# Patient Record
Sex: Male | Born: 1960 | Race: White | Hispanic: No | Smoking: Current some day smoker
Health system: Southern US, Community
[De-identification: ages and names within clinical notes are randomized; demographics above are authoritative.]

## PROBLEM LIST (undated history)

## (undated) DIAGNOSIS — Z87442 Personal history of urinary calculi: Secondary | ICD-10-CM

## (undated) DIAGNOSIS — E119 Type 2 diabetes mellitus without complications: Secondary | ICD-10-CM

## (undated) DIAGNOSIS — I6529 Occlusion and stenosis of unspecified carotid artery: Secondary | ICD-10-CM

## (undated) DIAGNOSIS — I2510 Atherosclerotic heart disease of native coronary artery without angina pectoris: Secondary | ICD-10-CM

## (undated) DIAGNOSIS — N400 Enlarged prostate without lower urinary tract symptoms: Secondary | ICD-10-CM

## (undated) DIAGNOSIS — I714 Abdominal aortic aneurysm, without rupture, unspecified: Secondary | ICD-10-CM

## (undated) DIAGNOSIS — I10 Essential (primary) hypertension: Secondary | ICD-10-CM

## (undated) DIAGNOSIS — I700 Atherosclerosis of aorta: Secondary | ICD-10-CM

## (undated) DIAGNOSIS — K760 Fatty (change of) liver, not elsewhere classified: Secondary | ICD-10-CM

## (undated) DIAGNOSIS — I771 Stricture of artery: Secondary | ICD-10-CM

## (undated) DIAGNOSIS — J449 Chronic obstructive pulmonary disease, unspecified: Secondary | ICD-10-CM

## (undated) DIAGNOSIS — N200 Calculus of kidney: Secondary | ICD-10-CM

## (undated) DIAGNOSIS — K5790 Diverticulosis of intestine, part unspecified, without perforation or abscess without bleeding: Secondary | ICD-10-CM

## (undated) DIAGNOSIS — K219 Gastro-esophageal reflux disease without esophagitis: Secondary | ICD-10-CM

## (undated) DIAGNOSIS — N529 Male erectile dysfunction, unspecified: Secondary | ICD-10-CM

## (undated) DIAGNOSIS — M1990 Unspecified osteoarthritis, unspecified site: Secondary | ICD-10-CM

## (undated) DIAGNOSIS — E785 Hyperlipidemia, unspecified: Secondary | ICD-10-CM

## (undated) DIAGNOSIS — I7143 Infrarenal abdominal aortic aneurysm, without rupture: Secondary | ICD-10-CM

## (undated) HISTORY — DX: Chronic obstructive pulmonary disease, unspecified: J44.9

## (undated) HISTORY — PX: VASECTOMY: SHX75

## (undated) HISTORY — PX: FACIAL COSMETIC SURGERY: SHX629

## (undated) HISTORY — DX: Occlusion and stenosis of unspecified carotid artery: I65.29

## (undated) HISTORY — DX: Hyperlipidemia, unspecified: E78.5

## (undated) HISTORY — PX: APPENDECTOMY: SHX54

## (undated) HISTORY — PX: CARDIAC CATHETERIZATION: SHX172

## (undated) HISTORY — DX: Essential (primary) hypertension: I10

## (undated) HISTORY — DX: Calculus of kidney: N20.0

## (undated) HISTORY — DX: Type 2 diabetes mellitus without complications: E11.9

## (undated) HISTORY — DX: Gastro-esophageal reflux disease without esophagitis: K21.9

## (undated) HISTORY — DX: Benign prostatic hyperplasia without lower urinary tract symptoms: N40.0

## (undated) HISTORY — PX: LITHOTRIPSY: SUR834

## (undated) HISTORY — PX: CORONARY ANGIOPLASTY: SHX604

---

## 2012-09-25 ENCOUNTER — Emergency Department: Payer: Self-pay | Admitting: Emergency Medicine

## 2012-09-25 LAB — CBC WITH DIFFERENTIAL/PLATELET
Basophil #: 0 10*3/uL (ref 0.0–0.1)
Basophil %: 0.2 %
Eosinophil %: 1.8 %
HCT: 55.7 % — ABNORMAL HIGH (ref 40.0–52.0)
Lymphocyte #: 1.3 10*3/uL (ref 1.0–3.6)
Lymphocyte %: 9.6 %
MCH: 31.5 pg (ref 26.0–34.0)
MCHC: 33.9 g/dL (ref 32.0–36.0)
MCV: 93 fL (ref 80–100)
Monocyte #: 0.5 x10 3/mm (ref 0.2–1.0)
Monocyte %: 3.5 %
Neutrophil #: 11.3 10*3/uL — ABNORMAL HIGH (ref 1.4–6.5)
Neutrophil %: 84.9 %
Platelet: 256 10*3/uL (ref 150–440)
RBC: 5.99 10*6/uL — ABNORMAL HIGH (ref 4.40–5.90)
RDW: 13.8 % (ref 11.5–14.5)

## 2012-09-25 LAB — COMPREHENSIVE METABOLIC PANEL
Albumin: 3.9 g/dL (ref 3.4–5.0)
Bilirubin,Total: 0.6 mg/dL (ref 0.2–1.0)
Co2: 26 mmol/L (ref 21–32)
EGFR (African American): >60
EGFR (Non-African Amer.): >60
Osmolality: 271 (ref 275–301)
Potassium: 3.7 mmol/L (ref 3.5–5.1)
SGOT(AST): 24 U/L (ref 15–37)
Total Protein: 8.2 g/dL (ref 6.4–8.2)

## 2012-09-25 LAB — URINALYSIS, COMPLETE
Bacteria: NONE SEEN
Glucose,UR: NEGATIVE mg/dL (ref 0–75)
Leukocyte Esterase: NEGATIVE
Nitrite: NEGATIVE
Ph: 5 (ref 4.5–8.0)
Protein: NEGATIVE
Specific Gravity: 1.02 (ref 1.003–1.030)
Squamous Epithelial: NONE SEEN

## 2013-12-01 DIAGNOSIS — E785 Hyperlipidemia, unspecified: Secondary | ICD-10-CM

## 2013-12-01 DIAGNOSIS — K219 Gastro-esophageal reflux disease without esophagitis: Secondary | ICD-10-CM

## 2013-12-01 DIAGNOSIS — N400 Enlarged prostate without lower urinary tract symptoms: Secondary | ICD-10-CM | POA: Insufficient documentation

## 2013-12-01 HISTORY — DX: Gastro-esophageal reflux disease without esophagitis: K21.9

## 2013-12-01 HISTORY — DX: Benign prostatic hyperplasia without lower urinary tract symptoms: N40.0

## 2013-12-01 HISTORY — DX: Hyperlipidemia, unspecified: E78.5

## 2014-03-25 DIAGNOSIS — J449 Chronic obstructive pulmonary disease, unspecified: Secondary | ICD-10-CM

## 2014-03-25 HISTORY — DX: Chronic obstructive pulmonary disease, unspecified: J44.9

## 2015-01-28 ENCOUNTER — Emergency Department
Admission: EM | Admit: 2015-01-28 | Discharge: 2015-01-28 | Disposition: A | Discharge status: Home / Self Care | Payer: BLUE CROSS/BLUE SHIELD | Attending: Emergency Medicine | Admitting: Emergency Medicine

## 2015-01-28 ENCOUNTER — Emergency Department: Payer: BLUE CROSS/BLUE SHIELD | Admitting: Anesthesiology

## 2015-01-28 ENCOUNTER — Encounter: Payer: Self-pay | Admitting: Emergency Medicine

## 2015-01-28 ENCOUNTER — Encounter
Admission: EM | Disposition: A | Discharge status: Home / Self Care | Payer: Self-pay | Source: Home / Self Care | Attending: Emergency Medicine

## 2015-01-28 ENCOUNTER — Emergency Department: Payer: BLUE CROSS/BLUE SHIELD

## 2015-01-28 DIAGNOSIS — R112 Nausea with vomiting, unspecified: Secondary | ICD-10-CM | POA: Insufficient documentation

## 2015-01-28 DIAGNOSIS — I714 Abdominal aortic aneurysm, without rupture: Secondary | ICD-10-CM | POA: Diagnosis not present

## 2015-01-28 DIAGNOSIS — F17210 Nicotine dependence, cigarettes, uncomplicated: Secondary | ICD-10-CM | POA: Insufficient documentation

## 2015-01-28 DIAGNOSIS — I739 Peripheral vascular disease, unspecified: Secondary | ICD-10-CM | POA: Insufficient documentation

## 2015-01-28 DIAGNOSIS — M25552 Pain in left hip: Secondary | ICD-10-CM

## 2015-01-28 DIAGNOSIS — K760 Fatty (change of) liver, not elsewhere classified: Secondary | ICD-10-CM | POA: Diagnosis not present

## 2015-01-28 DIAGNOSIS — F17200 Nicotine dependence, unspecified, uncomplicated: Secondary | ICD-10-CM | POA: Insufficient documentation

## 2015-01-28 DIAGNOSIS — I10 Essential (primary) hypertension: Secondary | ICD-10-CM | POA: Diagnosis not present

## 2015-01-28 DIAGNOSIS — N179 Acute kidney failure, unspecified: Secondary | ICD-10-CM | POA: Diagnosis not present

## 2015-01-28 DIAGNOSIS — N132 Hydronephrosis with renal and ureteral calculous obstruction: Secondary | ICD-10-CM | POA: Insufficient documentation

## 2015-01-28 DIAGNOSIS — Z87442 Personal history of urinary calculi: Secondary | ICD-10-CM | POA: Insufficient documentation

## 2015-01-28 DIAGNOSIS — N200 Calculus of kidney: Secondary | ICD-10-CM | POA: Diagnosis not present

## 2015-01-28 DIAGNOSIS — R109 Unspecified abdominal pain: Secondary | ICD-10-CM | POA: Insufficient documentation

## 2015-01-28 DIAGNOSIS — N201 Calculus of ureter: Secondary | ICD-10-CM

## 2015-01-28 DIAGNOSIS — E785 Hyperlipidemia, unspecified: Secondary | ICD-10-CM | POA: Insufficient documentation

## 2015-01-28 HISTORY — DX: Essential (primary) hypertension: I10

## 2015-01-28 HISTORY — PX: CYSTOSCOPY W/ URETERAL STENT PLACEMENT: SHX1429

## 2015-01-28 HISTORY — DX: Abdominal aortic aneurysm, without rupture, unspecified: I71.40

## 2015-01-28 HISTORY — DX: Abdominal aortic aneurysm, without rupture: I71.4

## 2015-01-28 LAB — COMPREHENSIVE METABOLIC PANEL
ALK PHOS: 32 U/L — AB (ref 38–126)
ALT: 29 U/L (ref 17–63)
ANION GAP: 9 (ref 5–15)
AST: 25 U/L (ref 15–41)
Albumin: 3.9 g/dL (ref 3.5–5.0)
BILIRUBIN TOTAL: 0.5 mg/dL (ref 0.3–1.2)
BUN: 24 mg/dL — AB (ref 6–20)
CHLORIDE: 102 mmol/L (ref 101–111)
CO2: 23 mmol/L (ref 22–32)
Calcium: 8.8 mg/dL — ABNORMAL LOW (ref 8.9–10.3)
Creatinine, Ser: 2.43 mg/dL — ABNORMAL HIGH (ref 0.61–1.24)
GFR, EST AFRICAN AMERICAN: 33 mL/min — AB (ref >60)
GFR, EST NON AFRICAN AMERICAN: 29 mL/min — AB (ref >60)
GLUCOSE: 113 mg/dL — AB (ref 65–99)
POTASSIUM: 3.9 mmol/L (ref 3.5–5.1)
Sodium: 134 mmol/L — ABNORMAL LOW (ref 135–145)
TOTAL PROTEIN: 7.3 g/dL (ref 6.5–8.1)

## 2015-01-28 LAB — CBC WITH DIFFERENTIAL/PLATELET
Basophils Absolute: 0.1 10*3/uL (ref 0–0.1)
Basophils Relative: 1 %
EOS ABS: 0.3 10*3/uL (ref 0–0.7)
Eosinophils Relative: 2 %
HCT: 43.9 % (ref 40.0–52.0)
Hemoglobin: 14.7 g/dL (ref 13.0–18.0)
LYMPHS ABS: 2.5 10*3/uL (ref 1.0–3.6)
Lymphocytes Relative: 17 %
MCH: 31.1 pg (ref 26.0–34.0)
MCHC: 33.5 g/dL (ref 32.0–36.0)
MCV: 92.8 fL (ref 80.0–100.0)
MONO ABS: 1.3 10*3/uL — AB (ref 0.2–1.0)
MONOS PCT: 9 %
NEUTROS ABS: 10.9 10*3/uL — AB (ref 1.4–6.5)
NEUTROS PCT: 71 %
Platelets: 312 10*3/uL (ref 150–440)
RBC: 4.74 MIL/uL (ref 4.40–5.90)
RDW: 13.5 % (ref 11.5–14.5)
WBC: 15.1 10*3/uL — AB (ref 3.8–10.6)

## 2015-01-28 LAB — URINALYSIS COMPLETE WITH MICROSCOPIC (ARMC ONLY)
Bacteria, UA: NONE SEEN
Bilirubin Urine: NEGATIVE
Glucose, UA: NEGATIVE mg/dL
Hgb urine dipstick: NEGATIVE
KETONES UR: NEGATIVE mg/dL
Leukocytes, UA: NEGATIVE
Nitrite: NEGATIVE
PH: 5 (ref 5.0–8.0)
Protein, ur: NEGATIVE mg/dL
SQUAMOUS EPITHELIAL / LPF: NONE SEEN
Specific Gravity, Urine: 1.016 (ref 1.005–1.030)

## 2015-01-28 SURGERY — CYSTOSCOPY, WITH RETROGRADE PYELOGRAM AND URETERAL STENT INSERTION
Anesthesia: General | Site: Ureter | Laterality: Left | Wound class: Dirty or Infected

## 2015-01-28 MED ORDER — MORPHINE SULFATE 4 MG/ML IJ SOLN
4.0000 mg | Freq: Once | INTRAMUSCULAR | Status: AC
  Administered 2015-01-28: 4 mg via INTRAVENOUS

## 2015-01-28 MED ORDER — KETOROLAC TROMETHAMINE 30 MG/ML IJ SOLN
INTRAMUSCULAR | Status: DC | PRN
  Administered 2015-01-28: 30 mg via INTRAVENOUS

## 2015-01-28 MED ORDER — CEFTRIAXONE SODIUM IN DEXTROSE 20 MG/ML IV SOLN: 1.0000 g | Freq: Once | INTRAVENOUS | Status: DC

## 2015-01-28 MED ORDER — SENNOSIDES-DOCUSATE SODIUM 8.6-50 MG PO TABS: 1.0000 | ORAL_TABLET | Freq: Two times a day (BID) | ORAL | Status: DC

## 2015-01-28 MED ORDER — ONDANSETRON HCL 4 MG/2ML IJ SOLN
INTRAMUSCULAR | Status: DC | PRN
  Administered 2015-01-28: 4 mg via INTRAVENOUS

## 2015-01-28 MED ORDER — FENTANYL CITRATE (PF) 100 MCG/2ML IJ SOLN: 25.0000 ug | INTRAMUSCULAR | Status: DC | PRN

## 2015-01-28 MED ORDER — MIDAZOLAM HCL 2 MG/2ML IJ SOLN
INTRAMUSCULAR | Status: DC | PRN
  Administered 2015-01-28: 2 mg via INTRAVENOUS

## 2015-01-28 MED ORDER — ONDANSETRON HCL 4 MG/2ML IJ SOLN
INTRAMUSCULAR | Status: AC
  Administered 2015-01-28: 4 mg via INTRAVENOUS
  Filled 2015-01-28: qty 2

## 2015-01-28 MED ORDER — LIDOCAINE HCL (CARDIAC) 20 MG/ML IV SOLN
INTRAVENOUS | Status: DC | PRN
Start: 2015-01-28 — End: 2015-01-28
  Administered 2015-01-28: 30 mg via INTRAVENOUS

## 2015-01-28 MED ORDER — OXYCODONE-ACETAMINOPHEN 5-325 MG PO TABS: 1.0000 | ORAL_TABLET | Freq: Four times a day (QID) | ORAL | Status: DC | PRN

## 2015-01-28 MED ORDER — LACTATED RINGERS IV SOLN
INTRAVENOUS | Status: DC | PRN
  Administered 2015-01-28: 17:00:00 via INTRAVENOUS

## 2015-01-28 MED ORDER — ONDANSETRON HCL 4 MG/2ML IJ SOLN
4.0000 mg | Freq: Once | INTRAMUSCULAR | Status: AC
  Administered 2015-01-28: 4 mg via INTRAVENOUS

## 2015-01-28 MED ORDER — SODIUM CHLORIDE 0.9 % IV BOLUS (SEPSIS)
1000.0000 mL | Freq: Once | INTRAVENOUS | Status: AC
  Administered 2015-01-28: 1000 mL via INTRAVENOUS

## 2015-01-28 MED ORDER — PROPOFOL 10 MG/ML IV BOLUS
INTRAVENOUS | Status: DC | PRN
  Administered 2015-01-28: 160 mg via INTRAVENOUS

## 2015-01-28 MED ORDER — CEFTRIAXONE SODIUM 1 G IJ SOLR
INTRAMUSCULAR | Status: AC
  Filled 2015-01-28: qty 10

## 2015-01-28 MED ORDER — ONDANSETRON HCL 4 MG PO TABS: 4.0000 mg | ORAL_TABLET | Freq: Three times a day (TID) | ORAL | Status: DC | PRN

## 2015-01-28 MED ORDER — MORPHINE SULFATE 4 MG/ML IJ SOLN
INTRAMUSCULAR | Status: AC
  Administered 2015-01-28: 4 mg via INTRAVENOUS
  Filled 2015-01-28: qty 1

## 2015-01-28 MED ORDER — FENTANYL CITRATE (PF) 100 MCG/2ML IJ SOLN
INTRAMUSCULAR | Status: DC | PRN
  Administered 2015-01-28: 100 ug via INTRAVENOUS

## 2015-01-28 MED ORDER — CEFTRIAXONE SODIUM 1 G IJ SOLR
1.0000 g | INTRAMUSCULAR | Status: DC | PRN
  Administered 2015-01-28: 1 g via INTRAVENOUS

## 2015-01-28 MED ORDER — ONDANSETRON HCL 4 MG/2ML IJ SOLN: 4.0000 mg | Freq: Once | INTRAMUSCULAR | Status: DC | PRN

## 2015-01-28 SURGICAL SUPPLY — 24 items
BAG DRAIN CYSTO-URO LG1000N (MISCELLANEOUS) ×4 IMPLANT
CATH URETL OPEN END 6X70 (CATHETERS) ×4 IMPLANT
CONRAY 43 FOR UROLOGY 50M (MISCELLANEOUS) ×4 IMPLANT
GLOVE BIO SURGEON STRL SZ7 (GLOVE) ×8 IMPLANT
GLOVE BIO SURGEON STRL SZ7.5 (GLOVE) ×4 IMPLANT
GOWN STRL REUS W/ TWL LRG LVL3 (GOWN DISPOSABLE) ×4 IMPLANT
GOWN STRL REUS W/ TWL XL LVL3 (GOWN DISPOSABLE) ×2 IMPLANT
GOWN STRL REUS W/TWL LRG LVL3 (GOWN DISPOSABLE) ×4
GOWN STRL REUS W/TWL XL LVL3 (GOWN DISPOSABLE) ×2
JELLY LUB 2OZ STRL (MISCELLANEOUS) ×2
JELLY LUBE 2OZ STRL (MISCELLANEOUS) ×2 IMPLANT
KIT RM TURNOVER CYSTO AR (KITS) ×4 IMPLANT
PACK CYSTO AR (MISCELLANEOUS) ×4 IMPLANT
PREP PVP WINGED SPONGE (MISCELLANEOUS) ×4 IMPLANT
SENSORWIRE 0.038 NOT ANGLED (WIRE) ×4
SOL .9 NS 3000ML IRR  AL (IV SOLUTION) ×2
SOL .9 NS 3000ML IRR UROMATIC (IV SOLUTION) ×2 IMPLANT
STENT URET 6FRX26 CONTOUR (STENTS) ×4 IMPLANT
SYRINGE 10CC LL (SYRINGE) ×4 IMPLANT
SYRINGE IRR TOOMEY STRL 70CC (SYRINGE) ×4 IMPLANT
TUBE FEED INF 8F (TUBING) IMPLANT
WATER STERILE IRR 1000ML POUR (IV SOLUTION) ×4 IMPLANT
WIRE SENSOR 0.038 NOT ANGLED (WIRE) ×2 IMPLANT
contour stent 6frx26cm ×4 IMPLANT

## 2015-01-28 NOTE — Anesthesia Preprocedure Evaluation (Addendum)
Anesthesia Evaluation  Patient identified by MRN, date of birth, ID band Patient awake    Reviewed: Allergy & Precautions, NPO status , Patient's Chart, lab work & pertinent test results, reviewed documented beta blocker date and time   Airway Mallampati: II  TM Distance: >3 FB     Dental  (+) Chipped, Poor Dentition   Pulmonary Current Smoker,          Cardiovascular hypertension, + Peripheral Vascular Disease     Neuro/Psych    GI/Hepatic   Endo/Other    Renal/GU Renal InsufficiencyRenal disease     Musculoskeletal   Abdominal   Peds  Hematology   Anesthesia Other Findings Pt has been NPO for 9 hrs. Feels hungry at present. Does not feel nauseated. LMA should be safe.  Reproductive/Obstetrics                            Anesthesia Physical Anesthesia Plan  ASA: III  Anesthesia Plan: General   Post-op Pain Management:    Induction: Intravenous  Airway Management Planned: LMA  Additional Equipment:   Intra-op Plan:   Post-operative Plan:   Informed Consent: I have reviewed the patients History and Physical, chart, labs and discussed the procedure including the risks, benefits and alternatives for the proposed anesthesia with the patient or authorized representative who has indicated his/her understanding and acceptance.     Plan Discussed with: CRNA  Anesthesia Plan Comments:        Anesthesia Quick Evaluation

## 2015-01-28 NOTE — OR Nursing (Signed)
Patient in phase two of recovery care

## 2015-01-28 NOTE — ED Notes (Signed)
Pt sleeping soundly - awaiting admit bed

## 2015-01-28 NOTE — Discharge Instructions (Signed)
1 - You may have urinary urgency (bladder spasms) and bloody urine on / off with stent in place. This is normal. ° °2 - Call MD or go to ER for fever >102, severe pain / nausea / vomiting not relieved by medications, or acute change in medical status ° °

## 2015-01-28 NOTE — Brief Op Note (Signed)
01/28/2015  5:02 PM  PATIENT:  [name]  54 y.o. male  PRE-OPERATIVE DIAGNOSIS:  left stone, renal failure  POST-OPERATIVE DIAGNOSIS:  same  PROCEDURE:  Procedure(s): CYSTOSCOPY WITH RETROGRADE PYELOGRAM/URETERAL STENT PLACEMENT (Left)  SURGEON:  Surgeon(s) and Role:    * Alexis Frock, MD - Primary  PHYSICIAN ASSISTANT:   ASSISTANTS: none   ANESTHESIA:   general  EBL:  Total I/O In: 350 [I.V.:350] Out: -   BLOOD ADMINISTERED:none  DRAINS: none   LOCAL MEDICATIONS USED:  NONE  SPECIMEN:  No Specimen  DISPOSITION OF SPECIMEN:  N/A  COUNTS:  YES  TOURNIQUET:  * No tourniquets in log *  DICTATION: .Note written in EPIC  PLAN OF CARE: Discharge to home after PACU  PATIENT DISPOSITION:  PACU - hemodynamically stable.   Delay start of Pharmacological VTE agent (>24hrs) due to surgical blood loss or risk of bleeding: not applicable

## 2015-01-28 NOTE — ED Provider Notes (Signed)
[facility] Emergency Department Provider Note  Time seen: 11:59 AM  I have reviewed the triage vital signs and the nursing notes.   HISTORY  Chief Complaint Flank Pain    HPI [name] is a 54 y.o. male with a past medical history of hypertension, AAA, kidney stone in 1993, presents the emergency department with 3 weeks of left flank pain. According to the patient for the past 3 weeks he has been dealing with a fairly constant left flank pain. Denies dysuria, hematuria or fever. He saw his primary care doctor 2 weeks ago for the same issue, was told it was likely a kidney stone as she had trace blood in his urine. Patient was told to drink plenty of fluids. Patient states he has been drinking plenty of fluids that the pain has not resolved, if anything it has worsened. Patient came to the emergency department for further evaluation. Describes his pain currently is a 7/10, moderate, sharp in the left flank radiating to the left groin. No modifying factors identified. Patient does take considerable nausea this morning but denies vomiting.Of note the patient also has a known abdominal aortic aneurysm which they are currently monitoring, but per the patient it does not require an intervention.     Past Medical History  Diagnosis Date  . Hypertension   . AAA (abdominal aortic aneurysm)     There are no active problems to display for this patient.   Past Surgical History  Procedure Laterality Date  . Lithotripsy    . Facial cosmetic surgery    . Appendectomy      No current outpatient prescriptions on file.  Allergies Review of patient's allergies indicates no known allergies.  No family history on file.  Social History History  Substance Use Topics  . Smoking status: Current Every Day Smoker    Types: Cigarettes  . Smokeless tobacco: Not on file  . Alcohol Use: No    Review of Systems Constitutional: Negative for fever. Cardiovascular:  Negative for chest pain. Respiratory: Negative for shortness of breath. Gastrointestinal: Positive for left flank pain and nausea. Negative for vomiting or diarrhea. Genitourinary: Negative for dysuria. Negative for hematuria. Musculoskeletal: Positive for left back pain. 10-point ROS otherwise negative.  ____________________________________________   PHYSICAL EXAM:  VITAL SIGNS: ED Triage Vitals  Enc Vitals Group     BP 01/28/15 1148 131/90 mmHg     Pulse Rate 01/28/15 1148 100     Resp 01/28/15 1148 22     Temp 01/28/15 1148 98.6 F (37 C)     Temp Source 01/28/15 1148 Oral     SpO2 01/28/15 1148 99 %     Weight 01/28/15 1148 220 lb (99.791 kg)     Height 01/28/15 1148 5\' 8"  (1.727 m)     Head Cir --      Peak Flow --      Pain Score 01/28/15 1150 8     Pain Loc --      Pain Edu? --      Excl. in [place]? --     Constitutional: Alert and oriented. Well appearing and in no distress. Eyes: Normal exam ENT   Mouth/Throat: Mucous membranes are moist. Cardiovascular: Normal rate, regular rhythm. No murmur Respiratory: Normal respiratory effort without tachypnea nor retractions. Breath sounds are clear and equal bilaterally. No wheezes/rales/rhonchi. Gastrointestinal: Soft and nontender. No distention.  There is no CVA tenderness. Musculoskeletal: Nontender with normal range of motion in all extremities.  Neurologic:  Normal speech and language. No gross focal neurologic deficits Skin:  Skin is warm, dry and intact.  Psychiatric: Mood and affect are normal. Speech and behavior are normal.   ____________________________________________    INITIAL IMPRESSION / ASSESSMENT AND PLAN / ED COURSE  Pertinent labs & imaging results that were available during my care of the patient were reviewed by me and considered in my medical decision making (see chart for details).  Patient with 3 weeks of left flank pain, he describes his symptoms as "kidney pain." States this feels like  his prior kidney stone in 1993 which required a procedure to remove the stone. Patient also has a history of a AAA which they're monitoring. We will check labs, and a CT scan to evaluate for ureteral lithiasis. We will treat patient's pain and nausea, and IV hydrate while awaiting results.  Urinalysis appears largely within normal limits. Creatinine has increased some 0.9 to 2.4. I discussed the patient with urology, they'll be coming in to stent the patient. I discussed this with the patient who wishes to have the stent performed today given his acute increase in pain and nausea today.  ____________________________________________   FINAL CLINICAL IMPRESSION(S) / ED DIAGNOSES  Left flank pain Ureterolithiasis Acute renal insufficiency   Harvest Dark, MD 01/28/15 1512

## 2015-01-28 NOTE — ED Notes (Signed)
Pt here with c/o left flank pain that radiates into left side of abdomen, hx of kidney stones in 1993. Pt has been having this pain x3 weeks, talked to PCP on Thursday, given rx for oxycodone 5/325 with no relief. Pt with hx of HTN and small AAA.

## 2015-01-28 NOTE — ED Notes (Signed)
Patient transported to CT 

## 2015-01-28 NOTE — Transfer of Care (Signed)
Immediate Anesthesia Transfer of Care Note  Patient: [name]  Procedure(s) Performed: Procedure(s): CYSTOSCOPY WITH RETROGRADE PYELOGRAM/URETERAL STENT PLACEMENT (Left)  Patient Location: PACU  Anesthesia Type:General  Level of Consciousness: awake, alert  and oriented  Airway & Oxygen Therapy: Patient Spontanous Breathing and Patient connected to face mask oxygen  Post-op Assessment: Report given to RN and Post -op Vital signs reviewed and stable  Post vital signs: Reviewed  Last Vitals:  Filed Vitals:   01/28/15 1430  BP: 103/69  Pulse: 85  Temp:   Resp:     Complications: No apparent anesthesia complications

## 2015-01-28 NOTE — Anesthesia Postprocedure Evaluation (Signed)
  Anesthesia Post-op Note  Patient: [name]  Procedure(s) Performed: Procedure(s): CYSTOSCOPY WITH RETROGRADE PYELOGRAM/URETERAL STENT PLACEMENT (Left)  Anesthesia type:General  Patient location: PACU  Post pain: Pain level controlled. Pt feeling well.  Post assessment: Post-op Vital signs reviewed, Patient's Cardiovascular Status Stable, Respiratory Function Stable, Patent Airway and No signs of Nausea or vomiting  Post vital signs: Reviewed and stable  Last Vitals:  Filed Vitals:   01/28/15 1807  BP:   Pulse: 100  Temp: 37.1 C  Resp:     Level of consciousness: awake, alert  and patient cooperative  Complications: No apparent anesthesia complications

## 2015-01-28 NOTE — H&P (Signed)
HISTORY AND PHYSICAL  3:39 PM   [name] Sep 08, 1960 [id number]  Referring provider: No referring provider defined for this encounter.  Chief Complaint  Patient presents with  . Flank Pain    HPI:   1 - Left Ureteral Stone with Hydronephrosis and Refracotry Pain, Bilateral Renal Stones - left 5x41mm mid ureteral stone with mod hdyro by ER CT 02/20/43 on eval colickly left flank pain that is now severe and with nausea / emesis refractory to several rounds of IV pain and nausea meds. Stone 66mm, SSD 12cm, 675HU. There is somewhat calcified AAA nearby. Also ipsilateral 47mm upper poel x 2 and 30mm lower pole on obatructing and contralateras 61mm lwoer pole.    2 - Acute Renal Failure - Cr 2.45 by ER labs, baseline Cr <1. No hyperkalemia. Admits to nausea with emesis x fe days with poor PO intake and left ureteral atone with unilateral hydro as per above. Approx normal appearing contarlateral kidney.  PMH sig for AAA (<5cm, on surveillance), HLD. Non-diabetic. Prior surgeries inclued ureteroscopy 1993, appy. No strong blood thinners.  Today "[name]" is seen as ER consult / eval for above. Last meal >7hrs ago and acutally threw that up.     PMH: Past Medical History  Diagnosis Date  . Hypertension   . AAA (abdominal aortic aneurysm)     Surgical History: Past Surgical History  Procedure Laterality Date  . Lithotripsy    . Facial cosmetic surgery    . Appendectomy      Home Medications:    Medication List    ASK your doctor about these medications        atorvastatin 10 MG tablet  Commonly known as:  LIPITOR  Take 10 mg by mouth every morning.     COMBIVENT RESPIMAT 20-100 MCG/ACT Aers respimat  Generic drug:  Ipratropium-Albuterol  Inhale 1 puff into the lungs 4 (four) times daily as needed.     fenofibrate 145 MG tablet  Commonly known as:  TRICOR  Take 145 mg by mouth daily.     lisinopril 10 MG tablet  Commonly known as:  PRINIVIL,ZESTRIL  Take 10 mg by  mouth daily.     oxyCODONE-acetaminophen 5-325 MG per tablet  Commonly known as:  PERCOCET/ROXICET  Take 1 tablet by mouth every 6 (six) hours as needed. For pain.     tamsulosin 0.4 MG Caps capsule  Commonly known as:  FLOMAX  Take 0.4 mg by mouth daily.        Allergies: No Known Allergies  Family History: No family history on file.  Social History:  reports that he has been smoking Cigarettes.  He does not have any smokeless tobacco history on file. He reports that he does not drink alcohol. His drug history is not on file.   Review of Systems   Urological Symptom Review  Left flank pain   Review of Systems  Gastrointestinal (upper)  : Nausea Vomiting  Gastrointestinal (lower) : Negative for lower GI symptoms  Constitutional : Negative for symptoms  Skin: Negative for skin symptoms  Eyes: Negative for eye symptoms  Ear/Nose/Throat : Negative for Ear/Nose/Throat symptoms  Hematologic/Lymphatic: Negative for Hematologic/Lymphatic symptoms  Cardiovascular : Negative for cardiovascular symptoms  Respiratory : Negative for respiratory symptoms  Endocrine: Negative for endocrine symptoms  Musculoskeletal: Negative for musculoskeletal symptoms  Neurological: Negative for neurological symptoms  Psychologic: Negative for psychiatric symptoms    Physical Exam: BP 103/69 mmHg  Pulse 85  Temp(Src) 98.6  F (37 C) (Oral)  Resp 22  Ht 5\' 8"  (1.727 m)  Wt 220 lb (99.791 kg)  BMI 33.46 kg/m2  SpO2 90%  Constitutional:  Alert and oriented, No acute distress. HEENT: [place] AT, moist mucus membranes.  Trachea midline, no masses. Cardiovascular: No clubbing, cyanosis, or edema. Respiratory: Normal respiratory effort, no increased work of breathing. GI: Abdomen is soft, nontender, nondistended, no abdominal masses GU: Left CVAT with radiation to groin.  Skin: No rashes, bruises or suspicious lesions. Lymph: No cervical or inguinal  adenopathy. Neurologic: Grossly intact, no focal deficits, moving all 4 extremities. Psychiatric: Normal mood and affect.  Laboratory Data: Lab Results  Component Value Date   WBC 15.1* 01/28/2015   HGB 14.7 01/28/2015   HCT 43.9 01/28/2015   MCV 92.8 01/28/2015   PLT 312 01/28/2015    Lab Results  Component Value Date   CREATININE 2.43* 01/28/2015    No results found for: PSA  No results found for: TESTOSTERONE  No results found for: HGBA1C  Urinalysis    Component Value Date/Time   COLORURINE YELLOW* 01/28/2015 1353   APPEARANCEUR CLEAR* 01/28/2015 1353   LABSPEC 1.016 01/28/2015 1353   PHURINE 5.0 01/28/2015 1353   GLUCOSEU NEGATIVE 01/28/2015 1353   HGBUR NEGATIVE 01/28/2015 1353   BILIRUBINUR NEGATIVE 01/28/2015 1353   KETONESUR NEGATIVE 01/28/2015 1353   PROTEINUR NEGATIVE 01/28/2015 1353   NITRITE NEGATIVE 01/28/2015 1353   LEUKOCYTESUR NEGATIVE 01/28/2015 1353    Pertinent Imaging: CT stone sudty reveiwed indepentantly with findings as per HPI  Assessment & Plan:    1 - Left Ureteral Stone with Hydronephrosis and Refracotry Pain, Bilateral Renal Stones - discussed options of medical therapy with elective URS v. Cysto / stent today with goal of renal decompression and pain / nausea control and he opts for latter. Will plan for ureteroscopy / laser lithotripsy for goal of stone free on left in elective setting.  He understands he will require minimum two procedures.  Risks, benefits, alternatives discussed.   2 - Acute Renal Failure - likely combinatino of pre-renal dehydration with unilateral obstruction. As normal contralateral kidney and no hyperkalemia I do not feel admission / serial labs warranted. He is advised to hold lisinopril for time being.  Will proceed with urgent surgery today ( cysto, left retrograde / left ureteral stent placement) and likely DC home after pending PACU recovery.     [name], [name]  [name] [address] [place],  [address] [phone]

## 2015-01-28 NOTE — Op Note (Signed)
PROCEDURE: Cysto, left retrograde, left ureteral stent placement  SURGEON: Rulon Abide MD  INDICATION: Left ureteral stone with hydronephrosis, refractory colic and emesis and acute renal failure.  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  NARRATIVE:  After induction of LMA anesthesia pt placed in low lithotomy position and prepped with iodine prep to penis, perineum, proximal thighs.  Cystourethrsocopy perforemd with 12F cystoscopte with 30 degress lens. Anterior and posteriro urethra unremarkable. Bladder with clear urien and small wide mouth Rt wall divertiusulum 2cm diameter. Bileteral UO's anatomic. Left UO cannulated with 49F open ended and retrogarde obtained:  Regtrograed demostrated single left ureter / kidney with mod hydro to filling defet in distal 1/3 of ureter c/w stone. Marland Kitchen038 sensor wire advanced to upper pole over which 6x25 contour stent placed with prox coil renal pelvis, distal end urianry bladder. Efflux copious urine through and around distal stent. Bladder emptied per cystoscope and pt taken to PACU in stable condition.

## 2015-01-30 ENCOUNTER — Encounter: Payer: Self-pay | Admitting: Urology

## 2015-01-31 ENCOUNTER — Other Ambulatory Visit: Payer: BLUE CROSS/BLUE SHIELD

## 2015-02-02 ENCOUNTER — Ambulatory Visit (INDEPENDENT_AMBULATORY_CARE_PROVIDER_SITE_OTHER): Payer: BLUE CROSS/BLUE SHIELD | Admitting: Urology

## 2015-02-02 ENCOUNTER — Encounter: Payer: Self-pay | Admitting: Urology

## 2015-02-02 VITALS — BP 105/65 | HR 107 | Ht 69.0 in | Wt 216.6 lb

## 2015-02-02 DIAGNOSIS — N201 Calculus of ureter: Secondary | ICD-10-CM | POA: Diagnosis not present

## 2015-02-02 DIAGNOSIS — N179 Acute kidney failure, unspecified: Secondary | ICD-10-CM

## 2015-02-02 DIAGNOSIS — N200 Calculus of kidney: Secondary | ICD-10-CM

## 2015-02-02 LAB — URINALYSIS, COMPLETE
BILIRUBIN UA: NEGATIVE
Glucose, UA: NEGATIVE
Nitrite, UA: NEGATIVE
PH UA: 5 (ref 5.0–7.5)
Specific Gravity, UA: >1.03 — ABNORMAL HIGH (ref 1.005–1.030)
UUROB: 0.2 mg/dL (ref 0.2–1.0)

## 2015-02-02 LAB — MICROSCOPIC EXAMINATION

## 2015-02-02 LAB — BLADDER SCAN AMB NON-IMAGING: Scan Result: 59

## 2015-02-02 NOTE — Progress Notes (Signed)
02/02/2015 1:51 PM   [name] [birth year] [id number]  Referring provider: Sofie Hartigan, MD [place], [place] [address]  Chief Complaint  Patient presents with  . Pre-op Exam    L ureteral stone, L ureteroscopy w/ Holmium laser stent exchange    HPI: 54 year old male who presents today after undergoing left ureteral stent placement on 01/28/2015 by Dr. Tresa Moore.  He presented earlier that day to the ER with left flank pain, nausea and vomiting at which time he was noted to have a 10 x 5 mm left mid ureteral stone with moderate hydronephrosis, as well as other left-sided nonobstructing stones including a 1 cm stone in the left lower pole. He also has some nonobstructing right-sided stones.  On presentation to the ER, he was also noted to have acute renal failure with a creatinine of 2.45, leukocytosis with a WBC of 15.1, and a negative urinalysis.  He tolerated the stent placement without difficulty and was discharged home later that day.  He returns to the office to discuss definitive management of the stone. Of note, he does have a previous history of this, underwent ureteroscopy approximately 25 years ago.  He's not had treatment or follow-up for his stone since that time.  He has been doing well with the stent has otherwise no complaints today. No fevers or chills, no nausea or vomiting.   PMH: Past Medical History  Diagnosis Date  . Hypertension   . AAA (abdominal aortic aneurysm)   . Renal stones     Surgical History: Past Surgical History  Procedure Laterality Date  . Lithotripsy    . Facial cosmetic surgery    . Appendectomy    . Cystoscopy w/ ureteral stent placement Left 01/28/2015    Procedure: CYSTOSCOPY WITH RETROGRADE PYELOGRAM/URETERAL STENT PLACEMENT;  Surgeon: Alexis Frock, MD;  Location: [facility] ORS;  Service: Urology;  Laterality: Left;    Home Medications:    Medication List    Notice    This visit is during an admission. Changes  to the med list made in this visit will be reflected in the After Visit Summary of the admission.      Allergies: No Known Allergies  Family History: History reviewed. No pertinent family history.  Social History:  reports that he has been smoking Cigarettes.  He has a 300 pack-year smoking history. He does not have any smokeless tobacco history on file. He reports that he does not drink alcohol or use illicit drugs.   Review of Systems UROLOGY Frequent Urination?: Yes Hard to postpone urination?: No Burning/pain with urination?: No Get up at night to urinate?: Yes Leakage of urine?: No Urine stream starts and stops?: Yes Trouble starting stream?: No Do you have to strain to urinate?: No Blood in urine?: No Urinary tract infection?: No Sexually transmitted disease?: No Injury to kidneys or bladder?: No Painful intercourse?: No Weak stream?: Yes Erection problems?: Yes Penile pain?: No Gastrointestinal Nausea?: No Vomiting?: No Indigestion/heartburn?: No Diarrhea?: No Constipation?: No Constitutional Fever: No Night sweats?: No Weight loss?: No Fatigue?: No Skin Skin rash/lesions?: No Itching?: No Eyes Blurred vision?: No Double vision?: No Ears/Nose/Throat Sore throat?: No Sinus problems?: No Hematologic/Lymphatic Swollen glands?: No Easy bruising?: No Cardiovascular Leg swelling?: No Chest pain?: No Respiratory Cough?: No Shortness of breath?: No Endocrine Excessive thirst?: No Musculoskeletal Back pain?: Yes Joint pain?: Yes Neurological Headaches?: No Dizziness?: No Psychologic Depression?: No Anxiety?: No   Physical Exam: BP 105/65 mmHg  Pulse 107  Ht 5\' 9"  (1.753 m)  Wt 216 lb 9.6 oz (98.249 kg)  BMI 31.97 kg/m2  Constitutional:  Alert and oriented, No acute distress. HEENT: [place] AT, moist mucus membranes.  Trachea midline, no masses. Cardiovascular: No clubbing, cyanosis, or edema. RRR. Respiratory: Normal respiratory effort, no  increased work of breathing.  CTAB. GI: Abdomen is soft, nontender, nondistended, no abdominal masses GU: No CVA tenderness.  Skin: No rashes, bruises or suspicious lesions. Lymph: No cervical or inguinal adenopathy. Neurologic: Grossly intact, no focal deficits, moving all 4 extremities. Psychiatric: Normal mood and affect.  Laboratory Data: Lab Results  Component Value Date   WBC 15.1* 01/28/2015   HGB 14.7 01/28/2015   HCT 43.9 01/28/2015   MCV 92.8 01/28/2015   PLT 312 01/28/2015    Lab Results  Component Value Date   CREATININE 2.43* 01/28/2015   Urinalysis    Component Value Date/Time   COLORURINE YELLOW* 01/28/2015 1353   APPEARANCEUR CLEAR* 01/28/2015 1353   LABSPEC 1.016 01/28/2015 1353   PHURINE 5.0 01/28/2015 1353   GLUCOSEU Negative 02/02/2015 1100   HGBUR NEGATIVE 01/28/2015 1353   BILIRUBINUR Negative 02/02/2015 1100   BILIRUBINUR NEGATIVE 01/28/2015 1353   KETONESUR NEGATIVE 01/28/2015 1353   PROTEINUR NEGATIVE 01/28/2015 1353   NITRITE Negative 02/02/2015 1100   NITRITE NEGATIVE 01/28/2015 1353   LEUKOCYTESUR Trace* 02/02/2015 1100   LEUKOCYTESUR NEGATIVE 01/28/2015 1353    Pertinent Imaging: CLINICAL DATA: Left flank and abdominal pain for 3 weeks. History of urinary calculi.  EXAM: CT ABDOMEN AND PELVIS WITHOUT CONTRAST  TECHNIQUE: Multidetector CT imaging of the abdomen and pelvis was performed following the standard protocol without IV contrast.  COMPARISON: None.  FINDINGS: Please note that parenchymal abnormalities may be missed without intravenous contrast.  Lower chest: Mild bibasilar scarring noted.  Hepatobiliary: Diffuse hepatic steatosis noted. No focal hepatic abnormalities are identified. Gallbladder is unremarkable. There is no evidence of biliary dilatation.  Pancreas: Unremarkable  Spleen: Unremarkable  Adrenals/Urinary Tract: A 5 x 10 mm mid-distal left ureteral calculus is noted causing moderate left  hydroureteronephrosis.  There are multiple nonobstructing bilateral renal calculi-that nonobstructing bilateral renal calculi are identified including a a 6 x 10 mm mid right ureteral calculus, 2 separate 3 mm left upper pole renal calculi and a 6 x 10 mm left lower pole renal calculus.  The adrenal glands and bladder are unremarkable.  Stomach/Bowel: Colonic diverticulosis noted without diverticulitis. There is no evidence of bowel obstruction or focal bowel wall thickening.  Vascular/Lymphatic: A 3.8 cm infrarenal suprailiac abdominal aortic aneurysm is identified measuring 5 cm in length. Aortic atherosclerotic calcifications are noted. No enlarged lymph nodes are identified.  Reproductive: Prostate unremarkable.  Other: No free fluid, pneumoperitoneum or abscess.  Musculoskeletal: No acute or suspicious abnormalities.  IMPRESSION: 5 x 10 mm mid -distal left ureteral calculus causing moderate left hydroureteronephrosis.  3.8 cm infrarenal suprailiac abdominal aortic aneurysm. Recommend followup by ultrasound in 2 years. This recommendation follows ACR consensus guidelines: White Paper of the ACR Incidental Findings Committee II on Vascular Findings. J Am Coll Radiol 2013; 10:789-794.  Nonobstructing bilateral renal calculi.  Hepatic steatosis.   Electronically Signed  By: Margarette Canada M.D.  On: 01/28/2015 12:56  Assessment & Plan:  54 year old male with a 10 mm mid to distal left ureteral calculus status post urgent stent placement.  He also has bilateral nonobstructing stones. He initially presented with acute renal failure.  We discussed various treatment options including ESWL vs. ureteroscopy, laser lithotripsy, and stent. We  discussed the risks and benefits of both including bleeding, infection, damage to surrounding structures, efficacy with need for possible further intervention, and need for temporary ureteral stent.  He is most interested in  ureteroscopy at which time the ureteral stone as well as the upper tract stones will be addressed.  1. Left ureteral stone Left ureteroscopy, laser lithotripsy, left ureteral stent exchange scheduled for next week Preop urine culture  2. Renal stones Will follow right sided stones  - Urinalysis, Complete - Bladder Scan (Post Void Residual) in office - CULTURE, URINE COMPREHENSIVE  3. Acute renal failure, unspecified acute renal failure type Plan to recheck BMP following surgery  Return for surgery scheduled.  Hollice Espy, MD  [facility] [address], [place], [place] [address] [phone]

## 2015-02-04 LAB — CULTURE, URINE COMPREHENSIVE

## 2015-02-08 ENCOUNTER — Encounter
Admission: RE | Disposition: A | Discharge status: Home / Self Care | Payer: Self-pay | Source: Ambulatory Visit | Attending: Urology

## 2015-02-08 ENCOUNTER — Ambulatory Visit: Payer: BLUE CROSS/BLUE SHIELD | Admitting: Anesthesiology

## 2015-02-08 ENCOUNTER — Ambulatory Visit
Admission: RE | Admit: 2015-02-08 | Discharge: 2015-02-08 | Disposition: A | Discharge status: Home / Self Care | Payer: BLUE CROSS/BLUE SHIELD | Source: Ambulatory Visit | Attending: Urology | Admitting: Urology

## 2015-02-08 ENCOUNTER — Encounter: Payer: Self-pay | Admitting: Urology

## 2015-02-08 ENCOUNTER — Ambulatory Visit: Admission: RE | Admit: 2015-02-08 | Payer: BLUE CROSS/BLUE SHIELD | Source: Ambulatory Visit | Admitting: Urology

## 2015-02-08 DIAGNOSIS — I739 Peripheral vascular disease, unspecified: Secondary | ICD-10-CM | POA: Diagnosis not present

## 2015-02-08 DIAGNOSIS — I714 Abdominal aortic aneurysm, without rupture: Secondary | ICD-10-CM | POA: Insufficient documentation

## 2015-02-08 DIAGNOSIS — F17210 Nicotine dependence, cigarettes, uncomplicated: Secondary | ICD-10-CM | POA: Diagnosis not present

## 2015-02-08 DIAGNOSIS — N132 Hydronephrosis with renal and ureteral calculous obstruction: Secondary | ICD-10-CM | POA: Diagnosis present

## 2015-02-08 DIAGNOSIS — R112 Nausea with vomiting, unspecified: Secondary | ICD-10-CM | POA: Diagnosis not present

## 2015-02-08 DIAGNOSIS — N200 Calculus of kidney: Secondary | ICD-10-CM | POA: Diagnosis not present

## 2015-02-08 DIAGNOSIS — N179 Acute kidney failure, unspecified: Secondary | ICD-10-CM | POA: Insufficient documentation

## 2015-02-08 DIAGNOSIS — I10 Essential (primary) hypertension: Secondary | ICD-10-CM | POA: Diagnosis not present

## 2015-02-08 DIAGNOSIS — N201 Calculus of ureter: Secondary | ICD-10-CM | POA: Diagnosis not present

## 2015-02-08 DIAGNOSIS — K760 Fatty (change of) liver, not elsewhere classified: Secondary | ICD-10-CM | POA: Diagnosis not present

## 2015-02-08 HISTORY — PX: CYSTOSCOPY/URETEROSCOPY/HOLMIUM LASER/STENT PLACEMENT: SHX6546

## 2015-02-08 SURGERY — CYSTOSCOPY/URETEROSCOPY/HOLMIUM LASER/STENT PLACEMENT
Anesthesia: General | Laterality: Left | Wound class: Clean Contaminated

## 2015-02-08 SURGERY — CYSTOSCOPY/URETEROSCOPY/HOLMIUM LASER/STENT PLACEMENT
Anesthesia: Choice | Laterality: Left

## 2015-02-08 MED ORDER — OXYCODONE-ACETAMINOPHEN 5-325 MG PO TABS
ORAL_TABLET | ORAL | Status: AC
  Administered 2015-02-08: 1 via ORAL
  Filled 2015-02-08: qty 1

## 2015-02-08 MED ORDER — ROCURONIUM BROMIDE 100 MG/10ML IV SOLN
INTRAVENOUS | Status: AC | PRN
  Administered 2015-02-08: 40 mg via INTRAVENOUS
  Administered 2015-02-08: 10 mg via INTRAVENOUS

## 2015-02-08 MED ORDER — OXYCODONE-ACETAMINOPHEN 5-325 MG PO TABS
1.0000 | ORAL_TABLET | Freq: Once | ORAL | Status: AC
  Administered 2015-02-08: 1 via ORAL

## 2015-02-08 MED ORDER — MIDAZOLAM HCL 2 MG/2ML IJ SOLN
INTRAMUSCULAR | Status: AC | PRN
  Administered 2015-02-08 – 2018-01-07 (×2): 2 mg via INTRAVENOUS

## 2015-02-08 MED ORDER — GENTAMICIN IN SALINE 1.6-0.9 MG/ML-% IV SOLN
80.0000 mg | Freq: Once | INTRAVENOUS | Status: AC
  Administered 2015-02-08: 80 mg via INTRAVENOUS
  Filled 2015-02-08: qty 50

## 2015-02-08 MED ORDER — OXYCODONE-ACETAMINOPHEN 5-325 MG PO TABS: 1.0000 | ORAL_TABLET | Freq: Four times a day (QID) | ORAL | Status: DC | PRN

## 2015-02-08 MED ORDER — DEXAMETHASONE SODIUM PHOSPHATE 4 MG/ML IJ SOLN
INTRAMUSCULAR | Status: AC | PRN
  Administered 2015-02-08: 5 mg via INTRAVENOUS

## 2015-02-08 MED ORDER — LACTATED RINGERS IV SOLN
INTRAVENOUS | Status: DC
  Administered 2015-02-08: 13:00:00 via INTRAVENOUS

## 2015-02-08 MED ORDER — PROPOFOL 10 MG/ML IV BOLUS
INTRAVENOUS | Status: AC | PRN
  Administered 2015-02-08: 180 mg via INTRAVENOUS

## 2015-02-08 MED ORDER — NEOSTIGMINE METHYLSULFATE 10 MG/10ML IV SOLN
INTRAVENOUS | Status: AC | PRN
  Administered 2015-02-08: 4 mg via INTRAVENOUS

## 2015-02-08 MED ORDER — SODIUM CHLORIDE 0.9 % IR SOLN
Status: DC | PRN
  Administered 2015-02-08: 1500 mL

## 2015-02-08 MED ORDER — FENTANYL CITRATE (PF) 100 MCG/2ML IJ SOLN
25.0000 ug | INTRAMUSCULAR | Status: DC | PRN
  Administered 2015-02-08 (×4): 25 ug via INTRAVENOUS

## 2015-02-08 MED ORDER — GENTAMICIN SULFATE 40 MG/ML IJ SOLN: 80.0000 mg | Freq: Once | INTRAVENOUS | Status: DC

## 2015-02-08 MED ORDER — IOTHALAMATE MEGLUMINE 43 % IV SOLN
INTRAVENOUS | Status: DC | PRN
  Administered 2015-02-08: 25 mL

## 2015-02-08 MED ORDER — ONDANSETRON HCL 4 MG/2ML IJ SOLN
INTRAMUSCULAR | Status: AC | PRN
  Administered 2015-02-08: 4 mg via INTRAVENOUS

## 2015-02-08 MED ORDER — LIDOCAINE HCL (CARDIAC) 20 MG/ML IV SOLN
INTRAVENOUS | Status: AC | PRN
  Administered 2015-02-08: 100 mg via INTRAVENOUS

## 2015-02-08 MED ORDER — FENTANYL CITRATE (PF) 100 MCG/2ML IJ SOLN
INTRAMUSCULAR | Status: AC
  Administered 2015-02-08: 25 ug via INTRAVENOUS
  Filled 2015-02-08: qty 2

## 2015-02-08 MED ORDER — ONDANSETRON HCL 4 MG/2ML IJ SOLN: 4.0000 mg | Freq: Once | INTRAMUSCULAR | Status: DC | PRN

## 2015-02-08 MED ORDER — FENTANYL CITRATE (PF) 100 MCG/2ML IJ SOLN
INTRAMUSCULAR | Status: AC | PRN
Start: 2015-02-08 — End: ?
  Administered 2015-02-08 (×3): 50 ug via INTRAVENOUS

## 2015-02-08 MED ORDER — SODIUM CHLORIDE 0.9 % IV SOLN
1.0000 g | Freq: Once | INTRAVENOUS | Status: AC
  Administered 2015-02-08: 1 g via INTRAVENOUS
  Filled 2015-02-08: qty 1000

## 2015-02-08 MED ORDER — GLYCOPYRROLATE 0.2 MG/ML IJ SOLN
INTRAMUSCULAR | Status: AC | PRN
  Administered 2015-02-08: .6 mg via INTRAVENOUS

## 2015-02-08 SURGICAL SUPPLY — 35 items
ADAPTER SCOPE UROLOK II (MISCELLANEOUS) ×3 IMPLANT
ADHESIVE MASTISOL STRL (MISCELLANEOUS) ×3 IMPLANT
BAG DRAIN CYSTO-URO LG1000N (MISCELLANEOUS) ×3 IMPLANT
BASKET ZERO TIP 1.9FR (BASKET) ×6 IMPLANT
BULB IRRIG PATHFIND (MISCELLANEOUS) IMPLANT
CATH URETL 5X70 OPEN END (CATHETERS) ×3 IMPLANT
CNTNR SPEC 2.5X3XGRAD LEK (MISCELLANEOUS) ×1
CONRAY 43 FOR UROLOGY 50M (MISCELLANEOUS) ×3 IMPLANT
CONT SPEC 4OZ STER OR WHT (MISCELLANEOUS) ×2
CONTAINER SPEC 2.5X3XGRAD LEK (MISCELLANEOUS) ×1 IMPLANT
DRSG TEGADERM 2-3/8X2-3/4 SM (GAUZE/BANDAGES/DRESSINGS) ×6 IMPLANT
GLOVE BIO SURGEON STRL SZ 6.5 (GLOVE) ×2 IMPLANT
GLOVE BIO SURGEON STRL SZ7 (GLOVE) ×6 IMPLANT
GLOVE BIO SURGEONS STRL SZ 6.5 (GLOVE) ×1
GOWN STRL REUS W/ TWL LRG LVL3 (GOWN DISPOSABLE) ×2 IMPLANT
GOWN STRL REUS W/TWL LRG LVL3 (GOWN DISPOSABLE) ×4
INTRODUCER DILATOR DOUBLE (INTRODUCER) IMPLANT
JELLY LUB 2OZ STRL (MISCELLANEOUS) ×2
JELLY LUBE 2OZ STRL (MISCELLANEOUS) ×1 IMPLANT
KIT RM TURNOVER CYSTO AR (KITS) ×3 IMPLANT
LASER HOLMIUM SU 200UM (MISCELLANEOUS) ×3 IMPLANT
LASER HOLMIUM SU 940UM (MISCELLANEOUS) ×3 IMPLANT
PACK CYSTO AR (MISCELLANEOUS) ×3 IMPLANT
PREP PVP WINGED SPONGE (MISCELLANEOUS) ×3 IMPLANT
PUMP SINGLE ACTION SAP (PUMP) IMPLANT
SENSORWIRE 0.038 NOT ANGLED (WIRE) ×3
SET CYSTO W/LG BORE CLAMP LF (SET/KITS/TRAYS/PACK) ×3 IMPLANT
SHEATH URETERAL 12FRX35CM (MISCELLANEOUS) IMPLANT
SOL .9 NS 3000ML IRR  AL (IV SOLUTION) ×2
SOL .9 NS 3000ML IRR UROMATIC (IV SOLUTION) ×1 IMPLANT
STENT URET 6FRX24 CONTOUR (STENTS) IMPLANT
STENT URET 6FRX26 CONTOUR (STENTS) ×3 IMPLANT
TUBING PRES M/F 48IN 4237111 (TUBING) IMPLANT
WATER STERILE IRR 1000ML POUR (IV SOLUTION) ×3 IMPLANT
WIRE SENSOR 0.038 NOT ANGLED (WIRE) ×1 IMPLANT

## 2015-02-08 NOTE — H&P (View-Only) (Signed)
02/02/2015 1:51 PM   Jodi Mourning 1961-06-12 [id number]  Referring provider: Sofie Hartigan, MD [place], [place] [address]  Chief Complaint  Patient presents with  . Pre-op Exam    L ureteral stone, L ureteroscopy w/ Holmium laser stent exchange    HPI: 54 year old male who presents today after undergoing left ureteral stent placement on 01/28/2015 by Dr. Tresa Moore.  He presented earlier that day to the ER with left flank pain, nausea and vomiting at which time he was noted to have a 10 x 5 mm left mid ureteral stone with moderate hydronephrosis, as well as other left-sided nonobstructing stones including a 1 cm stone in the left lower pole. He also has some nonobstructing right-sided stones.  On presentation to the ER, he was also noted to have acute renal failure with a creatinine of 2.45, leukocytosis with a WBC of 15.1, and a negative urinalysis.  He tolerated the stent placement without difficulty and was discharged home later that day.  He returns to the office to discuss definitive management of the stone. Of note, he does have a previous history of this, underwent ureteroscopy approximately 25 years ago.  He's not had treatment or follow-up for his stone since that time.  He has been doing well with the stent has otherwise no complaints today. No fevers or chills, no nausea or vomiting.   PMH: Past Medical History  Diagnosis Date  . Hypertension   . AAA (abdominal aortic aneurysm)   . Renal stones     Surgical History: Past Surgical History  Procedure Laterality Date  . Lithotripsy    . Facial cosmetic surgery    . Appendectomy    . Cystoscopy w/ ureteral stent placement Left 01/28/2015    Procedure: CYSTOSCOPY WITH RETROGRADE PYELOGRAM/URETERAL STENT PLACEMENT;  Surgeon: Alexis Frock, MD;  Location: [facility] ORS;  Service: Urology;  Laterality: Left;    Home Medications:    Medication List    Notice    This visit is during an admission. Changes  to the med list made in this visit will be reflected in the After Visit Summary of the admission.      Allergies: No Known Allergies  Family History: History reviewed. No pertinent family history.  Social History:  reports that he has been smoking Cigarettes.  He has a 300 pack-year smoking history. He does not have any smokeless tobacco history on file. He reports that he does not drink alcohol or use illicit drugs.   Review of Systems UROLOGY Frequent Urination?: Yes Hard to postpone urination?: No Burning/pain with urination?: No Get up at night to urinate?: Yes Leakage of urine?: No Urine stream starts and stops?: Yes Trouble starting stream?: No Do you have to strain to urinate?: No Blood in urine?: No Urinary tract infection?: No Sexually transmitted disease?: No Injury to kidneys or bladder?: No Painful intercourse?: No Weak stream?: Yes Erection problems?: Yes Penile pain?: No Gastrointestinal Nausea?: No Vomiting?: No Indigestion/heartburn?: No Diarrhea?: No Constipation?: No Constitutional Fever: No Night sweats?: No Weight loss?: No Fatigue?: No Skin Skin rash/lesions?: No Itching?: No Eyes Blurred vision?: No Double vision?: No Ears/Nose/Throat Sore throat?: No Sinus problems?: No Hematologic/Lymphatic Swollen glands?: No Easy bruising?: No Cardiovascular Leg swelling?: No Chest pain?: No Respiratory Cough?: No Shortness of breath?: No Endocrine Excessive thirst?: No Musculoskeletal Back pain?: Yes Joint pain?: Yes Neurological Headaches?: No Dizziness?: No Psychologic Depression?: No Anxiety?: No   Physical Exam: BP 105/65 mmHg  Pulse 107  Ht 5\' 9"  (1.753 m)  Wt 216 lb 9.6 oz (98.249 kg)  BMI 31.97 kg/m2  Constitutional:  Alert and oriented, No acute distress. HEENT: [place] AT, moist mucus membranes.  Trachea midline, no masses. Cardiovascular: No clubbing, cyanosis, or edema. RRR. Respiratory: Normal respiratory effort, no  increased work of breathing.  CTAB. GI: Abdomen is soft, nontender, nondistended, no abdominal masses GU: No CVA tenderness.  Skin: No rashes, bruises or suspicious lesions. Lymph: No cervical or inguinal adenopathy. Neurologic: Grossly intact, no focal deficits, moving all 4 extremities. Psychiatric: Normal mood and affect.  Laboratory Data: Lab Results  Component Value Date   WBC 15.1* 01/28/2015   HGB 14.7 01/28/2015   HCT 43.9 01/28/2015   MCV 92.8 01/28/2015   PLT 312 01/28/2015    Lab Results  Component Value Date   CREATININE 2.43* 01/28/2015   Urinalysis    Component Value Date/Time   COLORURINE YELLOW* 01/28/2015 1353   APPEARANCEUR CLEAR* 01/28/2015 1353   LABSPEC 1.016 01/28/2015 1353   PHURINE 5.0 01/28/2015 1353   GLUCOSEU Negative 02/02/2015 1100   HGBUR NEGATIVE 01/28/2015 1353   BILIRUBINUR Negative 02/02/2015 1100   BILIRUBINUR NEGATIVE 01/28/2015 1353   KETONESUR NEGATIVE 01/28/2015 1353   PROTEINUR NEGATIVE 01/28/2015 1353   NITRITE Negative 02/02/2015 1100   NITRITE NEGATIVE 01/28/2015 1353   LEUKOCYTESUR Trace* 02/02/2015 1100   LEUKOCYTESUR NEGATIVE 01/28/2015 1353    Pertinent Imaging: CLINICAL DATA: Left flank and abdominal pain for 3 weeks. History of urinary calculi.  EXAM: CT ABDOMEN AND PELVIS WITHOUT CONTRAST  TECHNIQUE: Multidetector CT imaging of the abdomen and pelvis was performed following the standard protocol without IV contrast.  COMPARISON: None.  FINDINGS: Please note that parenchymal abnormalities may be missed without intravenous contrast.  Lower chest: Mild bibasilar scarring noted.  Hepatobiliary: Diffuse hepatic steatosis noted. No focal hepatic abnormalities are identified. Gallbladder is unremarkable. There is no evidence of biliary dilatation.  Pancreas: Unremarkable  Spleen: Unremarkable  Adrenals/Urinary Tract: A 5 x 10 mm mid-distal left ureteral calculus is noted causing moderate left  hydroureteronephrosis.  There are multiple nonobstructing bilateral renal calculi-that nonobstructing bilateral renal calculi are identified including a a 6 x 10 mm mid right ureteral calculus, 2 separate 3 mm left upper pole renal calculi and a 6 x 10 mm left lower pole renal calculus.  The adrenal glands and bladder are unremarkable.  Stomach/Bowel: Colonic diverticulosis noted without diverticulitis. There is no evidence of bowel obstruction or focal bowel wall thickening.  Vascular/Lymphatic: A 3.8 cm infrarenal suprailiac abdominal aortic aneurysm is identified measuring 5 cm in length. Aortic atherosclerotic calcifications are noted. No enlarged lymph nodes are identified.  Reproductive: Prostate unremarkable.  Other: No free fluid, pneumoperitoneum or abscess.  Musculoskeletal: No acute or suspicious abnormalities.  IMPRESSION: 5 x 10 mm mid -distal left ureteral calculus causing moderate left hydroureteronephrosis.  3.8 cm infrarenal suprailiac abdominal aortic aneurysm. Recommend followup by ultrasound in 2 years. This recommendation follows ACR consensus guidelines: White Paper of the ACR Incidental Findings Committee II on Vascular Findings. J Am Coll Radiol 2013; 10:789-794.  Nonobstructing bilateral renal calculi.  Hepatic steatosis.   Electronically Signed  By: Margarette Canada M.D.  On: 01/28/2015 12:56  Assessment & Plan:  54 year old male with a 10 mm mid to distal left ureteral calculus status post urgent stent placement.  He also has bilateral nonobstructing stones. He initially presented with acute renal failure.  We discussed various treatment options including ESWL vs. ureteroscopy, laser lithotripsy, and stent. We  discussed the risks and benefits of both including bleeding, infection, damage to surrounding structures, efficacy with need for possible further intervention, and need for temporary ureteral stent.  He is most interested in  ureteroscopy at which time the ureteral stone as well as the upper tract stones will be addressed.  1. Left ureteral stone Left ureteroscopy, laser lithotripsy, left ureteral stent exchange scheduled for next week Preop urine culture  2. Renal stones Will follow right sided stones  - Urinalysis, Complete - Bladder Scan (Post Void Residual) in office - CULTURE, URINE COMPREHENSIVE  3. Acute renal failure, unspecified acute renal failure type Plan to recheck BMP following surgery  Return for surgery scheduled.  Hollice Espy, MD  [facility] [address] [place], [place] [address] [phone]

## 2015-02-08 NOTE — Anesthesia Preprocedure Evaluation (Signed)
Anesthesia Evaluation  Patient identified by MRN, date of birth, ID band Patient awake    Reviewed: Allergy & Precautions, NPO status , Patient's Chart, lab work & pertinent test results  Airway Mallampati: II       Dental  (+) Edentulous Lower, Loose, Dental Advisory Given   Pulmonary COPDCurrent Smoker,  + rhonchi         Cardiovascular hypertension, Pt. on medications + Peripheral Vascular Disease Normal cardiovascular exam    Neuro/Psych negative neurological ROS     GI/Hepatic negative GI ROS, Neg liver ROS,   Endo/Other  negative endocrine ROS  Renal/GU Renal disease  negative genitourinary   Musculoskeletal negative musculoskeletal ROS (+)   Abdominal Normal abdominal exam  (+)   Peds negative pediatric ROS (+)  Hematology negative hematology ROS (+)   Anesthesia Other Findings   Reproductive/Obstetrics negative OB ROS                             Anesthesia Physical Anesthesia Plan  ASA: III  Anesthesia Plan: General   Post-op Pain Management:    Induction: Intravenous  Airway Management Planned: Oral ETT  Additional Equipment:   Intra-op Plan:   Post-operative Plan: Extubation in OR  Informed Consent: I have reviewed the patients History and Physical, chart, labs and discussed the procedure including the risks, benefits and alternatives for the proposed anesthesia with the patient or authorized representative who has indicated his/her understanding and acceptance.     Plan Discussed with: CRNA  Anesthesia Plan Comments:         Anesthesia Quick Evaluation

## 2015-02-08 NOTE — Transfer of Care (Signed)
Immediate Anesthesia Transfer of Care Note  Patient: [name]  Procedure(s) Performed: Procedure(s): CYSTOSCOPY/URETEROSCOPY/HOLMIUM LASER/STENT PLACEMENT (Left)  Patient Location: PACU  Anesthesia Type:General  Level of Consciousness: Alert, Awake, Oriented  Airway & Oxygen Therapy: Patient Spontanous Breathing  Post-op Assessment: Report given to RN  Post vital signs: Reviewed and stable  Last Vitals:  Filed Vitals:   02/08/15 1431  BP: 115/64  Pulse: 94  Temp: 36.8 C  Resp: 18    Complications: No apparent anesthesia complications

## 2015-02-08 NOTE — Anesthesia Procedure Notes (Signed)
Procedure Name: Intubation Date/Time: 02/08/2015 1:20 PM Performed by: Silvana Newness Pre-anesthesia Checklist: Patient identified, Emergency Drugs available, Suction available, Patient being monitored and Timeout performed Patient Re-evaluated:Patient Re-evaluated prior to inductionOxygen Delivery Method: Circle system utilized Preoxygenation: Pre-oxygenation with 100% oxygen Intubation Type: IV induction Ventilation: Mask ventilation without difficulty Laryngoscope Size: Mac and 4 Grade View: Grade II Tube type: Oral Tube size: 7.5 mm Number of attempts: 1 Airway Equipment and Method: Rigid stylet Placement Confirmation: ETT inserted through vocal cords under direct vision,  positive ETCO2 and breath sounds checked- equal and bilateral Secured at: 20 cm Tube secured with: Tape Dental Injury: Teeth and Oropharynx as per pre-operative assessment  Comments: Lower teeth loose in pre-op.  Patient aware, dental advisory given.  Patient confirmed.  Teeth the same as before intubation.

## 2015-02-08 NOTE — Interval H&P Note (Signed)
History and Physical Interval Note:  02/08/2015 12:32 PM  [name]  has presented today for surgery, with the diagnosis of S  The various methods of treatment have been discussed with the patient and family. After consideration of risks, benefits and other options for treatment, the patient has consented to  Procedure(s): CYSTOSCOPY/URETEROSCOPY/HOLMIUM LASER/STENT PLACEMENT (Left) as a surgical intervention .  The patient's history has been reviewed, patient examined, no change in status, stable for surgery.  I have reviewed the patient's chart and labs.  Questions were answered to the patient's satisfaction.     Hollice Espy

## 2015-02-08 NOTE — Op Note (Signed)
Date of procedure: 02/08/2015  Preoperative diagnosis:  1. Left ureteral stone 2. Left kidney stones   Postoperative diagnosis:  1. Same as above   Procedure: 1. Left ureteroscopy, laser lithotripsy 2. Left retrograde pyelogram 3. Basket extraction of Stone fragment 4. Left ureteral stent exchange  Surgeon: Hollice Espy, MD  Anesthesia: General  Complications: None  Intraoperative findings: Large 10 mm distal ureteral stone identified just distal to the iliacs. This is obliterated completely and removed. Left lower pole stone also identified and treated.  EBL: Minimal  Specimens: stone fragment  Drains: 6 x 26 French double-J ureteral stent on left, string left in place  Indication: [name] is a 54 y.o. patient with a 10 mm left mid/distal ureteral stone as well as some nonobstructing left nonobstructing renal calculi..  After reviewing the management options for treatment, he elected to proceed with the above surgical procedure(s). We have discussed the potential benefits and risks of the procedure, side effects of the proposed treatment, the likelihood of the patient achieving the goals of the procedure, and any potential problems that might occur during the procedure or recuperation. Informed consent has been obtained.  Description of procedure:  The patient was taken to the operating room and general anesthesia was induced.  The patient was placed in the dorsal lithotomy position, prepped and draped in the usual sterile fashion, and preoperative antibiotics were administered. A preoperative time-out was performed.   At this point in time, a rigid 70 French cystoscope was advanced per urethra into the bladder. Attention was turned to the left ureteral orifice from which a ureteral stent was seen emanating. The distal coil of the stent was grasped and brought out to the level of the urethral meatus. The stent was then cannulated using a sensor wire up to level of the  kidney as confirmed fluoroscopically. The stent was removed. The wire was snapped in place as a safety wire. Next, a rigid long ureteroscope was advanced through the distal ureter without difficulty into the stone was encountered. A 200  laser fiber using settings of 0.8 J and 8 Hz was then used to fragment the stone into proximally 5-6 pieces. A 1.9 French nitinol basket was then used to extract each and every piece until the ureter was completely clear stone fragment. A second sensor wire was then advanced up to level of the kidney. A flexible ureteroscope was then advanced up to level of the kidney under fluoroscopic guidance. Formal pyeloscopy was then performed. A large extreme lower pole stone was encountered but given the difficult location, I was unable to laser the stone in this location. The basket was used to grasp the stone and repositioned into an upper pole calyx. The stone was then fragmented into 2 larger pieces. The larger of the 2 pieces was then extracted ureteroscopically using the basket out of the kidney. Dual lumen access sheath introducer was then used just within the UO to reintroduce a second working wire. The scope was then readvanced up to level of the kidney. The remainder of the smaller portion of the upper pole stone was then fragmented using dusting settings such that the stone was obliterated in each fragment was not much larger than the laser fiber. The scope was then backed to level of the UPJ which time a retrograde Polygram was performed crating a roadmap of the kidney. Each and every calyx was then directly visualized to ensure that there was no significant residual fragments. Once the kidney was deemed  clear for fragments, the scope was backed down the ureter under direct visualization.  The sensor wire was then backloaded over a rigid cystoscope.  A 6 x 26 French double-J ureteral stent was then advanced up to level of the kidney under fluoroscopic guidance. The wire was  partially withdrawn until coil was noted within the renal pelvis. The wire was then fully withdrawn until coil was noted within the bladder. The stent was left on a string. The patient was then cleaned and dried. The stent string was affixed to the patient's glans using Mastisol and Tegaderm. He was then repositioned the supine position, reversed from anesthesia, and taken to the PACU in stable condition. There were no complications in this case.  Plan: Patient will remove his own stent in 2 days. He will return for follow-up with a renal ultrasound prior and 4 weeks.  We will address how to proceed with his right-sided stone at that visit.  Hollice Espy, M.D.

## 2015-02-08 NOTE — Discharge Instructions (Signed)
AMBULATORY SURGERY  DISCHARGE INSTRUCTIONS   1) The drugs that you were given will stay in your system until tomorrow so for the next 24 hours you should not:  A) Drive an automobile B) Make any legal decisions C) Drink any alcoholic beverage   2) You may resume regular meals tomorrow.  Today it is better to start with liquids and gradually work up to solid foods.  You may eat anything you prefer, but it is better to start with liquids, then soup and crackers, and gradually work up to solid foods.   3) Please notify your doctor immediately if you have any unusual bleeding, trouble breathing, redness and pain at the surgery site, drainage, fever, or pain not relieved by medication.    4) Additional Instructions:        Please contact your physician with any problems or Same Day Surgery at [phone], Monday through Friday 6 am to 4 pm, or [place] at [facility] number at [phone].   You have a ureteral stent in place.  This is a tube that extends from your kidney to your bladder.  This may cause urinary bleeding, burning with urination, and urinary frequency.  Please call our office or present to the ED if you develop fevers >101 or pain which is not able to be controlled with oral pain medications.  You may be given either Flomax and/ or ditropan to help with bladder spasms and stent pain in addition to pain medications.    You have a ureteral stent in place. This will need to be removed. It is on a string taped to the head of your penis.  On Friday morning, untaped the stent string and gently pole until the entirety stent is removed. If you have any complications or questions, please call our office.  [facility] [address], [place], [place] [address] [phone]

## 2015-02-10 LAB — STONE ANALYSIS
CA OXALATE, MONOHYDR.: 15 %
Ca Oxalate,Dihydrate: 70 %
Ca phos cry stone ql IR: 15 %
STONE WEIGHT KSTONE: 22 mg

## 2015-02-17 NOTE — Anesthesia Postprocedure Evaluation (Signed)
  Anesthesia Post-op Note  Patient: [name]  Procedure(s) Performed: Procedure(s): CYSTOSCOPY/URETEROSCOPY/HOLMIUM LASER/STENT PLACEMENT (Left)  Anesthesia type:General  Patient location: PACU  Post pain: Pain level controlled  Post assessment: Post-op Vital signs reviewed, Patient's Cardiovascular Status Stable, Respiratory Function Stable, Patent Airway and No signs of Nausea or vomiting  Post vital signs: Reviewed and stable  Last Vitals:  Filed Vitals:   02/08/15 1547  BP: 114/67  Pulse: 80  Temp:   Resp:     Level of consciousness: awake, alert  and patient cooperative  Complications: No apparent anesthesia complications

## 2015-03-07 ENCOUNTER — Ambulatory Visit
Admission: RE | Admit: 2015-03-07 | Discharge: 2015-03-07 | Disposition: A | Discharge status: Home / Self Care | Payer: BLUE CROSS/BLUE SHIELD | Source: Ambulatory Visit | Attending: Urology | Admitting: Urology

## 2015-03-07 DIAGNOSIS — N200 Calculus of kidney: Secondary | ICD-10-CM | POA: Insufficient documentation

## 2015-03-07 DIAGNOSIS — N201 Calculus of ureter: Secondary | ICD-10-CM

## 2015-03-14 ENCOUNTER — Ambulatory Visit (INDEPENDENT_AMBULATORY_CARE_PROVIDER_SITE_OTHER): Payer: BLUE CROSS/BLUE SHIELD | Admitting: Urology

## 2015-03-14 ENCOUNTER — Encounter: Payer: Self-pay | Admitting: Urology

## 2015-03-14 VITALS — BP 119/79 | HR 114 | Ht 69.0 in | Wt 219.5 lb

## 2015-03-14 DIAGNOSIS — N200 Calculus of kidney: Secondary | ICD-10-CM | POA: Diagnosis not present

## 2015-03-14 DIAGNOSIS — E1159 Type 2 diabetes mellitus with other circulatory complications: Secondary | ICD-10-CM | POA: Insufficient documentation

## 2015-03-14 DIAGNOSIS — I10 Essential (primary) hypertension: Secondary | ICD-10-CM | POA: Insufficient documentation

## 2015-03-14 DIAGNOSIS — Z87442 Personal history of urinary calculi: Secondary | ICD-10-CM

## 2015-03-14 HISTORY — DX: Personal history of urinary calculi: Z87.442

## 2015-03-14 HISTORY — DX: Calculus of kidney: N20.0

## 2015-03-14 LAB — URINALYSIS, COMPLETE
BILIRUBIN UA: NEGATIVE
Glucose, UA: NEGATIVE
Ketones, UA: NEGATIVE
LEUKOCYTES UA: NEGATIVE
Nitrite, UA: NEGATIVE
PROTEIN UA: NEGATIVE
RBC, UA: NEGATIVE
Specific Gravity, UA: 1.025 (ref 1.005–1.030)
UUROB: 1 mg/dL (ref 0.2–1.0)
pH, UA: 5.5 (ref 5.0–7.5)

## 2015-03-14 LAB — MICROSCOPIC EXAMINATION
BACTERIA UA: NONE SEEN
Epithelial Cells (non renal): NONE SEEN /hpf (ref 0–10)
RBC, UA: NONE SEEN /hpf (ref 0–?)

## 2015-03-14 NOTE — Progress Notes (Signed)
03/14/2015 1:45 PM   [name] 01/08/1961 [id number]  Referring provider: Sofie Hartigan, MD [place],  [address]  Chief Complaint  Patient presents with  . Results    48month    HPI: Patient had a large calculus removed from the left ureter with ureteroscopy after stenting. There is no residual calculi. Ultrasound is reviewed reviewed. There are calculi bilaterally in the midpole one in each kidney ranging in size between 9 and 12 mm HPI   PMH: Past Medical History  Diagnosis Date  . Hypertension   . AAA (abdominal aortic aneurysm)   . Renal stones   . BP (high blood pressure) 03/14/2015  . 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 Acid reflux 12/01/2013  . 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 Calculus of kidney 03/14/2015  . HLD (hyperlipidemia) 12/01/2013    Surgical History: Past Surgical History  Procedure Laterality Date  . Lithotripsy    . Facial cosmetic surgery    . Appendectomy    . Cystoscopy w/ ureteral stent placement Left 01/28/2015    Procedure: CYSTOSCOPY WITH RETROGRADE PYELOGRAM/URETERAL STENT PLACEMENT;  Surgeon: Alexis Frock, MD;  Location: [facility] ORS;  Service: Urology;  Laterality: Left;  . Cystoscopy/ureteroscopy/holmium laser/stent placement Left 02/08/2015    Procedure: CYSTOSCOPY/URETEROSCOPY/HOLMIUM LASER/STENT PLACEMENT;  Surgeon: Hollice Espy, MD;  Location: [facility] ORS;  Service: Urology;  Laterality: Left;    Home Medications:    Medication List       This list is accurate as of: 03/14/15  1:45 PM.  Always use your most recent med list.               atorvastatin 10 MG tablet  Commonly known as:  LIPITOR  Take 10 mg by mouth every morning.     COMBIVENT RESPIMAT 20-100 MCG/ACT Aers respimat  Generic drug:  Ipratropium-Albuterol  Inhale 1 puff into the lungs 4 (four) times daily as needed.     fenofibrate 145 MG tablet  Commonly known as:  TRICOR  Take 145 mg by mouth daily.     lisinopril 10 MG tablet  Commonly  known as:  PRINIVIL,ZESTRIL  Take 10 mg by mouth daily.        Allergies: No Known Allergies  Family History: No family history on file.  Social History:  reports that he has been smoking Cigarettes.  He has a 300 pack-year smoking history. He does not have any smokeless tobacco history on file. He reports that he does not drink alcohol or use illicit drugs.  ROS: UROLOGY Frequent Urination?: Yes Hard to postpone urination?: No Burning/pain with urination?: No Get up at night to urinate?: No Leakage of urine?: No Urine stream starts and stops?: No Trouble starting stream?: No Do you have to strain to urinate?: No Blood in urine?: No Urinary tract infection?: No Sexually transmitted disease?: No Injury to kidneys or bladder?: No Painful intercourse?: No Weak stream?: No Erection problems?: No Penile pain?: No  Gastrointestinal Nausea?: No Vomiting?: No Indigestion/heartburn?: No Diarrhea?: No Constipation?: No  Constitutional Fever: No Night sweats?: No Weight loss?: No Fatigue?: No  Skin Skin rash/lesions?: No Itching?: No  Eyes Blurred vision?: No Double vision?: No  Ears/Nose/Throat Sore throat?: No Sinus problems?: No  Hematologic/Lymphatic Swollen glands?: No Easy bruising?: No  Cardiovascular Leg swelling?: No Chest pain?: No  Respiratory Cough?: No Shortness of breath?: No  Endocrine Excessive thirst?: No  Musculoskeletal Back pain?: Yes Joint pain?: No  Neurological Headaches?: No Dizziness?: No  Psychologic Depression?: No Anxiety?: No  Physical Exam: BP 119/79 mmHg  Pulse 114  Ht 5\' 9"  (1.753 m)  Wt 219 lb 8 oz (99.565 kg)  BMI 32.40 kg/m2  Constitutional:  Alert and oriented, No acute distress. HEENT: [place] AT, moist mucus membranes.  Trachea midline, no masses. Cardiovascular: No clubbing, cyanosis, or edema. Respiratory: Normal respiratory effort, no increased work of breathing. GI: Abdomen is soft, nontender,  nondistended, no abdominal masses GU: No CVA tenderness. Abdomen soft negative Murphy's Lloyd's McBurney's Skin: No rashes, bruises or suspicious lesions. Lymph: No cervical or inguinal adenopathy. Neurologic: Grossly intact, no focal deficits, moving all 4 extremities. Psychiatric: Normal mood and affect.  Laboratory Data: Lab Results  Component Value Date   WBC 15.1* 01/28/2015   HGB 14.7 01/28/2015   HCT 43.9 01/28/2015   MCV 92.8 01/28/2015   PLT 312 01/28/2015    Lab Results  Component Value Date   CREATININE 2.43* 01/28/2015    No results found for: PSA  No results found for: TESTOSTERONE  No results found for: HGBA1C  Urinalysis    Component Value Date/Time   COLORURINE YELLOW* 01/28/2015 1353   COLORURINE Yellow 09/25/2012 1155   APPEARANCEUR CLEAR* 01/28/2015 1353   APPEARANCEUR Hazy 09/25/2012 1155   LABSPEC 1.016 01/28/2015 1353   LABSPEC 1.020 09/25/2012 1155   PHURINE 5.0 01/28/2015 1353   PHURINE 5.0 09/25/2012 1155   GLUCOSEU Negative 03/14/2015 1140   GLUCOSEU Negative 09/25/2012 1155   HGBUR NEGATIVE 01/28/2015 1353   HGBUR Negative 09/25/2012 1155   BILIRUBINUR Negative 03/14/2015 1140   BILIRUBINUR NEGATIVE 01/28/2015 1353   BILIRUBINUR Negative 09/25/2012 [place] 01/28/2015 1353   KETONESUR Negative 09/25/2012 1155   PROTEINUR NEGATIVE 01/28/2015 1353   PROTEINUR Negative 09/25/2012 1155   NITRITE Negative 03/14/2015 1140   NITRITE NEGATIVE 01/28/2015 1353   NITRITE Negative 09/25/2012 1155   LEUKOCYTESUR Negative 03/14/2015 1140   LEUKOCYTESUR NEGATIVE 01/28/2015 1353   LEUKOCYTESUR Negative 09/25/2012 1155    Pertinent Imaging: Renal ultrasound revealing calculi in both kidneys kidneys the midpole without hydronephrosis  Assessment and Plan: Bilateral renal calculi 12 mm and 9 mm mid pole of each kidney patient does not want to do any shockwave therapy at this time but will call us when he wants to have shockwave  therapy done for one or the other these calculi. I would do the large calculus first. Otherwise no sequelae from his previous ureteroscopy and 10 mm calculus removal. No hydronephrosis no other calculi.      Problem List Items Addressed This Visit    None    Visit Diagnoses    Kidney stones    -  Primary    Relevant Orders    Urinalysis, Complete (Completed)       No Follow-up on file.  Collier Flowers, [facility] [address], [place], [place] [address] [phone]

## 2016-04-04 ENCOUNTER — Other Ambulatory Visit: Payer: Self-pay | Admitting: Family Medicine

## 2016-04-04 DIAGNOSIS — M5441 Lumbago with sciatica, right side: Principal | ICD-10-CM

## 2016-04-04 DIAGNOSIS — G8929 Other chronic pain: Secondary | ICD-10-CM

## 2016-04-10 ENCOUNTER — Encounter (INDEPENDENT_AMBULATORY_CARE_PROVIDER_SITE_OTHER): Payer: Self-pay

## 2016-04-10 DIAGNOSIS — I739 Peripheral vascular disease, unspecified: Secondary | ICD-10-CM

## 2016-04-11 ENCOUNTER — Ambulatory Visit: Admission: RE | Admit: 2016-04-11 | Payer: BLUE CROSS/BLUE SHIELD | Source: Ambulatory Visit

## 2016-04-11 ENCOUNTER — Ambulatory Visit
Admission: RE | Admit: 2016-04-11 | Discharge: 2016-04-11 | Disposition: A | Discharge status: Home / Self Care | Payer: BLUE CROSS/BLUE SHIELD | Source: Ambulatory Visit | Attending: Family Medicine | Admitting: Family Medicine

## 2016-04-11 DIAGNOSIS — M5441 Lumbago with sciatica, right side: Secondary | ICD-10-CM | POA: Diagnosis not present

## 2016-04-11 DIAGNOSIS — M47896 Other spondylosis, lumbar region: Secondary | ICD-10-CM | POA: Diagnosis not present

## 2016-04-11 DIAGNOSIS — I714 Abdominal aortic aneurysm, without rupture: Secondary | ICD-10-CM | POA: Insufficient documentation

## 2016-04-11 DIAGNOSIS — G8929 Other chronic pain: Secondary | ICD-10-CM | POA: Diagnosis present

## 2016-05-10 ENCOUNTER — Other Ambulatory Visit (INDEPENDENT_AMBULATORY_CARE_PROVIDER_SITE_OTHER): Payer: Self-pay | Admitting: Vascular Surgery

## 2016-05-10 DIAGNOSIS — I739 Peripheral vascular disease, unspecified: Secondary | ICD-10-CM

## 2016-05-10 DIAGNOSIS — I70213 Atherosclerosis of native arteries of extremities with intermittent claudication, bilateral legs: Secondary | ICD-10-CM

## 2016-05-13 ENCOUNTER — Ambulatory Visit (INDEPENDENT_AMBULATORY_CARE_PROVIDER_SITE_OTHER): Payer: BLUE CROSS/BLUE SHIELD

## 2016-05-13 ENCOUNTER — Encounter (INDEPENDENT_AMBULATORY_CARE_PROVIDER_SITE_OTHER): Payer: Self-pay | Admitting: Vascular Surgery

## 2016-05-13 ENCOUNTER — Ambulatory Visit (INDEPENDENT_AMBULATORY_CARE_PROVIDER_SITE_OTHER): Payer: BLUE CROSS/BLUE SHIELD | Admitting: Vascular Surgery

## 2016-05-13 VITALS — BP 140/92 | HR 93 | Resp 17 | Ht 68.0 in | Wt 232.0 lb

## 2016-05-13 DIAGNOSIS — I714 Abdominal aortic aneurysm, without rupture, unspecified: Secondary | ICD-10-CM

## 2016-05-13 DIAGNOSIS — E782 Mixed hyperlipidemia: Secondary | ICD-10-CM | POA: Diagnosis not present

## 2016-05-13 DIAGNOSIS — I739 Peripheral vascular disease, unspecified: Secondary | ICD-10-CM

## 2016-05-13 DIAGNOSIS — I10 Essential (primary) hypertension: Secondary | ICD-10-CM

## 2016-05-13 DIAGNOSIS — I70213 Atherosclerosis of native arteries of extremities with intermittent claudication, bilateral legs: Secondary | ICD-10-CM

## 2016-06-02 NOTE — Progress Notes (Signed)
[facility] Admission History & Physical  MRN : [id number]  [name] is a 55 y.o. (22-Sep-1960) male who presents with chief complaint of  Chief Complaint  Patient presents with  . Follow-up  .  History of Present Illness: The patient returns to the office for surveillance of a known abdominal aortic aneurysm. Patient denies abdominal pain or back pain, no other abdominal complaints. No changes suggesting embolic episodes.   There have been no interval changes in the patient's overall health care since his last visit.  Patient denies amaurosis fugax or TIA symptoms. There is no history of claudication or rest pain symptoms of the lower extremities. The patient denies angina or shortness of breath.   Duplex US of the aorta and iliac arteries shows moderate bilateral common iliac artery stenosis.  ABI's are normal bilaterally  Current Meds  Medication Sig  . atorvastatin (LIPITOR) 10 MG tablet Take 10 mg by mouth every morning.  Marland Kitchen esomeprazole (NEXIUM) 10 MG packet Take 10 mg by mouth daily before breakfast.  . fenofibrate (TRICOR) 145 MG tablet Take 145 mg by mouth daily.  . Ipratropium-Albuterol (COMBIVENT RESPIMAT) 20-100 MCG/ACT AERS respimat Inhale 1 puff into the lungs 4 (four) times daily as needed.  Marland Kitchen lisinopril (PRINIVIL,ZESTRIL) 10 MG tablet Take 10 mg by mouth daily.    Past Medical History:  Diagnosis Date  . AAA (abdominal aortic aneurysm) ([place])   . Acid reflux 12/01/2013  . 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 BP (high blood pressure) 03/14/2015  . Calculus of kidney 03/14/2015  . COPD, mild ([place]) 03/25/2014  . HLD (hyperlipidemia) 12/01/2013  . Hypertension   . Renal stones     Past Surgical History:  Procedure Laterality Date  . APPENDECTOMY    . CYSTOSCOPY W/ URETERAL STENT PLACEMENT Left 01/28/2015   Procedure: CYSTOSCOPY WITH RETROGRADE PYELOGRAM/URETERAL STENT PLACEMENT;  Surgeon: Alexis Frock, MD;  Location: [facility] ORS;   Service: Urology;  Laterality: Left;  . CYSTOSCOPY/URETEROSCOPY/HOLMIUM LASER/STENT PLACEMENT Left 02/08/2015   Procedure: CYSTOSCOPY/URETEROSCOPY/HOLMIUM LASER/STENT PLACEMENT;  Surgeon: Hollice Espy, MD;  Location: [facility] ORS;  Service: Urology;  Laterality: Left;  . FACIAL COSMETIC SURGERY    . LITHOTRIPSY      Social History Social History  Substance Use Topics  . Smoking status: Current Every Day Smoker    Packs/day: 10.00    Years: 30.00    Types: Cigarettes  . Smokeless tobacco: Not on file  . Alcohol use No    Family History No family history on file. No family history of bleeding/clotting disorders, porphyria or autoimmune disease   No Known Allergies   REVIEW OF SYSTEMS (Negative unless checked)  Constitutional: [] Weight loss  [] Fever  [] Chills Cardiac: [] Chest pain   [] Chest pressure   [] Palpitations   [] Shortness of breath when laying flat   [] Shortness of breath with exertion. Vascular:  [] Pain in legs with walking   [] Pain in legs at rest  [] History of DVT   [] Phlebitis   [] Swelling in legs   [] Varicose veins   [] Non-healing ulcers Pulmonary:   [] Uses home oxygen   [] Productive cough   [] Hemoptysis   [] Wheeze  [] COPD   [] Asthma Neurologic:  [] Dizziness   [] Seizures   [] History of stroke   [] History of TIA  [] Aphasia   [] Vissual changes   [] Weakness or numbness in arm   [] Weakness or numbness in leg Musculoskeletal:   [] Joint swelling   [] Joint pain   [] Low back pain Hematologic:  [] Easy bruising  []   Easy bleeding   [] Hypercoagulable state   [] Anemic Gastrointestinal:  [] Diarrhea   [] Vomiting  [] Gastroesophageal reflux/heartburn   [] Difficulty swallowing. Genitourinary:  [] Chronic kidney disease   [] Difficult urination  [] Frequent urination   [] Blood in urine Skin:  [] Rashes   [] Ulcers  Psychological:  [] History of anxiety   []  History of major depression.  Physical Examination  Vitals:   05/13/16 0918  BP: (!) 140/92  Pulse: 93  Resp: 17  Weight: 232 lb  (105.2 kg)  Height: 5\' 8"  (1.727 m)   Body mass index is 35.28 kg/m. Gen: WD/WN, NAD Head: [place]/AT, No temporalis wasting.  Ear/Nose/Throat: Hearing grossly intact, nares w/o erythema or drainage, poor dentition Eyes: PER, EOMI, sclera nonicteric.  Neck: Supple, no masses.  No bruit or JVD.  Pulmonary:  Good air movement, clear to auscultation bilaterally, no use of accessory muscles.  Cardiac: RRR, normal S1, S2, no Murmurs. Vascular:  Vessel Right Left  Radial Palpable Palpable  Ulnar Palpable Palpable  Brachial Palpable Palpable  Carotid Palpable Palpable  Femoral Palpable Palpable  Popliteal Palpable Palpable  PT Palpable Palpable  DP Palpable Palpable   Gastrointestinal: soft, non-distended. No guarding/no peritoneal signs.  Musculoskeletal: M/S 5/5 throughout.  No deformity or atrophy.  Neurologic: CN 2-12 intact. Pain and light touch intact in extremities.  Symmetrical.  Speech is fluent. Motor exam as listed above. Psychiatric: Judgment intact, Mood & affect appropriate for pt's clinical situation. Dermatologic: No rashes or ulcers noted.  No changes consistent with cellulitis. Lymph : No Cervical lymphadenopathy, no lichenification or skin changes of chronic lymphedema.  CBC Lab Results  Component Value Date   WBC 15.1 (H) 01/28/2015   HGB 14.7 01/28/2015   HCT 43.9 01/28/2015   MCV 92.8 01/28/2015   PLT 312 01/28/2015    BMET    Component Value Date/Time   NA 134 (L) 01/28/2015 1152   NA 135 (L) 09/25/2012 1155   K 3.9 01/28/2015 1152   K 3.7 09/25/2012 1155   CL 102 01/28/2015 1152   CL 102 09/25/2012 1155   CO2 23 01/28/2015 1152   CO2 26 09/25/2012 1155   GLUCOSE 113 (H) 01/28/2015 1152   GLUCOSE 95 09/25/2012 1155   BUN 24 (H) 01/28/2015 1152   BUN 16 09/25/2012 1155   CREATININE 2.43 (H) 01/28/2015 1152   CREATININE 0.90 09/25/2012 1155   CALCIUM 8.8 (L) 01/28/2015 1152   CALCIUM 8.8 09/25/2012 1155   GFRNONAA 29 (L) 01/28/2015 1152   GFRNONAA  >60 09/25/2012 1155   GFRAA 33 (L) 01/28/2015 1152   GFRAA >60 09/25/2012 1155   CrCl cannot be calculated (Patient's most recent lab result is older than the maximum 21 days allowed.).  COAG No results found for: INR, PROTIME  Radiology No results found.  Assessment/Plan 1. AAA (abdominal aortic aneurysm) without rupture (HCC) No surgery or intervention at this time. The patient has an asymptomatic abdominal aortic aneurysm that is less than 4 cm in maximal diameter.  I have discussed the natural history of abdominal aortic aneurysm and the small risk of rupture for aneurysm less than 5 cm in size.  However, as these small aneurysms tend to enlarge over time, continued surveillance with ultrasound or CT scan is mandatory.  I have also discussed optimizing medical management with hypertension and lipid control and the importance of abstinence from tobacco.  The patient is also encouraged to exercise a minimum of 30 minutes 4 times a week.  Should the patient develop new onset abdominal  or back pain or signs of peripheral embolization they are instructed to seek medical attention immediately and to alert the physician providing care that they have an aneurysm.  The patient voices their understanding. The patient will return in 12 months with an aortic duplex.  - VAS US AORTA/IVC/ILIACS; Future  2. PVD (peripheral vascular disease) ([place]) Recommend:  The patient has experienced increased symptoms and is now describing lifestyle limiting claudication and mild rest pain.   Given the severity of the patient's lower extremity symptoms the patient should undergo angiography and intervention.  Risk and benefits were reviewed the patient.  Indications for the procedure were reviewed.  All questions were answered, the patient agrees to proceed.   The patient should continue walking and begin a more formal exercise program.  The patient should continue antiplatelet therapy and aggressive  treatment of the lipid abnormalities  The patient will follow up with me after the angiogram.  - VAS [place] ABI WITH/WO TBI; Future  3. Mixed hyperlipidemia Continue statin as ordered and reviewed, no changes at this time  4. Essential hypertension Continue antihypertensive medications as already ordered and reviewed, no changes at this time.  Hortencia Pilar, MD  06/02/2016 6:31 PM

## 2016-07-10 IMAGING — CT CT RENAL STONE PROTOCOL
3 of 4 series · 9 of 46 positions shown, 16 images · non-contrast
Comparison: None.

CLINICAL DATA: Left flank and abdominal pain for 3 weeks. History
of urinary calculi.

EXAM:
CT ABDOMEN AND PELVIS WITHOUT CONTRAST
TECHNIQUE: Multidetector CT imaging of the abdomen and pelvis was performed
following the standard protocol without IV contrast.

[Series 4: lung · axial · 0.79mm/px · z∈[-394,-304]mm · 5 of 28 slices shown, 10 images]
[im 5/28  soft-tissue]
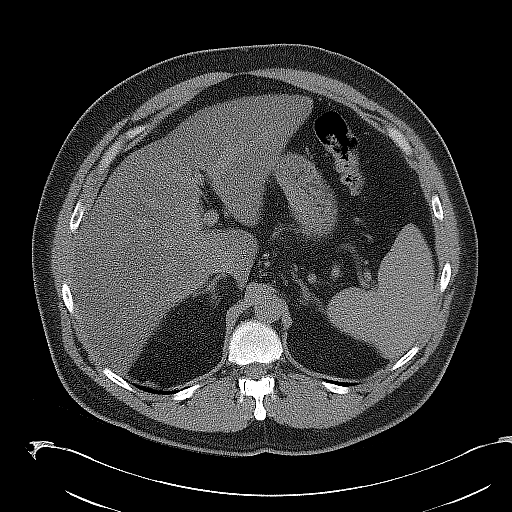
[im 5/28  bone]
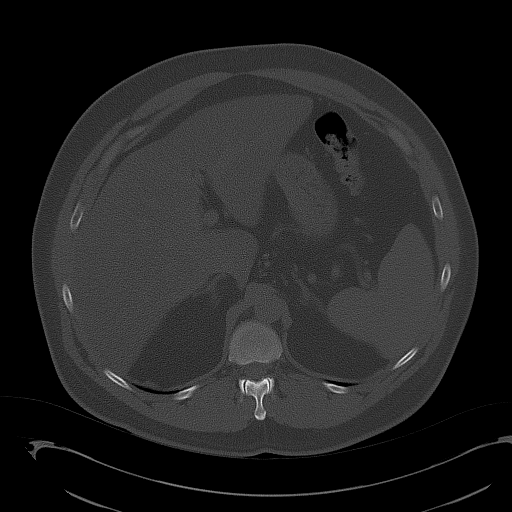
[im 10/28  soft-tissue]
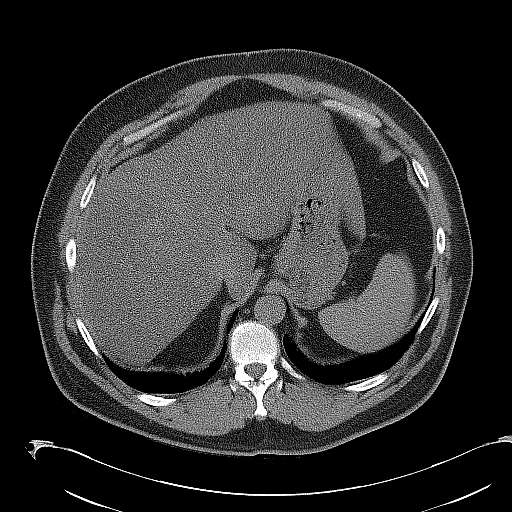
[im 10/28  lung]
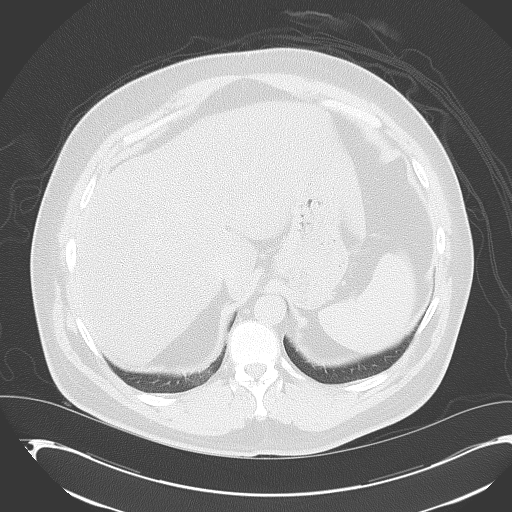
[im 14/28  soft-tissue]
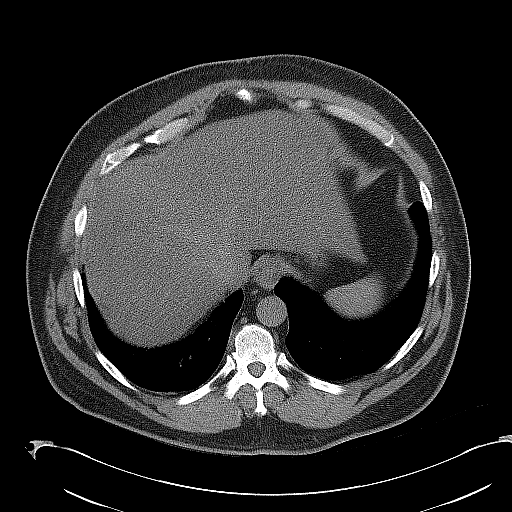
[im 14/28  lung]
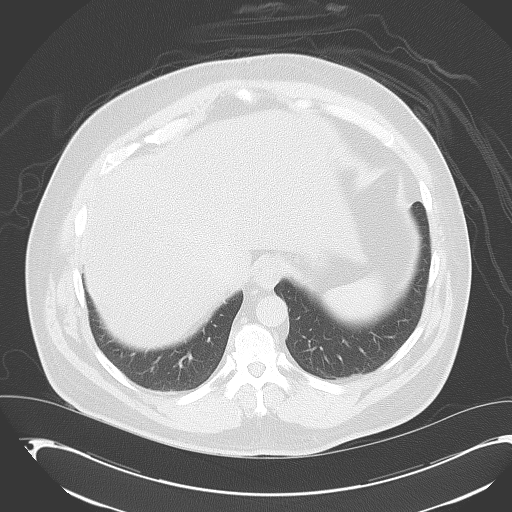
[im 19/28  soft-tissue]
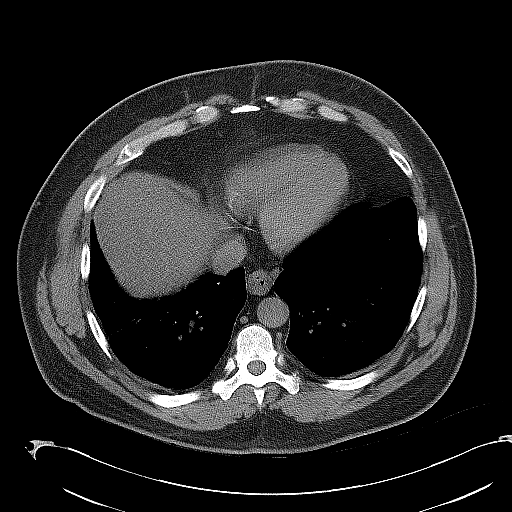
[im 19/28  lung]
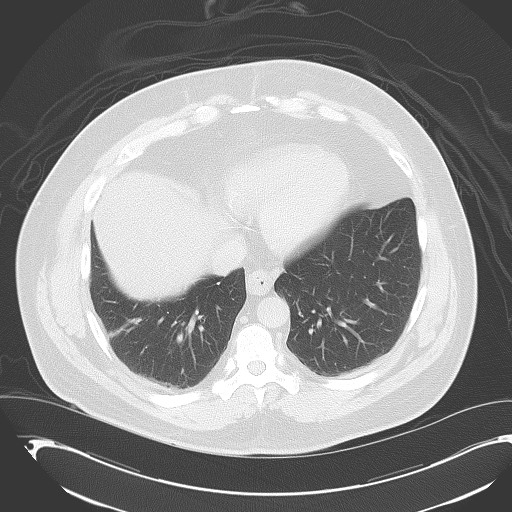
[im 23/28  soft-tissue]
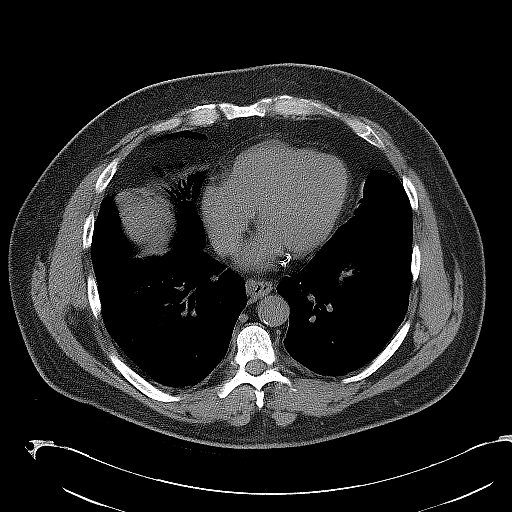
[im 23/28  lung]
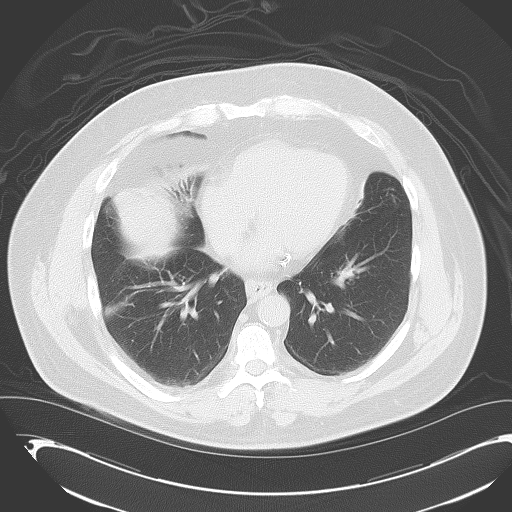

[Series 5: coronal · coronal · 0.79mm/px · 3 of 177 slices shown, 4 images]
[im 59/177  soft-tissue]
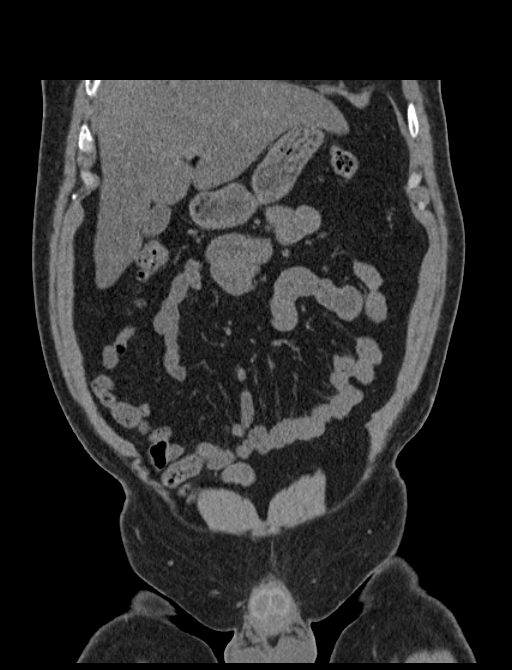
[im 79/177  soft-tissue]
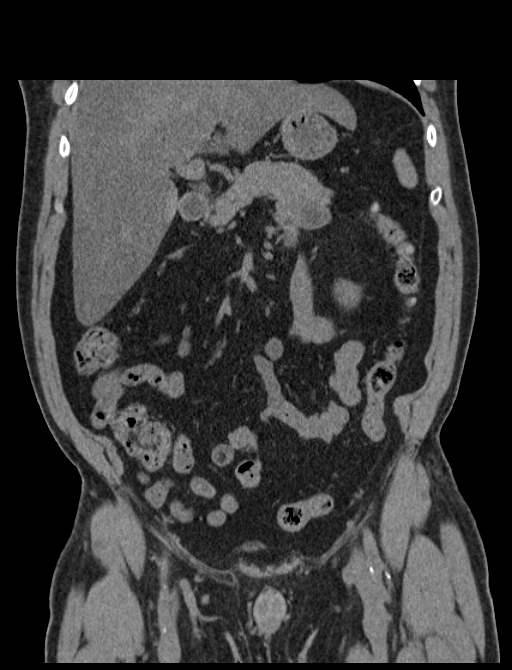
[im 79/177  bone]
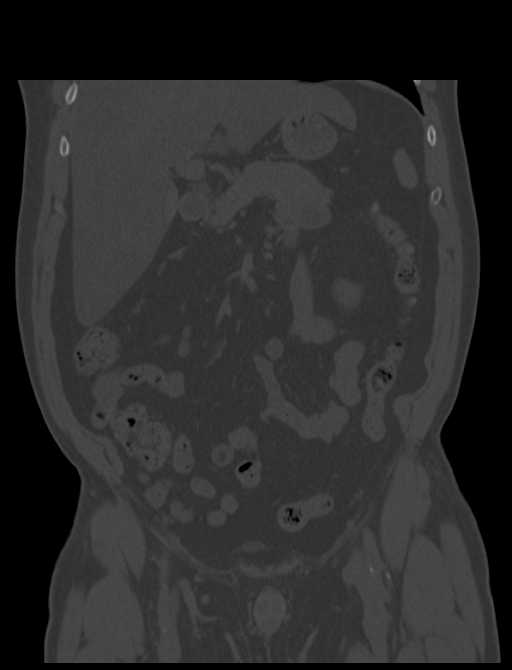
[im 98/177  soft-tissue]
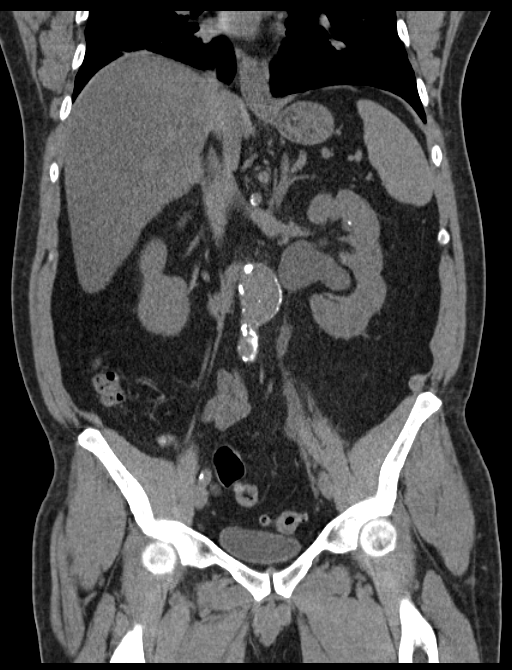

[Series 6: sagittal · sagittal · 0.67mm/px · 1 of 207 slices shown, 2 images]
[im 69/207  soft-tissue]
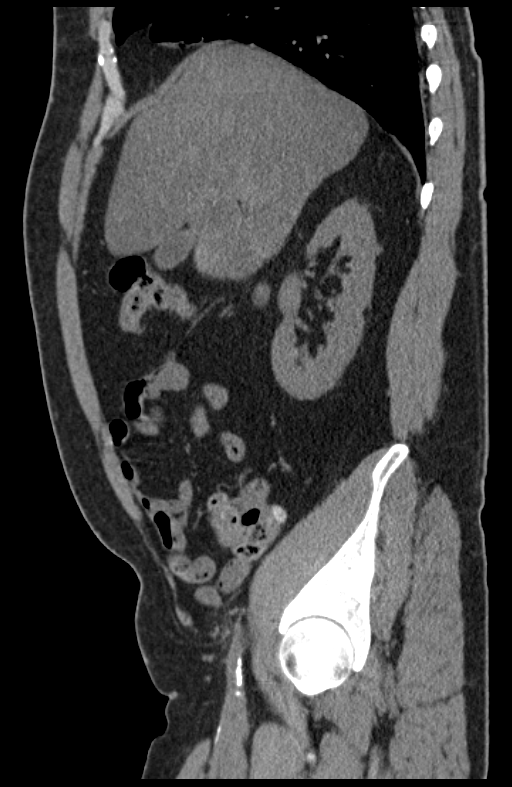
[im 69/207  bone]
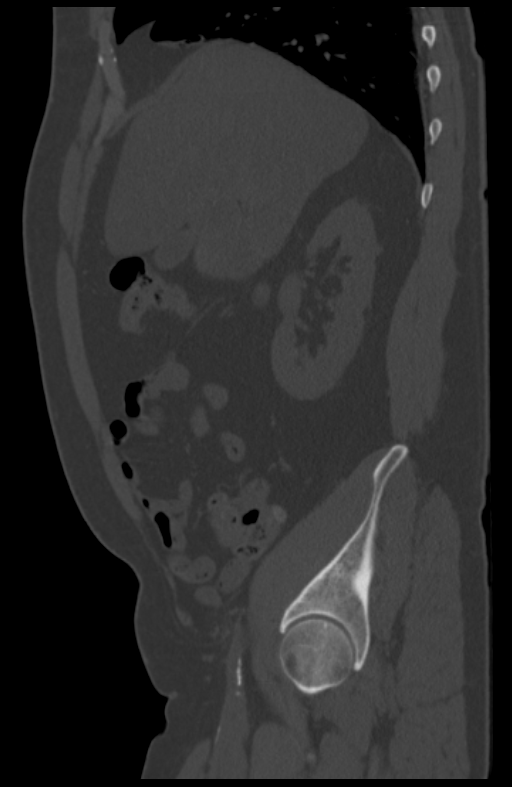

[9 of 46 positions shown; findings below may reference images not displayed]

FINDINGS: Please note that parenchymal abnormalities may be missed without
intravenous contrast.

Lower chest:  Mild bibasilar scarring noted.

Hepatobiliary: Diffuse hepatic steatosis noted. No focal hepatic
abnormalities are identified. Gallbladder is unremarkable. There is
no evidence of biliary dilatation.

Pancreas: Unremarkable

Spleen: Unremarkable

Adrenals/Urinary Tract: A 5 x 10 mm mid-distal left ureteral
calculus is noted causing moderate left hydroureteronephrosis.

There are multiple nonobstructing bilateral renal calculi-that
nonobstructing bilateral renal calculi are identified including a a
6 x 10 mm mid right ureteral calculus, 2 separate 3 mm left upper
pole renal calculi and a 6 x 10 mm left lower pole renal calculus.

The adrenal glands and bladder are unremarkable.

Stomach/Bowel: Colonic diverticulosis noted without diverticulitis.
There is no evidence of bowel obstruction or focal bowel wall
thickening.

Vascular/Lymphatic: A 3.8 cm infrarenal suprailiac abdominal aortic
aneurysm is identified measuring 5 cm in length. Aortic
atherosclerotic calcifications are noted. No enlarged lymph nodes
are identified.

Reproductive: Prostate unremarkable.

Other: No free fluid, pneumoperitoneum or abscess.

Musculoskeletal: No acute or suspicious abnormalities.
IMPRESSION: 5 x 10 mm mid -distal left ureteral calculus causing moderate left
hydroureteronephrosis.

3.8 cm infrarenal suprailiac abdominal aortic aneurysm. Recommend
followup by ultrasound in 2 years. This recommendation follows ACR
consensus guidelines: White Paper of the ACR Incidental Findings
Committee II on Vascular Findings. [HOSPITAL] 9114;
[DATE].

Nonobstructing bilateral renal calculi.

Hepatic steatosis.

## 2016-11-03 ENCOUNTER — Encounter: Payer: Self-pay | Admitting: Emergency Medicine

## 2016-11-03 ENCOUNTER — Emergency Department
Admission: EM | Admit: 2016-11-03 | Discharge: 2016-11-03 | Disposition: A | Discharge status: Home / Self Care | Payer: BLUE CROSS/BLUE SHIELD | Attending: Emergency Medicine | Admitting: Emergency Medicine

## 2016-11-03 DIAGNOSIS — J449 Chronic obstructive pulmonary disease, unspecified: Secondary | ICD-10-CM | POA: Diagnosis not present

## 2016-11-03 DIAGNOSIS — M542 Cervicalgia: Secondary | ICD-10-CM | POA: Diagnosis present

## 2016-11-03 DIAGNOSIS — I10 Essential (primary) hypertension: Secondary | ICD-10-CM | POA: Insufficient documentation

## 2016-11-03 DIAGNOSIS — Z5321 Procedure and treatment not carried out due to patient leaving prior to being seen by health care provider: Secondary | ICD-10-CM | POA: Insufficient documentation

## 2016-11-03 DIAGNOSIS — Z79899 Other long term (current) drug therapy: Secondary | ICD-10-CM | POA: Insufficient documentation

## 2016-11-03 DIAGNOSIS — F17210 Nicotine dependence, cigarettes, uncomplicated: Secondary | ICD-10-CM | POA: Diagnosis not present

## 2016-11-03 NOTE — ED Triage Notes (Signed)
Patient states that he felt a pop in the left side of his neck Friday morning. Patient states that he continues to have pain.

## 2017-05-05 ENCOUNTER — Encounter (INDEPENDENT_AMBULATORY_CARE_PROVIDER_SITE_OTHER): Payer: BLUE CROSS/BLUE SHIELD

## 2017-05-05 ENCOUNTER — Ambulatory Visit (INDEPENDENT_AMBULATORY_CARE_PROVIDER_SITE_OTHER): Payer: BLUE CROSS/BLUE SHIELD | Admitting: Vascular Surgery

## 2017-05-05 ENCOUNTER — Other Ambulatory Visit (INDEPENDENT_AMBULATORY_CARE_PROVIDER_SITE_OTHER): Payer: BLUE CROSS/BLUE SHIELD

## 2017-12-23 DIAGNOSIS — R1084 Generalized abdominal pain: Secondary | ICD-10-CM | POA: Insufficient documentation

## 2017-12-23 DIAGNOSIS — N200 Calculus of kidney: Secondary | ICD-10-CM | POA: Insufficient documentation

## 2017-12-23 DIAGNOSIS — J449 Chronic obstructive pulmonary disease, unspecified: Secondary | ICD-10-CM | POA: Diagnosis not present

## 2017-12-23 DIAGNOSIS — F17210 Nicotine dependence, cigarettes, uncomplicated: Secondary | ICD-10-CM | POA: Diagnosis not present

## 2017-12-23 DIAGNOSIS — I10 Essential (primary) hypertension: Secondary | ICD-10-CM | POA: Diagnosis not present

## 2017-12-23 DIAGNOSIS — Z79899 Other long term (current) drug therapy: Secondary | ICD-10-CM | POA: Insufficient documentation

## 2017-12-23 MED ORDER — OXYCODONE-ACETAMINOPHEN 5-325 MG PO TABS
ORAL_TABLET | ORAL | Status: AC
  Administered 2017-12-24: 2 via ORAL
  Filled 2017-12-23: qty 2

## 2017-12-23 NOTE — ED Notes (Signed)
Patient unable to provide urine specimen at this time.

## 2017-12-23 NOTE — ED Triage Notes (Signed)
Patient c/o left flank pain radiating to abdomen X 2 with increasing severity today. Patient report hx of kidney stones. Patient reports he has had lithotripsy twice.

## 2017-12-24 ENCOUNTER — Emergency Department: Payer: BLUE CROSS/BLUE SHIELD

## 2017-12-24 ENCOUNTER — Emergency Department
Admission: EM | Admit: 2017-12-24 | Discharge: 2017-12-24 | Disposition: A | Discharge status: Home / Self Care | Payer: BLUE CROSS/BLUE SHIELD | Attending: Emergency Medicine | Admitting: Emergency Medicine

## 2017-12-24 DIAGNOSIS — N200 Calculus of kidney: Secondary | ICD-10-CM

## 2017-12-24 LAB — URINALYSIS, COMPLETE (UACMP) WITH MICROSCOPIC
BILIRUBIN URINE: NEGATIVE
Bacteria, UA: NONE SEEN
GLUCOSE, UA: 50 mg/dL — AB
KETONES UR: NEGATIVE mg/dL
LEUKOCYTES UA: NEGATIVE
NITRITE: NEGATIVE
PROTEIN: 30 mg/dL — AB
RBC / HPF: >50 RBC/hpf — ABNORMAL HIGH (ref 0–5)
Specific Gravity, Urine: 1.021 (ref 1.005–1.030)
Squamous Epithelial / LPF: NONE SEEN (ref 0–5)
pH: 5 (ref 5.0–8.0)

## 2017-12-24 LAB — CBC WITH DIFFERENTIAL/PLATELET
BASOS ABS: 0.1 10*3/uL (ref 0–0.1)
BASOS PCT: 1 %
EOS PCT: 2 %
Eosinophils Absolute: 0.3 10*3/uL (ref 0–0.7)
HCT: 45.2 % (ref 40.0–52.0)
Hemoglobin: 15.2 g/dL (ref 13.0–18.0)
Lymphocytes Relative: 32 %
Lymphs Abs: 4 10*3/uL — ABNORMAL HIGH (ref 1.0–3.6)
MCH: 31.8 pg (ref 26.0–34.0)
MCHC: 33.6 g/dL (ref 32.0–36.0)
MCV: 94.6 fL (ref 80.0–100.0)
MONOS PCT: 8 %
Monocytes Absolute: 0.9 10*3/uL (ref 0.2–1.0)
Neutro Abs: 7.1 10*3/uL — ABNORMAL HIGH (ref 1.4–6.5)
Neutrophils Relative %: 57 %
PLATELETS: 295 10*3/uL (ref 150–440)
RBC: 4.78 MIL/uL (ref 4.40–5.90)
RDW: 13.5 % (ref 11.5–14.5)
WBC: 12.3 10*3/uL — ABNORMAL HIGH (ref 3.8–10.6)

## 2017-12-24 LAB — BASIC METABOLIC PANEL
ANION GAP: 9 (ref 5–15)
BUN: 24 mg/dL — ABNORMAL HIGH (ref 6–20)
CALCIUM: 9.2 mg/dL (ref 8.9–10.3)
CO2: 25 mmol/L (ref 22–32)
CREATININE: 1.29 mg/dL — AB (ref 0.61–1.24)
Chloride: 103 mmol/L (ref 101–111)
GFR calc Af Amer: >60 mL/min (ref >60)
Glucose, Bld: 138 mg/dL — ABNORMAL HIGH (ref 65–99)
Potassium: 4.6 mmol/L (ref 3.5–5.1)
Sodium: 137 mmol/L (ref 135–145)

## 2017-12-24 MED ORDER — OXYCODONE-ACETAMINOPHEN 5-325 MG PO TABS
1.0000 | ORAL_TABLET | ORAL | 0 refills | Status: DC | PRN
Start: 2017-12-24 — End: 2018-01-07

## 2017-12-24 MED ORDER — MORPHINE SULFATE (PF) 4 MG/ML IV SOLN
4.0000 mg | Freq: Once | INTRAVENOUS | Status: DC
  Filled 2017-12-24: qty 1

## 2017-12-24 MED ORDER — ONDANSETRON 4 MG PO TBDP: 4.0000 mg | ORAL_TABLET | Freq: Three times a day (TID) | ORAL | 0 refills | Status: DC | PRN

## 2017-12-24 MED ORDER — OXYCODONE-ACETAMINOPHEN 5-325 MG PO TABS
ORAL_TABLET | ORAL | Status: AC
  Administered 2017-12-24: 2 via ORAL
  Filled 2017-12-24: qty 2

## 2017-12-24 MED ORDER — TAMSULOSIN HCL 0.4 MG PO CAPS: 0.4000 mg | ORAL_CAPSULE | Freq: Every day | ORAL | 0 refills | Status: DC

## 2017-12-24 MED ORDER — OXYCODONE-ACETAMINOPHEN 5-325 MG PO TABS
2.0000 | ORAL_TABLET | Freq: Once | ORAL | Status: AC
  Administered 2017-12-24: 2 via ORAL

## 2017-12-24 MED ORDER — ONDANSETRON HCL 4 MG/2ML IJ SOLN
4.0000 mg | Freq: Once | INTRAMUSCULAR | Status: AC
  Administered 2017-12-24: 4 mg via INTRAVENOUS
  Filled 2017-12-24: qty 2

## 2017-12-24 MED ORDER — SODIUM CHLORIDE 0.9 % IV BOLUS
1000.0000 mL | Freq: Once | INTRAVENOUS | Status: AC
  Administered 2017-12-24: 1000 mL via INTRAVENOUS

## 2017-12-24 MED ORDER — MORPHINE SULFATE (PF) 4 MG/ML IV SOLN
4.0000 mg | Freq: Once | INTRAVENOUS | Status: AC
  Administered 2017-12-24: 4 mg via INTRAVENOUS
  Filled 2017-12-24: qty 1

## 2017-12-24 NOTE — ED Notes (Signed)
Reviewed pt's CT scan; pt is currrently outside vomiting; charge nurse called for available exam room

## 2017-12-24 NOTE — Progress Notes (Signed)
12/25/2017 2:25 PM   [name] 1960-12-22 [id number]  Referring provider: Sofie Hartigan, MD [address] [place] [address], [place] [address]  Chief Complaint  Patient presents with  . Nephrolithiasis    New Patient    HPI: Patient is a 57 year old Caucasian male who was referred by [facility] ED for a ureteral stone.  He presented to the ED on 12/24/2017 for left flank pain and nausea.  CT Renal Stone study noted moderate left-sided hydronephrosis, with an obstructing 5 mm stone at the distal left ureter, 2 cm above the left vesicoureteral junction.  Scattered nonobstructing bilateral renal stones, measuring up to 8 mm in size. Nonobstructing 8 mm stone also noted at the left renal pelvis.  Creatinine 1.29.  UA > 50 RBC's.  WBC's 12.3.    He was given morphine, Percocet, Zofran and Flomax in the ER.  He was discharged with Percocet and Flomax.     He has had ESWL in the past and URS in the past.   Most recent URS was in 2016 with Dr. Erlene Quan.    Today, he is experiencing urgency, nocturia, incontinence, intermittency, hesitancy, straining to urinate, gross hematuria and a weak urinary stream.  He is also having left flank pain and left groin pain.  The pain was intense enough that he had to leave work.  Patient denies any gross hematuria, dysuria or suprapubic/flank pain.  Patient denies any fevers, chills, nausea or vomiting.   PMH: Past Medical History:  Diagnosis Date  . AAA (abdominal aortic aneurysm) ([place])   . Acid reflux 12/01/2013  . 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 BP (high blood pressure) 03/14/2015  . Calculus of kidney 03/14/2015  . 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 HLD (hyperlipidemia) 12/01/2013  . Hypertension   . Renal stones     Surgical History: Past Surgical History:  Procedure Laterality Date  . APPENDECTOMY    . CYSTOSCOPY W/ URETERAL STENT PLACEMENT Left 01/28/2015   Procedure: CYSTOSCOPY WITH RETROGRADE PYELOGRAM/URETERAL STENT PLACEMENT;  Surgeon:  Alexis Frock, MD;  Location: [facility] ORS;  Service: Urology;  Laterality: Left;  . CYSTOSCOPY/URETEROSCOPY/HOLMIUM LASER/STENT PLACEMENT Left 02/08/2015   Procedure: CYSTOSCOPY/URETEROSCOPY/HOLMIUM LASER/STENT PLACEMENT;  Surgeon: Hollice Espy, MD;  Location: [facility] ORS;  Service: Urology;  Laterality: Left;  . FACIAL COSMETIC SURGERY    . LITHOTRIPSY      Home Medications:  Allergies as of 12/25/2017   No Known Allergies     Medication List        Accurate as of 12/25/17  2:25 PM. Always use your most recent med list.          acetaminophen 650 MG CR tablet Commonly known as:  TYLENOL Take by mouth.   atorvastatin 10 MG tablet Commonly known as:  LIPITOR Take 10 mg by mouth every morning.   COMBIVENT RESPIMAT 20-100 MCG/ACT Aers respimat Generic drug:  Ipratropium-Albuterol Inhale 1 puff into the lungs 4 (four) times daily as needed.   esomeprazole 10 MG packet Commonly known as:  NEXIUM Take 10 mg by mouth daily before breakfast.   fenofibrate 145 MG tablet Commonly known as:  TRICOR Take 145 mg by mouth daily.   lisinopril 10 MG tablet Commonly known as:  PRINIVIL,ZESTRIL Take 10 mg by mouth daily.   meloxicam 15 MG tablet Commonly known as:  MOBIC Take 15 mg by mouth daily.   metFORMIN 500 MG tablet Commonly known as:  GLUCOPHAGE Take 500 mg by mouth 2 (two) times daily  with a meal.   MULTI-VITAMINS Tabs Take by mouth.   ondansetron 4 MG disintegrating tablet Commonly known as:  ZOFRAN ODT Take 1 tablet (4 mg total) by mouth every 8 (eight) hours as needed for nausea or vomiting.   oxyCODONE-acetaminophen 5-325 MG tablet Commonly known as:  PERCOCET Take 1 tablet by mouth every 4 (four) hours as needed for severe pain.   tamsulosin 0.4 MG Caps capsule Commonly known as:  FLOMAX Take 1 capsule (0.4 mg total) by mouth daily after breakfast.       Allergies: No Known Allergies  Family History: Family History  Problem Relation Age of Onset  .  Prostate cancer Neg Hx   . Bladder Cancer Neg Hx   . Kidney cancer Neg Hx     Social History:  reports that he has been smoking cigarettes.  He has a 300.00 pack-year smoking history. He has never used smokeless tobacco. He reports that he does not drink alcohol or use drugs.  ROS: UROLOGY Frequent Urination?: No Hard to postpone urination?: Yes Burning/pain with urination?: No Get up at night to urinate?: Yes Leakage of urine?: Yes Urine stream starts and stops?: Yes Trouble starting stream?: Yes Do you have to strain to urinate?: Yes Blood in urine?: Yes Urinary tract infection?: No Sexually transmitted disease?: No Injury to kidneys or bladder?: No Painful intercourse?: No Weak stream?: Yes Erection problems?: Yes Penile pain?: No  Gastrointestinal Nausea?: Yes Vomiting?: Yes Indigestion/heartburn?: Yes Diarrhea?: No Constipation?: Yes  Constitutional Fever: No Night sweats?: No Weight loss?: No Fatigue?: No  Skin Skin rash/lesions?: No Itching?: No  Eyes Blurred vision?: No Double vision?: No  Ears/Nose/Throat Sore throat?: No Sinus problems?: Yes  Hematologic/Lymphatic Swollen glands?: No Easy bruising?: No  Cardiovascular Leg swelling?: No Chest pain?: No  Respiratory Cough?: No Shortness of breath?: Yes  Endocrine Excessive thirst?: No  Musculoskeletal Back pain?: Yes Joint pain?: No  Neurological Headaches?: No Dizziness?: No  Psychologic Depression?: No Anxiety?: No  Physical Exam: BP 121/78 (BP Location: Right Arm, Patient Position: Sitting, Cuff Size: Large)   Pulse (!) 106   Ht 5\' 8"  (1.727 m)   Wt 232 lb 14.4 oz (105.6 kg)   BMI 35.41 kg/m   Constitutional:  Well nourished. Alert and oriented, No acute distress. HEENT: [place] AT, moist mucus membranes.  Trachea midline, no masses. Cardiovascular: No clubbing, cyanosis, or edema. Respiratory: Normal respiratory effort, no increased work of breathing. GI: Abdomen is soft,  non tender, non distended, no abdominal masses. Liver and spleen not palpable.  No hernias appreciated.  Stool sample for occult testing is not indicated.   GU: No CVA tenderness.  No bladder fullness or masses.   Skin: No rashes, bruises or suspicious lesions. Lymph: No cervical or inguinal adenopathy. Neurologic: Grossly intact, no focal deficits, moving all 4 extremities. Psychiatric: Normal mood and affect.  Laboratory Data: Lab Results  Component Value Date   WBC 12.3 (H) 12/23/2017   HGB 15.2 12/23/2017   HCT 45.2 12/23/2017   MCV 94.6 12/23/2017   PLT 295 12/23/2017    Lab Results  Component Value Date   CREATININE 1.29 (H) 12/23/2017    No results found for: PSA  No results found for: TESTOSTERONE  No results found for: HGBA1C  No results found for: TSH  No results found for: CHOL, HDL, CHOLHDL, VLDL, LDLCALC  Lab Results  Component Value Date   AST 25 01/28/2015   Lab Results  Component Value Date   ALT  29 01/28/2015   No components found for: ALKALINEPHOPHATASE No components found for: BILIRUBINTOTAL  No results found for: ESTRADIOL  Urinalysis Negative.  See Epic.   I have reviewed the labs.   Pertinent Imaging: CLINICAL DATA:  Acute onset of left flank pain radiating to the abdomen.  EXAM: CT ABDOMEN AND PELVIS WITHOUT CONTRAST  TECHNIQUE: Multidetector CT imaging of the abdomen and pelvis was performed following the standard protocol without IV contrast.  COMPARISON:  CT of the abdomen and pelvis performed 01/28/2015, MRI of the lumbar spine performed 04/11/2016  FINDINGS: Lower chest: The visualized lung bases are grossly clear. Scattered coronary artery calcifications are seen.  Hepatobiliary: There is diffuse fatty infiltration within the liver. The gallbladder is decompressed and unremarkable in appearance. The common bile duct is normal in caliber.  Pancreas: The pancreas is within normal limits.  Spleen: The spleen  is unremarkable in appearance.  Adrenals/Urinary Tract: The adrenal glands are unremarkable.  Moderate left-sided hydronephrosis is noted. An obstructing 5 mm stone is noted at the distal left ureter, 2 cm above the left vesicoureteral junction.  A non-obstructing 8 mm stone is noted at the left renal pelvis. Scattered nonobstructing bilateral renal stones are seen, measuring up to 8 mm in size. Nonspecific perinephric stranding is noted bilaterally.  Stomach/Bowel: The stomach is unremarkable in appearance. The small bowel is within normal limits. The appendix is not visualized; there is no evidence for appendicitis.  Mild scattered diverticulosis is noted along the transverse, descending and sigmoid colon, without evidence of diverticulitis.  Vascular/Lymphatic: Diffuse calcification is seen along the abdominal aorta and its branches. There is aneurysmal dilatation of the infrarenal abdominal aorta to 3.7 cm in AP dimension and 3.8 cm in transverse dimension, which resolves proximal to the aortic bifurcation.  The inferior vena cava is grossly unremarkable. No retroperitoneal lymphadenopathy is seen. No pelvic sidewall lymphadenopathy is identified.  Reproductive: The bladder is mildly distended and grossly unremarkable. The prostate is normal in size.  Other: No additional soft tissue abnormalities are seen.  Musculoskeletal: No acute osseous abnormalities are identified. The visualized musculature is unremarkable in appearance.  IMPRESSION: 1. Moderate left-sided hydronephrosis, with an obstructing 5 mm stone at the distal left ureter, 2 cm above the left vesicoureteral junction. 2. Scattered nonobstructing bilateral renal stones, measuring up to 8 mm in size. Nonobstructing 8 mm stone also noted at the left renal pelvis. 3. Aneurysmal dilatation of the infrarenal abdominal aorta to 3.7 cm in AP dimension and 3.8 cm in transverse dimension, which  resolves proximal to the aortic bifurcation. Recommend followup by ultrasound in 2 years. This recommendation follows ACR consensus guidelines: White Paper of the ACR Incidental Findings Committee II on Vascular Findings. J Am Coll Radiol 2013; 10:789-794. 4. Scattered coronary artery calcifications seen. 5. Diffuse fatty infiltration within the liver. 6. Mild scattered diverticulosis along the transverse, descending and sigmoid colon, without evidence of diverticulitis.  Aortic Atherosclerosis (ICD10-I70.0).   Electronically Signed   By: Garald Balding M.D.   On: 12/24/2017 00:46 I have independently reviewed the films.    Assessment & Plan:    1. Left ureteral stone/left UPJ stone Schedule left ureteroscopy with laser lithotripsy and ureteral stent placement  - explained to the patient how the procedure is performed and the risks involved  - informed patient that they will have a stent placed during the procedure and will remain in place after the procedure for a short time.   - stent may be removed in the  office with a cystoscope or patient may be instructed to remove the stent themselves by the string  - described "stent pain" as feelings of needing to urinate/overactive bladder and a warm, tingling sensation to intense pain in the affected flank  - residual stones within the kidney or ureter may be present after the procedure and may need to have these addressed at a different encounter  - injury to the ureter is the most common intra-operative risk, it may result in an open procedure to correct the defect  - infection and bleeding are also risks  - explained the risks of general anesthesia, such as: MI, CVA, paralysis, coma and/or death.  - advised to contact our office or seek treatment in the ED if becomes febrile or pain/ vomiting are difficult control in order to arrange for emergent/urgent intervention  - patient underwent URS in 2016 with Dr. Erlene Quan  2. Left UPJ  stone See above  3. Left hydronephrosis  - obtain RUS to ensure the hydronephrosis has resolved once they have passed and/or recovered from procedure to ensure to iatrogenic hydronephrosis remains - it is explained to the patient that it is important to document resolution of the hydronephrosis as "silent hydronephrosis" can occur and cause damage and/or loss of the kidney  4. Bilateral stones Will encourage 24 hour urine metabolic work up once recovered from URS  5. Abdominal aorta Will need to see vascular as he has not been seen since 2017    Return for left URS/LL/ureteral stent placement .  These notes generated with voice recognition software. I apologize for typographical errors.  Zara Council, PA-C  [facility] [address]  [address], [place] [address] [phone]

## 2017-12-24 NOTE — ED Provider Notes (Signed)
[facility] Emergency Department Provider Note   First MD Initiated Contact with Patient 12/24/17 0144     (approximate)  I have reviewed the triage vital signs and the nursing notes.   HISTORY  Chief Complaint Flank Pain    HPI [name] is a 58 y.o. male with below list of chronic medical conditions including previous kidney stone presents emergency department acute onset of left flank pain that is currently 10 out of 10 which began this evening.  Patient also admits to nausea however no vomiting.  Patient states that previous "kidney stones requiring lithotripsy which was performed twice.  Patient denies any fever afebrile on presentation temperature 97.9.   Past Medical History:  Diagnosis Date  . AAA (abdominal aortic aneurysm) ([place])   . Acid reflux 12/01/2013  . 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 BP (high blood pressure) 03/14/2015  . Calculus of kidney 03/14/2015  . COPD, mild ([place]) 03/25/2014  . HLD (hyperlipidemia) 12/01/2013  . Hypertension   . Renal stones     Patient Active Problem List   Diagnosis Date Noted  . AAA (abdominal aortic aneurysm) without rupture ([place]) 05/13/2016  . PVD (peripheral vascular disease) ([place]) 04/10/2016  . BP (high blood pressure) 03/14/2015  . Calculus of kidney 03/14/2015  . COPD, mild ([place]) 03/25/2014  . 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 Acid reflux 12/01/2013  . HLD (hyperlipidemia) 12/01/2013    Past Surgical History:  Procedure Laterality Date  . APPENDECTOMY    . CYSTOSCOPY W/ URETERAL STENT PLACEMENT Left 01/28/2015   Procedure: CYSTOSCOPY WITH RETROGRADE PYELOGRAM/URETERAL STENT PLACEMENT;  Surgeon: Alexis Frock, MD;  Location: [facility] ORS;  Service: Urology;  Laterality: Left;  . CYSTOSCOPY/URETEROSCOPY/HOLMIUM LASER/STENT PLACEMENT Left 02/08/2015   Procedure: CYSTOSCOPY/URETEROSCOPY/HOLMIUM LASER/STENT PLACEMENT;  Surgeon: Hollice Espy, MD;  Location: [facility] ORS;  Service: Urology;   Laterality: Left;  . FACIAL COSMETIC SURGERY    . LITHOTRIPSY      Prior to Admission medications   Medication Sig Start Date End Date Taking? Authorizing Provider  atorvastatin (LIPITOR) 10 MG tablet Take 10 mg by mouth every morning. 01/25/15   [provider]  esomeprazole (NEXIUM) 10 MG packet Take 10 mg by mouth daily before breakfast.    [provider]  fenofibrate (TRICOR) 145 MG tablet Take 145 mg by mouth daily. 01/17/15   [provider]  Ipratropium-Albuterol (COMBIVENT RESPIMAT) 20-100 MCG/ACT AERS respimat Inhale 1 puff into the lungs 4 (four) times daily as needed. 03/25/14   [provider]  lisinopril (PRINIVIL,ZESTRIL) 10 MG tablet Take 10 mg by mouth daily.    [provider]    Allergies No known drug allergies No family history on file.  Social History Social History   Tobacco Use  . Smoking status: Current Every Day Smoker    Packs/day: 10.00    Years: 30.00    Pack years: 300.00    Types: Cigarettes  . Smokeless tobacco: Never Used  Substance Use Topics  . Alcohol use: No    Alcohol/week: 0.0 oz  . Drug use: No    Review of Systems Constitutional: No fever/chills Eyes: No visual changes. ENT: No sore throat. Cardiovascular: Denies chest pain. Respiratory: Denies shortness of breath. Gastrointestinal: Positive for left flank pain no nausea, no vomiting.  No diarrhea.  No constipation. Genitourinary: Negative for dysuria. Musculoskeletal: Negative for neck pain.  Negative for back pain. Integumentary: Negative for rash. Neurological: Negative for headaches, focal weakness or numbness.  ____________________________________________   PHYSICAL EXAM:  VITAL SIGNS: ED Triage Vitals  Enc Vitals Group     BP 12/23/17 2340 (!) 160/88     Pulse Rate 12/23/17 2340 (!) 104     Resp 12/23/17 2340 20     Temp 12/23/17 2340 97.9 F (36.6 C)     Temp Source 12/23/17 2340 Oral     SpO2 12/23/17 2340 99 %      Weight 12/23/17 2342 105.7 kg (233 lb)     Height 12/23/17 2342 1.727 m (5\' 8" )     Head Circumference --      Peak Flow --      Pain Score 12/23/17 2354 10     Pain Loc --      Pain Edu? --      Excl. in [place]? --     Constitutional: Alert and oriented. Well appearing and in no acute distress. Eyes: Conjunctivae are normal. Head: Atraumatic. Mouth/Throat: Mucous membranes are moist.  Oropharynx non-erythematous. Neck: No stridor.   Cardiovascular: Normal rate, regular rhythm. Good peripheral circulation. Grossly normal heart sounds. Respiratory: Normal respiratory effort.  No retractions. Lungs CTAB. Gastrointestinal: Soft and nontender. No distention.  Musculoskeletal: No lower extremity tenderness nor edema. No gross deformities of extremities. Neurologic:  Normal speech and language. No gross focal neurologic deficits are appreciated.  Skin:  Skin is warm, dry and intact. No rash noted. Psychiatric: Mood and affect are normal. Speech and behavior are normal.  ____________________________________________   LABS (all labs ordered are listed, but only abnormal results are displayed)  Labs Reviewed  URINALYSIS, COMPLETE (UACMP) WITH MICROSCOPIC - Abnormal; Notable for the following components:      Result Value   Color, Urine YELLOW (*)    APPearance HAZY (*)    Glucose, UA 50 (*)    Hgb urine dipstick LARGE (*)    Protein, ur 30 (*)    RBC / HPF >50 (*)    All other components within normal limits  CBC WITH DIFFERENTIAL/PLATELET - Abnormal; Notable for the following components:   WBC 12.3 (*)    Neutro Abs 7.1 (*)    Lymphs Abs 4.0 (*)    All other components within normal limits  BASIC METABOLIC PANEL - Abnormal; Notable for the following components:   Glucose, Bld 138 (*)    BUN 24 (*)    Creatinine, Ser 1.29 (*)    All other components within normal limits   _  RADIOLOGY I, Walters N Ashutosh Dieguez, personally viewed and evaluated these images (plain radiographs) as part  of my medical decision making, as well as reviewing the written report by the radiologist.  ED MD interpretation: 5 mm left distal ureter stone with moderate ureteronephrosis.   Official radiology report(s): Ct Renal Stone Study  Result Date: 12/24/2017 CLINICAL DATA:  Acute onset of left flank pain radiating to the abdomen. EXAM: CT ABDOMEN AND PELVIS WITHOUT CONTRAST TECHNIQUE: Multidetector CT imaging of the abdomen and pelvis was performed following the standard protocol without IV contrast. COMPARISON:  CT of the abdomen and pelvis performed 01/28/2015, MRI of the lumbar spine performed 04/11/2016 FINDINGS: Lower chest: The visualized lung bases are grossly clear. Scattered coronary artery calcifications are seen. Hepatobiliary: There is diffuse fatty infiltration within the liver. The gallbladder is decompressed and unremarkable in appearance. The common bile duct is normal in caliber. Pancreas: The pancreas is within normal limits. Spleen: The spleen is unremarkable in appearance. Adrenals/Urinary Tract: The adrenal glands are unremarkable. Moderate  left-sided hydronephrosis is noted. An obstructing 5 mm stone is noted at the distal left ureter, 2 cm above the left vesicoureteral junction. A non-obstructing 8 mm stone is noted at the left renal pelvis. Scattered nonobstructing bilateral renal stones are seen, measuring up to 8 mm in size. Nonspecific perinephric stranding is noted bilaterally. Stomach/Bowel: The stomach is unremarkable in appearance. The small bowel is within normal limits. The appendix is not visualized; there is no evidence for appendicitis. Mild scattered diverticulosis is noted along the transverse, descending and sigmoid colon, without evidence of diverticulitis. Vascular/Lymphatic: Diffuse calcification is seen along the abdominal aorta and its branches. There is aneurysmal dilatation of the infrarenal abdominal aorta to 3.7 cm in AP dimension and 3.8 cm in transverse dimension,  which resolves proximal to the aortic bifurcation. The inferior vena cava is grossly unremarkable. No retroperitoneal lymphadenopathy is seen. No pelvic sidewall lymphadenopathy is identified. Reproductive: The bladder is mildly distended and grossly unremarkable. The prostate is normal in size. Other: No additional soft tissue abnormalities are seen. Musculoskeletal: No acute osseous abnormalities are identified. The visualized musculature is unremarkable in appearance. IMPRESSION: 1. Moderate left-sided hydronephrosis, with an obstructing 5 mm stone at the distal left ureter, 2 cm above the left vesicoureteral junction. 2. Scattered nonobstructing bilateral renal stones, measuring up to 8 mm in size. Nonobstructing 8 mm stone also noted at the left renal pelvis. 3. Aneurysmal dilatation of the infrarenal abdominal aorta to 3.7 cm in AP dimension and 3.8 cm in transverse dimension, which resolves proximal to the aortic bifurcation. Recommend followup by ultrasound in 2 years. This recommendation follows ACR consensus guidelines: White Paper of the ACR Incidental Findings Committee II on Vascular Findings. J Am Coll Radiol 2013; 10:789-794. 4. Scattered coronary artery calcifications seen. 5. Diffuse fatty infiltration within the liver. 6. Mild scattered diverticulosis along the transverse, descending and sigmoid colon, without evidence of diverticulitis. Aortic Atherosclerosis (ICD10-I70.0). Electronically Signed   By: Garald Balding M.D.   On: 12/24/2017 00:46      Procedures   ____________________________________________   INITIAL IMPRESSION / ASSESSMENT AND PLAN / ED COURSE  As part of my medical decision making, I reviewed the following data within the electronic MEDICAL RECORD NUMBER  57 year old male presented with above-stated history and physical exam consistent with possible kidney stone and a stat CT renal protocol was performed which informed evidence of kidney stone.  Patient given IV morphine  4 mg x 2 with improvement of pain.  Patient also given IV Zofran as well as 1 L IV normal saline.  She will be referred to urology for further outpatient evaluation and management.  ____________________________________________  FINAL CLINICAL IMPRESSION(S) / ED DIAGNOSES  Final diagnoses:  Kidney stone on left side     MEDICATIONS GIVEN DURING THIS VISIT:  Medications  sodium chloride 0.9 % bolus 1,000 mL (has no administration in time range)  morphine 4 MG/ML injection 4 mg (has no administration in time range)  ondansetron (ZOFRAN) injection 4 mg (has no administration in time range)  oxyCODONE-acetaminophen (PERCOCET/ROXICET) 5-325 MG per tablet 2 tablet (2 tablets Oral Given 12/24/17 0010)     ED Discharge Orders    None       Note:  This document was prepared using Dragon voice recognition software and may include unintentional dictation errors.    Gregor Hams, MD 12/24/17 [phone]

## 2017-12-24 NOTE — H&P (View-Only) (Signed)
12/25/2017 2:25 PM   [name] 04/27/1961 [id number]  Referring provider: Sofie Hartigan, MD [address] [place], [place] [address]  Chief Complaint  Patient presents with  . Nephrolithiasis    New Patient    HPI: Patient is a 57 year old Caucasian male who was referred by [facility] ED for a ureteral stone.  He presented to the ED on 12/24/2017 for left flank pain and nausea.  CT Renal Stone study noted moderate left-sided hydronephrosis, with an obstructing 5 mm stone at the distal left ureter, 2 cm above the left vesicoureteral junction.  Scattered nonobstructing bilateral renal stones, measuring up to 8 mm in size. Nonobstructing 8 mm stone also noted at the left renal pelvis.  Creatinine 1.29.  UA > 50 RBC's.  WBC's 12.3.    He was given morphine, Percocet, Zofran and Flomax in the ER.  He was discharged with Percocet and Flomax.     He has had ESWL in the past and URS in the past.   Most recent URS was in 2016 with Dr. Erlene Quan.    Today, he is experiencing urgency, nocturia, incontinence, intermittency, hesitancy, straining to urinate, gross hematuria and a weak urinary stream.  He is also having left flank pain and left groin pain.  The pain was intense enough that he had to leave work.  Patient denies any gross hematuria, dysuria or suprapubic/flank pain.  Patient denies any fevers, chills, nausea or vomiting.   PMH: Past Medical History:  Diagnosis Date  . AAA (abdominal aortic aneurysm) ([place])   . Acid reflux 12/01/2013  . 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 BP (high blood pressure) 03/14/2015  . Calculus of kidney 03/14/2015  . COPD, mild ([place]) 03/25/2014  . HLD (hyperlipidemia) 12/01/2013  . Hypertension   . Renal stones     Surgical History: Past Surgical History:  Procedure Laterality Date  . APPENDECTOMY    . CYSTOSCOPY W/ URETERAL STENT PLACEMENT Left 01/28/2015   Procedure: CYSTOSCOPY WITH RETROGRADE PYELOGRAM/URETERAL STENT PLACEMENT;  Surgeon:  Alexis Frock, MD;  Location: [facility] ORS;  Service: Urology;  Laterality: Left;  . CYSTOSCOPY/URETEROSCOPY/HOLMIUM LASER/STENT PLACEMENT Left 02/08/2015   Procedure: CYSTOSCOPY/URETEROSCOPY/HOLMIUM LASER/STENT PLACEMENT;  Surgeon: Hollice Espy, MD;  Location: [facility] ORS;  Service: Urology;  Laterality: Left;  . FACIAL COSMETIC SURGERY    . LITHOTRIPSY      Home Medications:  Allergies as of 12/25/2017   No Known Allergies     Medication List        Accurate as of 12/25/17  2:25 PM. Always use your most recent med list.          acetaminophen 650 MG CR tablet Commonly known as:  TYLENOL Take by mouth.   atorvastatin 10 MG tablet Commonly known as:  LIPITOR Take 10 mg by mouth every morning.   COMBIVENT RESPIMAT 20-100 MCG/ACT Aers respimat Generic drug:  Ipratropium-Albuterol Inhale 1 puff into the lungs 4 (four) times daily as needed.   esomeprazole 10 MG packet Commonly known as:  NEXIUM Take 10 mg by mouth daily before breakfast.   fenofibrate 145 MG tablet Commonly known as:  TRICOR Take 145 mg by mouth daily.   lisinopril 10 MG tablet Commonly known as:  PRINIVIL,ZESTRIL Take 10 mg by mouth daily.   meloxicam 15 MG tablet Commonly known as:  MOBIC Take 15 mg by mouth daily.   metFORMIN 500 MG tablet Commonly known as:  GLUCOPHAGE Take 500 mg by mouth 2 (two) times daily  with a meal.   MULTI-VITAMINS Tabs Take by mouth.   ondansetron 4 MG disintegrating tablet Commonly known as:  ZOFRAN ODT Take 1 tablet (4 mg total) by mouth every 8 (eight) hours as needed for nausea or vomiting.   oxyCODONE-acetaminophen 5-325 MG tablet Commonly known as:  PERCOCET Take 1 tablet by mouth every 4 (four) hours as needed for severe pain.   tamsulosin 0.4 MG Caps capsule Commonly known as:  FLOMAX Take 1 capsule (0.4 mg total) by mouth daily after breakfast.       Allergies: No Known Allergies  Family History: Family History  Problem Relation Age of Onset  .  Prostate cancer Neg Hx   . Bladder Cancer Neg Hx   . Kidney cancer Neg Hx     Social History:  reports that he has been smoking cigarettes.  He has a 300.00 pack-year smoking history. He has never used smokeless tobacco. He reports that he does not drink alcohol or use drugs.  ROS: UROLOGY Frequent Urination?: No Hard to postpone urination?: Yes Burning/pain with urination?: No Get up at night to urinate?: Yes Leakage of urine?: Yes Urine stream starts and stops?: Yes Trouble starting stream?: Yes Do you have to strain to urinate?: Yes Blood in urine?: Yes Urinary tract infection?: No Sexually transmitted disease?: No Injury to kidneys or bladder?: No Painful intercourse?: No Weak stream?: Yes Erection problems?: Yes Penile pain?: No  Gastrointestinal Nausea?: Yes Vomiting?: Yes Indigestion/heartburn?: Yes Diarrhea?: No Constipation?: Yes  Constitutional Fever: No Night sweats?: No Weight loss?: No Fatigue?: No  Skin Skin rash/lesions?: No Itching?: No  Eyes Blurred vision?: No Double vision?: No  Ears/Nose/Throat Sore throat?: No Sinus problems?: Yes  Hematologic/Lymphatic Swollen glands?: No Easy bruising?: No  Cardiovascular Leg swelling?: No Chest pain?: No  Respiratory Cough?: No Shortness of breath?: Yes  Endocrine Excessive thirst?: No  Musculoskeletal Back pain?: Yes Joint pain?: No  Neurological Headaches?: No Dizziness?: No  Psychologic Depression?: No Anxiety?: No  Physical Exam: BP 121/78 (BP Location: Right Arm, Patient Position: Sitting, Cuff Size: Large)   Pulse (!) 106   Ht 5\' 8"  (1.727 m)   Wt 232 lb 14.4 oz (105.6 kg)   BMI 35.41 kg/m   Constitutional:  Well nourished. Alert and oriented, No acute distress. HEENT: [place] AT, moist mucus membranes.  Trachea midline, no masses. Cardiovascular: No clubbing, cyanosis, or edema. Respiratory: Normal respiratory effort, no increased work of breathing. GI: Abdomen is soft,  non tender, non distended, no abdominal masses. Liver and spleen not palpable.  No hernias appreciated.  Stool sample for occult testing is not indicated.   GU: No CVA tenderness.  No bladder fullness or masses.   Skin: No rashes, bruises or suspicious lesions. Lymph: No cervical or inguinal adenopathy. Neurologic: Grossly intact, no focal deficits, moving all 4 extremities. Psychiatric: Normal mood and affect.  Laboratory Data: Lab Results  Component Value Date   WBC 12.3 (H) 12/23/2017   HGB 15.2 12/23/2017   HCT 45.2 12/23/2017   MCV 94.6 12/23/2017   PLT 295 12/23/2017    Lab Results  Component Value Date   CREATININE 1.29 (H) 12/23/2017    No results found for: PSA  No results found for: TESTOSTERONE  No results found for: HGBA1C  No results found for: TSH  No results found for: CHOL, HDL, CHOLHDL, VLDL, LDLCALC  Lab Results  Component Value Date   AST 25 01/28/2015   Lab Results  Component Value Date   ALT  29 01/28/2015   No components found for: ALKALINEPHOPHATASE No components found for: BILIRUBINTOTAL  No results found for: ESTRADIOL  Urinalysis Negative.  See Epic.   I have reviewed the labs.   Pertinent Imaging: CLINICAL DATA:  Acute onset of left flank pain radiating to the abdomen.  EXAM: CT ABDOMEN AND PELVIS WITHOUT CONTRAST  TECHNIQUE: Multidetector CT imaging of the abdomen and pelvis was performed following the standard protocol without IV contrast.  COMPARISON:  CT of the abdomen and pelvis performed 01/28/2015, MRI of the lumbar spine performed 04/11/2016  FINDINGS: Lower chest: The visualized lung bases are grossly clear. Scattered coronary artery calcifications are seen.  Hepatobiliary: There is diffuse fatty infiltration within the liver. The gallbladder is decompressed and unremarkable in appearance. The common bile duct is normal in caliber.  Pancreas: The pancreas is within normal limits.  Spleen: The spleen  is unremarkable in appearance.  Adrenals/Urinary Tract: The adrenal glands are unremarkable.  Moderate left-sided hydronephrosis is noted. An obstructing 5 mm stone is noted at the distal left ureter, 2 cm above the left vesicoureteral junction.  A non-obstructing 8 mm stone is noted at the left renal pelvis. Scattered nonobstructing bilateral renal stones are seen, measuring up to 8 mm in size. Nonspecific perinephric stranding is noted bilaterally.  Stomach/Bowel: The stomach is unremarkable in appearance. The small bowel is within normal limits. The appendix is not visualized; there is no evidence for appendicitis.  Mild scattered diverticulosis is noted along the transverse, descending and sigmoid colon, without evidence of diverticulitis.  Vascular/Lymphatic: Diffuse calcification is seen along the abdominal aorta and its branches. There is aneurysmal dilatation of the infrarenal abdominal aorta to 3.7 cm in AP dimension and 3.8 cm in transverse dimension, which resolves proximal to the aortic bifurcation.  The inferior vena cava is grossly unremarkable. No retroperitoneal lymphadenopathy is seen. No pelvic sidewall lymphadenopathy is identified.  Reproductive: The bladder is mildly distended and grossly unremarkable. The prostate is normal in size.  Other: No additional soft tissue abnormalities are seen.  Musculoskeletal: No acute osseous abnormalities are identified. The visualized musculature is unremarkable in appearance.  IMPRESSION: 1. Moderate left-sided hydronephrosis, with an obstructing 5 mm stone at the distal left ureter, 2 cm above the left vesicoureteral junction. 2. Scattered nonobstructing bilateral renal stones, measuring up to 8 mm in size. Nonobstructing 8 mm stone also noted at the left renal pelvis. 3. Aneurysmal dilatation of the infrarenal abdominal aorta to 3.7 cm in AP dimension and 3.8 cm in transverse dimension, which  resolves proximal to the aortic bifurcation. Recommend followup by ultrasound in 2 years. This recommendation follows ACR consensus guidelines: White Paper of the ACR Incidental Findings Committee II on Vascular Findings. J Am Coll Radiol 2013; 10:789-794. 4. Scattered coronary artery calcifications seen. 5. Diffuse fatty infiltration within the liver. 6. Mild scattered diverticulosis along the transverse, descending and sigmoid colon, without evidence of diverticulitis.  Aortic Atherosclerosis (ICD10-I70.0).   Electronically Signed   By: Garald Balding M.D.   On: 12/24/2017 00:46 I have independently reviewed the films.    Assessment & Plan:    1. Left ureteral stone/left UPJ stone Schedule left ureteroscopy with laser lithotripsy and ureteral stent placement  - explained to the patient how the procedure is performed and the risks involved  - informed patient that they will have a stent placed during the procedure and will remain in place after the procedure for a short time.   - stent may be removed in the  office with a cystoscope or patient may be instructed to remove the stent themselves by the string  - described "stent pain" as feelings of needing to urinate/overactive bladder and a warm, tingling sensation to intense pain in the affected flank  - residual stones within the kidney or ureter may be present after the procedure and may need to have these addressed at a different encounter  - injury to the ureter is the most common intra-operative risk, it may result in an open procedure to correct the defect  - infection and bleeding are also risks  - explained the risks of general anesthesia, such as: MI, CVA, paralysis, coma and/or death.  - advised to contact our office or seek treatment in the ED if becomes febrile or pain/ vomiting are difficult control in order to arrange for emergent/urgent intervention  - patient underwent URS in 2016 with Dr. Erlene Quan  2. Left UPJ  stone See above  3. Left hydronephrosis  - obtain RUS to ensure the hydronephrosis has resolved once they have passed and/or recovered from procedure to ensure to iatrogenic hydronephrosis remains - it is explained to the patient that it is important to document resolution of the hydronephrosis as "silent hydronephrosis" can occur and cause damage and/or loss of the kidney  4. Bilateral stones Will encourage 24 hour urine metabolic work up once recovered from URS  5. Abdominal aorta Will need to see vascular as he has not been seen since 2017    Return for left URS/LL/ureteral stent placement .  These notes generated with voice recognition software. I apologize for typographical errors.  Zara Council, PA-C  [facility] [address]  [place], [place] [address] [phone]

## 2017-12-25 ENCOUNTER — Ambulatory Visit (INDEPENDENT_AMBULATORY_CARE_PROVIDER_SITE_OTHER): Payer: BLUE CROSS/BLUE SHIELD | Admitting: Urology

## 2017-12-25 ENCOUNTER — Encounter: Payer: Self-pay | Admitting: Urology

## 2017-12-25 ENCOUNTER — Encounter: Payer: Self-pay | Admitting: Radiology

## 2017-12-25 VITALS — BP 121/78 | HR 106 | Ht 68.0 in | Wt 232.9 lb

## 2017-12-25 DIAGNOSIS — N200 Calculus of kidney: Secondary | ICD-10-CM | POA: Diagnosis not present

## 2017-12-25 DIAGNOSIS — N201 Calculus of ureter: Secondary | ICD-10-CM

## 2017-12-25 DIAGNOSIS — I714 Abdominal aortic aneurysm, without rupture, unspecified: Secondary | ICD-10-CM

## 2017-12-25 DIAGNOSIS — R3129 Other microscopic hematuria: Secondary | ICD-10-CM | POA: Diagnosis not present

## 2017-12-25 DIAGNOSIS — N132 Hydronephrosis with renal and ureteral calculous obstruction: Secondary | ICD-10-CM | POA: Diagnosis not present

## 2017-12-25 LAB — URINALYSIS, COMPLETE
BILIRUBIN UA: NEGATIVE
Glucose, UA: NEGATIVE
KETONES UA: NEGATIVE
Leukocytes, UA: NEGATIVE
Nitrite, UA: NEGATIVE
PROTEIN UA: NEGATIVE
RBC UA: NEGATIVE
SPEC GRAV UA: 1.015 (ref 1.005–1.030)
Urobilinogen, Ur: 1 mg/dL (ref 0.2–1.0)
pH, UA: 5.5 (ref 5.0–7.5)

## 2017-12-25 LAB — MICROSCOPIC EXAMINATION
Bacteria, UA: NONE SEEN
EPITHELIAL CELLS (NON RENAL): NONE SEEN /HPF (ref 0–10)

## 2017-12-26 ENCOUNTER — Other Ambulatory Visit: Payer: Self-pay | Admitting: Radiology

## 2017-12-26 DIAGNOSIS — N201 Calculus of ureter: Secondary | ICD-10-CM

## 2017-12-28 LAB — CULTURE, URINE COMPREHENSIVE

## 2017-12-29 ENCOUNTER — Ambulatory Visit (INDEPENDENT_AMBULATORY_CARE_PROVIDER_SITE_OTHER): Payer: BLUE CROSS/BLUE SHIELD | Admitting: Vascular Surgery

## 2017-12-29 ENCOUNTER — Encounter (INDEPENDENT_AMBULATORY_CARE_PROVIDER_SITE_OTHER): Payer: Self-pay | Admitting: Vascular Surgery

## 2017-12-29 ENCOUNTER — Telehealth: Payer: Self-pay

## 2017-12-29 VITALS — BP 142/84 | HR 95 | Resp 17 | Ht 68.0 in | Wt 232.6 lb

## 2017-12-29 DIAGNOSIS — K219 Gastro-esophageal reflux disease without esophagitis: Secondary | ICD-10-CM

## 2017-12-29 DIAGNOSIS — I739 Peripheral vascular disease, unspecified: Secondary | ICD-10-CM | POA: Diagnosis not present

## 2017-12-29 DIAGNOSIS — I714 Abdominal aortic aneurysm, without rupture, unspecified: Secondary | ICD-10-CM

## 2017-12-29 DIAGNOSIS — I10 Essential (primary) hypertension: Secondary | ICD-10-CM | POA: Diagnosis not present

## 2017-12-29 DIAGNOSIS — N200 Calculus of kidney: Secondary | ICD-10-CM | POA: Diagnosis not present

## 2017-12-29 DIAGNOSIS — J449 Chronic obstructive pulmonary disease, unspecified: Secondary | ICD-10-CM

## 2017-12-29 MED ORDER — AMPICILLIN 500 MG PO CAPS: 500.0000 mg | ORAL_CAPSULE | Freq: Three times a day (TID) | ORAL | 0 refills | Status: AC

## 2017-12-29 NOTE — Telephone Encounter (Signed)
Pt informed, after speaking with Larene Beach, we have sent in Ampicillin 500mg  TID for 10 days. Sent to [facility] per pt request.

## 2017-12-29 NOTE — Telephone Encounter (Signed)
-----   Message from Nori Riis, PA-C sent at 12/28/2017  6:12 PM EDT ----- Please let Mr. Hoffert know that his urine culture was positive and we nee to start Ampicillin 875 mg, one tablet twice daily for ten days  He has URS scheduled for the 19th, so he needs to start the antibiotic right away.

## 2017-12-29 NOTE — Progress Notes (Signed)
MRN : [id number]  [name] is a 57 y.o. (15-Nov-1960) male who presents with chief complaint of  Chief Complaint  Patient presents with  . Follow-up    Surgery Clearance  .  History of Present Illness:   The patient returns to the office for surveillance of a known abdominal aortic aneurysm. Patient denies abdominal pain or back pain, no other abdominal complaints. No changes suggesting embolic episodes.   There have been no interval changes in the patient's overall health care since his last visit.  Patient denies amaurosis fugax or TIA symptoms. There is no history of claudication or rest pain symptoms of the lower extremities. The patient denies angina or shortness of breath.   CT abdomen of the abdomen and pelvis shows an AAA measured 3.5 cm aneurysms.  No significant change compared to the previous study.  Current Meds  Medication Sig  . acetaminophen (TYLENOL) 650 MG CR tablet Take by mouth.  Marland Kitchen ampicillin (PRINCIPEN) 500 MG capsule Take 1 capsule (500 mg total) by mouth 3 (three) times daily for 10 days.  Marland Kitchen atorvastatin (LIPITOR) 10 MG tablet Take 10 mg by mouth every morning.  Marland Kitchen esomeprazole (NEXIUM) 10 MG packet Take 10 mg by mouth daily before breakfast.  . fenofibrate (TRICOR) 145 MG tablet Take 145 mg by mouth daily.  . Ipratropium-Albuterol (COMBIVENT RESPIMAT) 20-100 MCG/ACT AERS respimat Inhale 1 puff into the lungs 4 (four) times daily as needed.  Marland Kitchen lisinopril (PRINIVIL,ZESTRIL) 10 MG tablet Take 10 mg by mouth daily.  . meloxicam (MOBIC) 15 MG tablet Take 15 mg by mouth daily.  . metFORMIN (GLUCOPHAGE) 500 MG tablet Take 500 mg by mouth 2 (two) times daily with a meal.  . Multiple Vitamin (MULTI-VITAMINS) TABS Take by mouth.  . ondansetron (ZOFRAN ODT) 4 MG disintegrating tablet Take 1 tablet (4 mg total) by mouth every 8 (eight) hours as needed for nausea or vomiting.  Marland Kitchen oxyCODONE-acetaminophen (PERCOCET) 5-325 MG tablet Take 1 tablet by mouth every 4  (four) hours as needed for severe pain.  . tamsulosin (FLOMAX) 0.4 MG CAPS capsule Take 1 capsule (0.4 mg total) by mouth daily after breakfast.    Past Medical History:  Diagnosis Date  . AAA (abdominal aortic aneurysm) ([place])   . Acid reflux 12/01/2013  . 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 BP (high blood pressure) 03/14/2015  . Calculus of kidney 03/14/2015  . COPD, mild ([place]) 03/25/2014  . HLD (hyperlipidemia) 12/01/2013  . Hypertension   . Renal stones     Past Surgical History:  Procedure Laterality Date  . APPENDECTOMY    . CYSTOSCOPY W/ URETERAL STENT PLACEMENT Left 01/28/2015   Procedure: CYSTOSCOPY WITH RETROGRADE PYELOGRAM/URETERAL STENT PLACEMENT;  Surgeon: Alexis Frock, MD;  Location: [facility] ORS;  Service: Urology;  Laterality: Left;  . CYSTOSCOPY/URETEROSCOPY/HOLMIUM LASER/STENT PLACEMENT Left 02/08/2015   Procedure: CYSTOSCOPY/URETEROSCOPY/HOLMIUM LASER/STENT PLACEMENT;  Surgeon: Hollice Espy, MD;  Location: [facility] OR[facility];  Service: Urology;  Laterality: Left;  . FACIAL COSMETIC SURGERY    . LITHOTRIPSY      Social History Social History   Tobacco Use  . Smoking status: Current Every Day Smoker    Packs/day: 10.00    Years: 30.00    Pack years: 300.00    Types: Cigarettes  . Smokeless tobacco: Never Used  Substance Use Topics  . Alcohol use: No    Alcohol/week: 0.0 oz  . Drug use: No    Family History Family History  Problem Relation Age of  Onset  . Prostate cancer Neg Hx   . Bladder Cancer Neg Hx   . Kidney cancer Neg Hx     No Known Allergies   REVIEW OF SYSTEMS (Negative unless checked)  Constitutional: [] Weight loss  [] Fever  [] Chills Cardiac: [] Chest pain   [] Chest pressure   [] Palpitations   [] Shortness of breath when laying flat   [] Shortness of breath with exertion. Vascular:  [x] Pain in legs with walking   [] Pain in legs at rest  [] History of DVT   [] Phlebitis   [] Swelling in legs   [] Varicose veins   [] Non-healing ulcers Pulmonary:   [] Uses  home oxygen   [] Productive cough   [] Hemoptysis   [] Wheeze  [] COPD   [] Asthma Neurologic:  [] Dizziness   [] Seizures   [] History of stroke   [] History of TIA  [] Aphasia   [] Vissual changes   [] Weakness or numbness in arm   [] Weakness or numbness in leg Musculoskeletal:   [] Joint swelling   [] Joint pain   [] Low back pain Hematologic:  [] Easy bruising  [] Easy bleeding   [] Hypercoagulable state   [] Anemic Gastrointestinal:  [] Diarrhea   [] Vomiting  [] Gastroesophageal reflux/heartburn   [] Difficulty swallowing. Genitourinary:  [] Chronic kidney disease   [] Difficult urination  [] Frequent urination   [x] Blood in urine Skin:  [] Rashes   [] Ulcers  Psychological:  [] History of anxiety   []  History of major depression.  Physical Examination  Vitals:   12/29/17 1056  BP: (!) 142/84  Pulse: 95  Resp: 17  Weight: 232 lb 9.6 oz (105.5 kg)  Height: 5\' 8"  (1.727 m)   Body mass index is 35.37 kg/m. Gen: WD/WN, NAD Head: [place]/AT, No temporalis wasting.  Ear/Nose/Throat: Hearing grossly intact, nares w/o erythema or drainage Eyes: PER, EOMI, sclera nonicteric.  Neck: Supple, no large masses.   Pulmonary:  Good air movement, no audible wheezing bilaterally, no use of accessory muscles.  Cardiac: RRR, no JVD Vascular:  Vessel Right Left  Radial Palpable Palpable  PT Not Palpable Not Palpable  DP Not Palpable Not Palpable  Gastrointestinal: Non-distended. No guarding/no peritoneal signs.  Musculoskeletal: M/S 5/5 throughout.  No deformity or atrophy.  Neurologic: CN 2-12 intact. Symmetrical.  Speech is fluent. Motor exam as listed above. Psychiatric: Judgment intact, Mood & affect appropriate for pt's clinical situation. Dermatologic: No rashes or ulcers noted.  No changes consistent with cellulitis. Lymph : No lichenification or skin changes of chronic lymphedema.  CBC Lab Results  Component Value Date   WBC 12.3 (H) 12/23/2017   HGB 15.2 12/23/2017   HCT 45.2 12/23/2017   MCV 94.6 12/23/2017     PLT 295 12/23/2017    BMET    Component Value Date/Time   NA 137 12/23/2017 2347   NA 135 (L) 09/25/2012 1155   K 4.6 12/23/2017 2347   K 3.7 09/25/2012 1155   CL 103 12/23/2017 2347   CL 102 09/25/2012 1155   CO2 25 12/23/2017 2347   CO2 26 09/25/2012 1155   GLUCOSE 138 (H) 12/23/2017 2347   GLUCOSE 95 09/25/2012 1155   BUN 24 (H) 12/23/2017 2347   BUN 16 09/25/2012 1155   CREATININE 1.29 (H) 12/23/2017 2347   CREATININE 0.90 09/25/2012 1155   CALCIUM 9.2 12/23/2017 2347   CALCIUM 8.8 09/25/2012 1155   GFRNONAA >60 12/23/2017 2347   GFRNONAA >60 09/25/2012 1155   GFRAA >60 12/23/2017 2347   GFRAA >60 09/25/2012 1155   Estimated Creatinine Clearance: 75.2 mL/min (A) (by C-G formula based on SCr of  1.29 mg/dL (H)).  COAG No results found for: INR, PROTIME  Radiology Ct Renal Stone Study  Result Date: 12/24/2017 CLINICAL DATA:  Acute onset of left flank pain radiating to the abdomen. EXAM: CT ABDOMEN AND PELVIS WITHOUT CONTRAST TECHNIQUE: Multidetector CT imaging of the abdomen and pelvis was performed following the standard protocol without IV contrast. COMPARISON:  CT of the abdomen and pelvis performed 01/28/2015, MRI of the lumbar spine performed 04/11/2016 FINDINGS: Lower chest: The visualized lung bases are grossly clear. Scattered coronary artery calcifications are seen. Hepatobiliary: There is diffuse fatty infiltration within the liver. The gallbladder is decompressed and unremarkable in appearance. The common bile duct is normal in caliber. Pancreas: The pancreas is within normal limits. Spleen: The spleen is unremarkable in appearance. Adrenals/Urinary Tract: The adrenal glands are unremarkable. Moderate left-sided hydronephrosis is noted. An obstructing 5 mm stone is noted at the distal left ureter, 2 cm above the left vesicoureteral junction. A non-obstructing 8 mm stone is noted at the left renal pelvis. Scattered nonobstructing bilateral renal stones are seen,  measuring up to 8 mm in size. Nonspecific perinephric stranding is noted bilaterally. Stomach/Bowel: The stomach is unremarkable in appearance. The small bowel is within normal limits. The appendix is not visualized; there is no evidence for appendicitis. Mild scattered diverticulosis is noted along the transverse, descending and sigmoid colon, without evidence of diverticulitis. Vascular/Lymphatic: Diffuse calcification is seen along the abdominal aorta and its branches. There is aneurysmal dilatation of the infrarenal abdominal aorta to 3.7 cm in AP dimension and 3.8 cm in transverse dimension, which resolves proximal to the aortic bifurcation. The inferior vena cava is grossly unremarkable. No retroperitoneal lymphadenopathy is seen. No pelvic sidewall lymphadenopathy is identified. Reproductive: The bladder is mildly distended and grossly unremarkable. The prostate is normal in size. Other: No additional soft tissue abnormalities are seen. Musculoskeletal: No acute osseous abnormalities are identified. The visualized musculature is unremarkable in appearance. IMPRESSION: 1. Moderate left-sided hydronephrosis, with an obstructing 5 mm stone at the distal left ureter, 2 cm above the left vesicoureteral junction. 2. Scattered nonobstructing bilateral renal stones, measuring up to 8 mm in size. Nonobstructing 8 mm stone also noted at the left renal pelvis. 3. Aneurysmal dilatation of the infrarenal abdominal aorta to 3.7 cm in AP dimension and 3.8 cm in transverse dimension, which resolves proximal to the aortic bifurcation. Recommend followup by ultrasound in 2 years. This recommendation follows ACR consensus guidelines: White Paper of the ACR Incidental Findings Committee II on Vascular Findings. J Am Coll Radiol 2013; 10:789-794. 4. Scattered coronary artery calcifications seen. 5. Diffuse fatty infiltration within the liver. 6. Mild scattered diverticulosis along the transverse, descending and sigmoid colon,  without evidence of diverticulitis. Aortic Atherosclerosis (ICD10-I70.0). Electronically Signed   By: Garald Balding M.D.   On: 12/24/2017 00:46      Assessment/Plan 1. AAA (abdominal aortic aneurysm) without rupture (HCC) No surgery or intervention at this time. The patient has an asymptomatic abdominal aortic aneurysm that is 3.5 cm in maximal diameter.  I have discussed the natural history of abdominal aortic aneurysm and the small risk of rupture for aneurysm less than 5 cm in size.  However, as these small aneurysms tend to enlarge over time, continued surveillance with ultrasound or CT scan is mandatory.  I have also discussed optimizing medical management with hypertension and lipid control and the importance of abstinence from tobacco.  The patient is also encouraged to exercise a minimum of 30 minutes 4 times a week.  Should the patient develop new onset abdominal or back pain or signs of peripheral embolization they are instructed to seek medical attention immediately and to alert the physician providing care that they have an aneurysm.  The patient voices their understanding. The patient will return in 24 months with an aortic duplex.   2. PVD (peripheral vascular disease) ([place])  Recommend:  The patient has evidence of atherosclerosis of the lower extremities with claudication.  The patient does not voice lifestyle limiting changes at this point in time.  Noninvasive studies do not suggest clinically significant change.  No invasive studies, angiography or surgery at this time The patient should continue walking and begin a more formal exercise program.  The patient should continue antiplatelet therapy and aggressive treatment of the lipid abnormalities  No changes in the patient's medications at this time  The patient should continue wearing graduated compression socks 10-15 mmHg strength to control the mild edema.    3. Calculus of kidney Patient is cleared for Urology  surgery  4. Gastroesophageal reflux disease, esophagitis presence not specified Continue antihypertensive medications as already ordered, these medications have been reviewed and there are no changes at this time.  Avoidence of caffeine and alcohol  Moderate elevation of the head of the bed   5. COPD, mild ([place]) Continue pulmonary medications and aerosols as already ordered, these medications have been reviewed and there are no changes at this time.    6. Essential hypertension Continue antihypertensive medications as already ordered, these medications have been reviewed and there are no changes at this time.     Hortencia Pilar, MD  12/29/2017 8:36 PM

## 2017-12-30 ENCOUNTER — Telehealth: Payer: Self-pay | Admitting: Radiology

## 2017-12-30 NOTE — Telephone Encounter (Signed)
error 

## 2018-01-02 ENCOUNTER — Encounter
Admission: RE | Admit: 2018-01-02 | Discharge: 2018-01-02 | Disposition: A | Discharge status: Home / Self Care | Payer: BLUE CROSS/BLUE SHIELD | Source: Ambulatory Visit | Attending: Urology | Admitting: Urology

## 2018-01-02 ENCOUNTER — Other Ambulatory Visit: Payer: Self-pay

## 2018-01-02 DIAGNOSIS — Z01810 Encounter for preprocedural cardiovascular examination: Secondary | ICD-10-CM | POA: Diagnosis not present

## 2018-01-02 DIAGNOSIS — I10 Essential (primary) hypertension: Secondary | ICD-10-CM | POA: Insufficient documentation

## 2018-01-02 DIAGNOSIS — J984 Other disorders of lung: Secondary | ICD-10-CM | POA: Diagnosis not present

## 2018-01-02 HISTORY — DX: Unspecified osteoarthritis, unspecified site: M19.90

## 2018-01-02 HISTORY — DX: Personal history of urinary calculi: Z87.442

## 2018-01-02 NOTE — Patient Instructions (Signed)
Your procedure is scheduled on: Wednesday, January 07, 2018 Report to Day Surgery on the 2nd floor of the [facility]. To find out your arrival time, please call [phone] between 1PM - 3PM on: Tuesday, January 06, 2018  REMEMBER: Instructions that are not followed completely may result in serious medical risk, up to and including death; or upon the discretion of your surgeon and anesthesiologist your surgery may need to be rescheduled.  Do not eat food after midnight the night before your procedure.  No gum chewing, lozengers or hard candies.  You may however, drink CLEAR liquids up to 2 hours before you are scheduled to arrive for your surgery. Do not drink anything within 2 hours of the start of your surgery.  Clear liquids include: - water  - apple juice without pulp - clear gatorade - black coffee or tea (Do NOT add anything to the coffee or tea) Do NOT drink anything that is not on this list.  No Alcohol for 24 hours before or after surgery.  No Smoking including e-cigarettes for 24 hours prior to surgery.  No chewable tobacco products for at least 6 hours prior to surgery.  No nicotine patches on the day of surgery.  On the morning of surgery brush your teeth with toothpaste and water, you may rinse your mouth with mouthwash if you wish. Do not swallow any toothpaste or mouthwash.  Notify your doctor if there is any change in your medical condition (cold, fever, infection).  Do not wear jewelry, make-up, hairpins, clips or nail polish.  Do not wear lotions, powders, or perfumes. You may wear deodorant.  Do not shave 48 hours prior to surgery. Men may shave face and neck.  Contacts and dentures may not be worn into surgery.  Do not bring valuables to the hospital, including drivers license, insurance or credit cards.  [facility] is not responsible for any belongings or valuables.   TAKE THESE MEDICATIONS THE MORNING OF SURGERY:  1.  AMPICILLIN 2.  [place] (take one  the night before surgery and one the morning of surgery - helps prevent nausea after surgery) 3.  COMBIVENT INHALER 4.  FLOMAX 5.  OXYCODONE (if needed for pain)  Use inhalers on the day of surgery and bring to the hospital.  Stop Metformin 2 days prior to surgery. Last day to take is Sunday, June 16. Resume after surgery.  NOW!  Stop Anti-inflammatories (NSAIDS) such as MELOXICAM, Advil, Aleve, Ibuprofen, Motrin, Naproxen, Naprosyn and Aspirin based products such as Excedrin, [place] Powder, BC Powder. (May take Tylenol or Acetaminophen if needed.)  NOW!  Stop ANY OVER THE COUNTER supplements until after surgery. (May continue multivitamin.)  Wear comfortable clothing (specific to your surgery type) to the hospital.  If you are being discharged the day of surgery, you will not be allowed to drive home. You will need a responsible adult to drive you home and stay with you that night.   If you are taking public transportation, you will need to have a responsible adult with you. Please confirm with your physician that it is acceptable to use public transportation.   Please call [phone] if you have any questions about these instructions.

## 2018-01-02 NOTE — Pre-Procedure Instructions (Signed)
Clearance due to AAA Portion of note copy and pasted from Vein [facility] Vascular note in Epic 12/29/17:  3. Calculus of kidney Patient is cleared for Urology surgery  4. Gastroesophageal reflux disease, esophagitis presence not specified Continue antihypertensive medications as already ordered, these medications have been reviewed and there are no changes at this time.  Avoidence of caffeine and alcohol  Moderate elevation of the head of the bed   5. COPD, mild ([place]) Continue pulmonary medications and aerosols as already ordered, these medications have been reviewed and there are no changes at this time.    6. Essential hypertension Continue antihypertensive medications as already ordered, these medications have been reviewed and there are no changes at this time.     Hortencia Pilar, MD  12/29/2017 8:36 PM

## 2018-01-05 DIAGNOSIS — C678 Malignant neoplasm of overlapping sites of bladder: Secondary | ICD-10-CM

## 2018-01-06 MED ORDER — CEFAZOLIN SODIUM-DEXTROSE 1-4 GM/50ML-% IV SOLN
1.0000 g | INTRAVENOUS | Status: AC
  Administered 2018-01-07: 2 g via INTRAVENOUS

## 2018-01-07 ENCOUNTER — Ambulatory Visit
Admission: RE | Admit: 2018-01-07 | Discharge: 2018-01-07 | Disposition: A | Discharge status: Home / Self Care | Payer: BLUE CROSS/BLUE SHIELD | Source: Ambulatory Visit | Attending: Urology | Admitting: Urology

## 2018-01-07 ENCOUNTER — Encounter
Admission: RE | Disposition: A | Discharge status: Home / Self Care | Payer: Self-pay | Source: Ambulatory Visit | Attending: Urology

## 2018-01-07 ENCOUNTER — Ambulatory Visit: Payer: BLUE CROSS/BLUE SHIELD | Admitting: Anesthesiology

## 2018-01-07 DIAGNOSIS — Z79899 Other long term (current) drug therapy: Secondary | ICD-10-CM | POA: Diagnosis not present

## 2018-01-07 DIAGNOSIS — K219 Gastro-esophageal reflux disease without esophagitis: Secondary | ICD-10-CM | POA: Diagnosis not present

## 2018-01-07 DIAGNOSIS — N132 Hydronephrosis with renal and ureteral calculous obstruction: Secondary | ICD-10-CM | POA: Insufficient documentation

## 2018-01-07 DIAGNOSIS — F17210 Nicotine dependence, cigarettes, uncomplicated: Secondary | ICD-10-CM | POA: Diagnosis not present

## 2018-01-07 DIAGNOSIS — I714 Abdominal aortic aneurysm, without rupture: Secondary | ICD-10-CM | POA: Diagnosis not present

## 2018-01-07 DIAGNOSIS — Z9889 Other specified postprocedural states: Secondary | ICD-10-CM | POA: Diagnosis not present

## 2018-01-07 DIAGNOSIS — Z791 Long term (current) use of non-steroidal anti-inflammatories (NSAID): Secondary | ICD-10-CM | POA: Insufficient documentation

## 2018-01-07 DIAGNOSIS — Z87442 Personal history of urinary calculi: Secondary | ICD-10-CM | POA: Insufficient documentation

## 2018-01-07 DIAGNOSIS — Z9852 Vasectomy status: Secondary | ICD-10-CM | POA: Diagnosis not present

## 2018-01-07 DIAGNOSIS — I10 Essential (primary) hypertension: Secondary | ICD-10-CM | POA: Diagnosis not present

## 2018-01-07 DIAGNOSIS — N200 Calculus of kidney: Secondary | ICD-10-CM | POA: Diagnosis not present

## 2018-01-07 DIAGNOSIS — J449 Chronic obstructive pulmonary disease, unspecified: Secondary | ICD-10-CM | POA: Insufficient documentation

## 2018-01-07 DIAGNOSIS — E785 Hyperlipidemia, unspecified: Secondary | ICD-10-CM | POA: Diagnosis not present

## 2018-01-07 DIAGNOSIS — N201 Calculus of ureter: Secondary | ICD-10-CM

## 2018-01-07 HISTORY — PX: CYSTOSCOPY/URETEROSCOPY/HOLMIUM LASER/STENT PLACEMENT: SHX6546

## 2018-01-07 LAB — GLUCOSE, CAPILLARY
Glucose-Capillary: 102 mg/dL — ABNORMAL HIGH (ref 65–99)
Glucose-Capillary: 105 mg/dL — ABNORMAL HIGH (ref 65–99)

## 2018-01-07 SURGERY — CYSTOSCOPY/URETEROSCOPY/HOLMIUM LASER/STENT PLACEMENT
Anesthesia: General | Laterality: Left | Wound class: Clean Contaminated

## 2018-01-07 MED ORDER — LIDOCAINE HCL (PF) 2 % IJ SOLN
INTRAMUSCULAR | Status: AC
  Filled 2018-01-07: qty 10

## 2018-01-07 MED ORDER — ONDANSETRON HCL 4 MG/2ML IJ SOLN
INTRAMUSCULAR | Status: DC | PRN
  Administered 2018-01-07: 4 mg via INTRAVENOUS

## 2018-01-07 MED ORDER — ROCURONIUM BROMIDE 100 MG/10ML IV SOLN
INTRAVENOUS | Status: DC | PRN
  Administered 2018-01-07: 40 mg via INTRAVENOUS

## 2018-01-07 MED ORDER — CEFAZOLIN SODIUM-DEXTROSE 1-4 GM/50ML-% IV SOLN
INTRAVENOUS | Status: AC
  Filled 2018-01-07: qty 50

## 2018-01-07 MED ORDER — LIDOCAINE 2% (20 MG/ML) 5 ML SYRINGE
INTRAMUSCULAR | Status: DC | PRN
  Administered 2018-01-07: 100 mg via INTRAVENOUS

## 2018-01-07 MED ORDER — DEXMEDETOMIDINE HCL 200 MCG/2ML IV SOLN
INTRAVENOUS | Status: DC | PRN
  Administered 2018-01-07: 12 ug via INTRAVENOUS

## 2018-01-07 MED ORDER — OXYCODONE HCL 5 MG/5ML PO SOLN: 5.0000 mg | Freq: Once | ORAL | Status: DC | PRN

## 2018-01-07 MED ORDER — ALBUTEROL SULFATE HFA 108 (90 BASE) MCG/ACT IN AERS
INHALATION_SPRAY | RESPIRATORY_TRACT | Status: DC | PRN
  Administered 2018-01-07: 6 via RESPIRATORY_TRACT

## 2018-01-07 MED ORDER — OXYCODONE HCL 5 MG PO TABS: 5.0000 mg | ORAL_TABLET | Freq: Once | ORAL | Status: DC | PRN

## 2018-01-07 MED ORDER — FENTANYL CITRATE (PF) 100 MCG/2ML IJ SOLN
INTRAMUSCULAR | Status: AC
  Filled 2018-01-07: qty 2

## 2018-01-07 MED ORDER — LACTATED RINGERS IV SOLN
INTRAVENOUS | Status: DC | PRN
  Administered 2018-01-07: 14:00:00 via INTRAVENOUS

## 2018-01-07 MED ORDER — FENTANYL CITRATE (PF) 100 MCG/2ML IJ SOLN: 25.0000 ug | INTRAMUSCULAR | Status: DC | PRN

## 2018-01-07 MED ORDER — PHENYLEPHRINE HCL 10 MG/ML IJ SOLN
INTRAMUSCULAR | Status: DC | PRN
  Administered 2018-01-07 (×2): 100 ug via INTRAVENOUS

## 2018-01-07 MED ORDER — SODIUM CHLORIDE 0.9 % IV SOLN
INTRAVENOUS | Status: DC
  Administered 2018-01-07: 13:00:00 via INTRAVENOUS

## 2018-01-07 MED ORDER — MIDAZOLAM HCL 2 MG/2ML IJ SOLN
INTRAMUSCULAR | Status: AC
  Filled 2018-01-07: qty 2

## 2018-01-07 MED ORDER — SUGAMMADEX SODIUM 200 MG/2ML IV SOLN
INTRAVENOUS | Status: DC | PRN
  Administered 2018-01-07: 205.4 mg via INTRAVENOUS

## 2018-01-07 MED ORDER — FENTANYL CITRATE (PF) 100 MCG/2ML IJ SOLN
INTRAMUSCULAR | Status: DC | PRN
  Administered 2018-01-07 (×2): 100 ug via INTRAVENOUS

## 2018-01-07 MED ORDER — DEXAMETHASONE SODIUM PHOSPHATE 10 MG/ML IJ SOLN
INTRAMUSCULAR | Status: AC
Start: 2018-01-07 — End: ?
  Filled 2018-01-07: qty 1

## 2018-01-07 MED ORDER — OXYBUTYNIN CHLORIDE 5 MG PO TABS: 5.0000 mg | ORAL_TABLET | Freq: Three times a day (TID) | ORAL | 0 refills | Status: DC | PRN

## 2018-01-07 MED ORDER — PROPOFOL 10 MG/ML IV BOLUS
INTRAVENOUS | Status: AC
  Filled 2018-01-07: qty 20

## 2018-01-07 MED ORDER — PROPOFOL 10 MG/ML IV BOLUS
INTRAVENOUS | Status: DC | PRN
  Administered 2018-01-07: 200 mg via INTRAVENOUS

## 2018-01-07 MED ORDER — LACTATED RINGERS IV BOLUS
500.0000 mL | Freq: Once | INTRAVENOUS | Status: AC
  Administered 2018-01-07: 500 mL via INTRAVENOUS

## 2018-01-07 MED ORDER — OXYCODONE-ACETAMINOPHEN 5-325 MG PO TABS: 1.0000 | ORAL_TABLET | ORAL | 0 refills | Status: DC | PRN

## 2018-01-07 MED ORDER — IOTHALAMATE MEGLUMINE 43 % IV SOLN
INTRAVENOUS | Status: DC | PRN
  Administered 2018-01-07: 15 mL

## 2018-01-07 MED ORDER — ONDANSETRON HCL 4 MG/2ML IJ SOLN
INTRAMUSCULAR | Status: AC
  Filled 2018-01-07: qty 2

## 2018-01-07 SURGICAL SUPPLY — 30 items
ADHESIVE MASTISOL STRL (MISCELLANEOUS) ×2 IMPLANT
BAG DRAIN CYSTO-URO LG1000N (MISCELLANEOUS) ×2 IMPLANT
BASKET ZERO TIP 1.9FR (BASKET) ×2 IMPLANT
BRUSH SCRUB EZ 1% IODOPHOR (MISCELLANEOUS) ×2 IMPLANT
CATH URETL 5X70 OPEN END (CATHETERS) ×2 IMPLANT
CNTNR SPEC 2.5X3XGRAD LEK (MISCELLANEOUS) ×1
CONRAY 43 FOR UROLOGY 50M (MISCELLANEOUS) ×2 IMPLANT
CONT SPEC 4OZ STER OR WHT (MISCELLANEOUS) ×1
CONTAINER SPEC 2.5X3XGRAD LEK (MISCELLANEOUS) ×1 IMPLANT
DRAPE UTILITY 15X26 TOWEL STRL (DRAPES) ×2 IMPLANT
DRSG TEGADERM 2-3/8X2-3/4 SM (GAUZE/BANDAGES/DRESSINGS) ×2 IMPLANT
FIBER LASER LITHO 273 (Laser) ×2 IMPLANT
GLOVE BIO SURGEON STRL SZ 6.5 (GLOVE) ×2 IMPLANT
GOWN STRL REUS W/ TWL LRG LVL3 (GOWN DISPOSABLE) ×2 IMPLANT
GOWN STRL REUS W/TWL LRG LVL3 (GOWN DISPOSABLE) ×2
GUIDEWIRE GREEN .038 145CM (MISCELLANEOUS) ×2 IMPLANT
INFUSOR MANOMETER BAG 3000ML (MISCELLANEOUS) ×2 IMPLANT
INTRODUCER DILATOR DOUBLE (INTRODUCER) ×2 IMPLANT
KIT TURNOVER CYSTO (KITS) ×2 IMPLANT
PACK CYSTO AR (MISCELLANEOUS) ×2 IMPLANT
SENSORWIRE 0.038 NOT ANGLED (WIRE) ×4
SET CYSTO W/LG BORE CLAMP LF (SET/KITS/TRAYS/PACK) ×2 IMPLANT
SHEATH URETERAL 12FR 45CM (SHEATH) ×2 IMPLANT
SOL .9 NS 3000ML IRR  AL (IV SOLUTION) ×1
SOL .9 NS 3000ML IRR UROMATIC (IV SOLUTION) ×1 IMPLANT
STENT URET 6FRX24 CONTOUR (STENTS) IMPLANT
STENT URET 6FRX26 CONTOUR (STENTS) ×2 IMPLANT
SURGILUBE 2OZ TUBE FLIPTOP (MISCELLANEOUS) ×2 IMPLANT
WATER STERILE IRR 1000ML POUR (IV SOLUTION) ×2 IMPLANT
WIRE SENSOR 0.038 NOT ANGLED (WIRE) ×2 IMPLANT

## 2018-01-07 NOTE — Op Note (Signed)
Date of procedure: 01/07/18  Preoperative diagnosis:  1. Left kidney stones  Postoperative diagnosis:  1. Same as above  Procedure: 1. Left ureteroscopy with laser lithotripsy 2. Left retrograde pyelogram 3. Left basket extraction of stone fragment 4. Left ureteral stent placement on tether  Surgeon: Hollice Espy, MD  Anesthesia: General  Complications: None  Intraoperative findings: Nonobstructing stones up to 8 mm treated within the left kidney.  EBL: Minimal  Specimens: Stone fragments  Drains: 6 x 26 French double-J ureteral stent on left with tether  Indication: [name] is a 57 y.o. patient with personal history of recurrent nephrolithiasis who presented with a 5 mm left distal ureteral calculus as well as additional nonobstructing left kidney stones measuring up to 8 mm in the left renal pelvis.  In the preoperative holding area, he passed the ureteral stone, 5 mm which was personally visualized.  Prior to proceeding with surgery, we discussed options including continued observation for nonobstructing stones versus proceeding as planned to treat his nonobstructing stones in the left versus alternative treatments including shockwave lithotripsy.  He elected to proceed with left ureteroscopy as previously discussed for nonobstructing stones.  Plan.  After reviewing the management options for treatment, he elected to proceed with the above surgical procedure(s). We have discussed the potential benefits and risks of the procedure, side effects of the proposed treatment, the likelihood of the patient achieving the goals of the procedure, and any potential problems that might occur during the procedure or recuperation. Informed consent has been obtained.  Description of procedure:  The patient was taken to the operating room and general anesthesia was induced.  The patient was placed in the dorsal lithotomy position, prepped and draped in the usual sterile fashion, and  preoperative antibiotics were administered. A preoperative time-out was performed.   A 21 French scope was advanced per urethra into the bladder.  Attention was turned to the left ureteral orifice which was cannulated using a 5 [place] open-ended ureteral catheter.  Gentle retrograde pyelogram was performed revealing a mildly dilated left ureter without filling defects with a small amount of tapering at the UPJ without significant hydronephrosis.  There was a filling defect in the renal pelvis consistent with known stone at this location.  A sensor wire was then placed up to level of the kidney.  A dual-lumen introducer was used to place a Super Stiff wire up to the kidney as well.  The sensor wire snapped in place as a safety wire.  A ureteral access sheath 8/10 was advanced over the Super Stiff wire up to level of the proximal ureter without any difficulty.  The inner lumen and Super Stiff wire were removed.  An 8 French dual-lumen Wolf ureteroscope, digital flexible was advanced through the access sheath into the renal pelvis.  The large 8 mm stone was encountered in the lower pole.  Due to angulation, this was not easily accessible.  As such of 1.9 French tipless nitinol basket was used to move the stone into an upper pole calyx.  A 273 m laser fiber was then brought in and using the settings of 0.2 J and 40 Hz, the stone was fragmented into very small pieces.  The nitinol basket was then used to extract each and every piece.  A few small nonobstructing stones were treated in additional calyces until the entire collecting system was clear.  A final retrograde pyelogram revealed no extravasation.  The scope was then backed down the length the ureter inspecting  along the way.  No additional stones or ureteral injuries were appreciated.  The safety wire was then backloaded over rigid cystoscope.  A 6 x 26 French double-J ureteral stent was advanced over the wire up to level of the renal pelvis.  The wires  partially drawn until focal stone within the renal pelvis.  The wires and fully withdrawn a full coils noted within the bladder.  The bladder was then drained.  The string was left affixed to the distal coil of the stent.  The stent string was secured to the patient's glans using Mastisol and Tegaderm.  Patient was then clean and dry, repositioned in supine position, reversed anesthesia, taken to the PACU in stable condition.  Plan: Patient will remove his own stent in 2 days.  He will follow-up in 4 weeks with renal ultrasound prior.  He may be interested in discussing management of his nonobstructing right-sided stones at that point.  Hollice Espy, M.D.

## 2018-01-07 NOTE — Anesthesia Preprocedure Evaluation (Addendum)
Anesthesia Evaluation  Patient identified by MRN, date of birth, ID band Patient awake    Reviewed: Allergy & Precautions, H&P , NPO status , Patient's Chart, lab work & pertinent test results  History of Anesthesia Complications Negative for: history of anesthetic complications  Airway Mallampati: II  TM Distance: <3 FB Neck ROM: full    Dental  (+) Chipped, Missing   Pulmonary neg shortness of breath, COPD,  COPD inhaler, Current Smoker,           Cardiovascular Exercise Tolerance: Good hypertension, (-) angina+ Peripheral Vascular Disease  (-) Past MI and (-) DOE      Neuro/Psych negative neurological ROS  negative psych ROS   GI/Hepatic Neg liver ROS, GERD  Medicated and Controlled,  Endo/Other  negative endocrine ROS  Renal/GU Renal disease     Musculoskeletal  (+) Arthritis ,   Abdominal   Peds  Hematology negative hematology ROS (+)   Anesthesia Other Findings Past Medical History: No date: AAA (abdominal aortic aneurysm) ([place]) 12/01/2013: Acid reflux No date: Arthritis 12/01/2013: Benign fibroma of prostate 03/14/2015: BP (high blood pressure) 03/14/2015: Calculus of kidney 03/25/2014: COPD, mild (HCC) No date: History of kidney stones 12/01/2013: HLD (hyperlipidemia) No date: Hypertension No date: Renal stones  Past Surgical History: No date: APPENDECTOMY No date: CARDIAC CATHETERIZATION 2004?: CORONARY ANGIOPLASTY     Comment:  [place], [place] 01/28/2015: CYSTOSCOPY W/ URETERAL STENT PLACEMENT; Left     Comment:  Procedure: CYSTOSCOPY WITH RETROGRADE PYELOGRAM/URETERAL              STENT PLACEMENT;  Surgeon: Alexis Frock, MD;  Location:              [facility] ORS;  Service: Urology;  Laterality: Left; 02/08/2015: CYSTOSCOPY/URETEROSCOPY/HOLMIUM LASER/STENT PLACEMENT; Left     Comment:  Procedure: CYSTOSCOPY/URETEROSCOPY/HOLMIUM LASER/STENT               PLACEMENT;  Surgeon: Hollice Espy, MD;  Location:  [facility]               ORS;  Service: Urology;  Laterality: Left; No date: FACIAL COSMETIC SURGERY     Comment:  21 fractures in left cheek and face from baseball No date: LITHOTRIPSY No date: VASECTOMY  BMI    Body Mass Index:  34.42 kg/m      Reproductive/Obstetrics negative OB ROS                             Anesthesia Physical Anesthesia Plan  ASA: III  Anesthesia Plan: General ETT   Post-op Pain Management:    Induction: Intravenous  PONV Risk Score and Plan: Ondansetron, Dexamethasone and Midazolam  Airway Management Planned: Oral ETT  Additional Equipment:   Intra-op Plan:   Post-operative Plan: Extubation in OR  Informed Consent: I have reviewed the patients History and Physical, chart, labs and discussed the procedure including the risks, benefits and alternatives for the proposed anesthesia with the patient or authorized representative who has indicated his/her understanding and acceptance.   Dental Advisory Given  Plan Discussed with: Anesthesiologist, CRNA and Surgeon  Anesthesia Plan Comments: (Patient consented for risks of anesthesia including but not limited to:  - adverse reactions to medications - damage to teeth, lips or other oral mucosa - sore throat or hoarseness - Damage to heart, brain, lungs or loss of life  Patient voiced understanding.)       Anesthesia Quick Evaluation

## 2018-01-07 NOTE — Anesthesia Procedure Notes (Signed)
Procedure Name: Intubation Date/Time: 01/07/2018 1:31 PM Performed by: Marsh Dolly, CRNA Pre-anesthesia Checklist: Patient identified, Patient being monitored, Timeout performed, Emergency Drugs available and Suction available Patient Re-evaluated:Patient Re-evaluated prior to induction Oxygen Delivery Method: Circle system utilized Preoxygenation: Pre-oxygenation with 100% oxygen Induction Type: IV induction Ventilation: Mask ventilation without difficulty Laryngoscope Size: 3 and Miller Grade View: Grade I Tube type: Oral Tube size: 7.5 mm Number of attempts: 1 Airway Equipment and Method: Stylet Placement Confirmation: ETT inserted through vocal cords under direct vision,  positive ETCO2 and breath sounds checked- equal and bilateral Secured at: 21 cm Tube secured with: Tape Dental Injury: Teeth and Oropharynx as per pre-operative assessment

## 2018-01-07 NOTE — Interval H&P Note (Signed)
History and Physical Interval Note:  01/07/2018 11:30 AM  [name]  has presented today for surgery, with the diagnosis of Left ureteral stones  The various methods of treatment have been discussed with the patient and family. After consideration of risks, benefits and other options for treatment, the patient has consented to  Procedure(s): CYSTOSCOPY/URETEROSCOPY/HOLMIUM LASER/STENT PLACEMENT (Left) as a surgical intervention .  The patient's history has been reviewed, patient examined, no change in status, stable for surgery.  I have reviewed the patient's chart and labs.  Questions were answered to the patient's satisfaction.    RRR CTAB  Hollice Espy

## 2018-01-07 NOTE — Discharge Instructions (Signed)
You have a ureteral stent in place.  This is a tube that extends from your kidney to your bladder.  This may cause urinary bleeding, burning with urination, and urinary frequency.  Please call our office or present to the ED if you develop fevers >101 or pain which is not able to be controlled with oral pain medications.  You may be given either Flomax and/ or ditropan to help with bladder spasms and stent pain in addition to pain medications.    You have a stent in place taped the head of your penis.  You may untape the stent, and removed Friday morning.  If you have any concerns or unable to do this on your own, please call our office.  We will be happy to [place] [address] [place], [place] [address] [phone]  AMBULATORY SURGERY  DISCHARGE INSTRUCTIONS   1) The drugs that you were given will stay in your system until tomorrow so for the next 24 hours you should not:  A) Drive an automobile B) Make any legal decisions C) Drink any alcoholic beverage   2) You may resume regular meals tomorrow.  Today it is better to start with liquids and gradually work up to solid foods.  You may eat anything you prefer, but it is better to start with liquids, then soup and crackers, and gradually work up to solid foods.   3) Please notify your doctor immediately if you have any unusual bleeding, trouble breathing, redness and pain at the surgery site, drainage, fever, or pain not relieved by medication.    4) Additional Instructions:        Please contact your physician with any problems or Same Day Surgery at [phone], Monday through Friday 6 am to 4 pm, or [place] [facility] number at [phone].

## 2018-01-07 NOTE — Transfer of Care (Signed)
Immediate Anesthesia Transfer of Care Note  Patient: [name]  Procedure(s) Performed: CYSTOSCOPY/URETEROSCOPY/HOLMIUM LASER/STENT PLACEMENT (Left )  Patient Location: PACU  Anesthesia Type:General  Level of Consciousness: awake, alert  and oriented  Airway & Oxygen Therapy: Patient Spontanous Breathing and Patient connected to face mask oxygen  Post-op Assessment: Report given to RN and Post -op Vital signs reviewed and stable  Post vital signs: Reviewed and stable  Last Vitals:  Vitals Value Taken Time  BP    Temp    Pulse    Resp    SpO2      Last Pain:  Vitals:   01/07/18 1118  TempSrc: Temporal  PainSc: 2          Complications: No apparent anesthesia complications

## 2018-01-07 NOTE — Anesthesia Post-op Follow-up Note (Signed)
Anesthesia QCDR form completed.        

## 2018-01-08 ENCOUNTER — Encounter: Payer: Self-pay | Admitting: Urology

## 2018-01-08 NOTE — Anesthesia Postprocedure Evaluation (Signed)
Anesthesia Post Note  Patient: [name]  Procedure(s) Performed: CYSTOSCOPY/URETEROSCOPY/HOLMIUM LASER/STENT PLACEMENT (Left )  Patient location during evaluation: PACU Anesthesia Type: General Level of consciousness: awake and alert Pain management: pain level controlled Vital Signs Assessment: post-procedure vital signs reviewed and stable Respiratory status: spontaneous breathing, nonlabored ventilation, respiratory function stable and patient connected to nasal cannula oxygen Cardiovascular status: blood pressure returned to baseline and stable Postop Assessment: no apparent nausea or vomiting Anesthetic complications: no     Last Vitals:  Vitals:   01/07/18 1454 01/07/18 1507  BP:  107/72  Pulse: 86 87  Resp: 17 16  Temp: 36.5 C (!) 36 C  SpO2: 94% 98%    Last Pain:  Vitals:   01/07/18 1507  TempSrc: Temporal  PainSc: 0-No pain                 Precious Haws Piscitello

## 2018-01-12 LAB — STONE ANALYSIS
Ca Oxalate,Dihydrate: 20 %
Ca Oxalate,Monohydr.: 65 %
Ca phos cry stone ql IR: 15 %
STONE WEIGHT KSTONE: 82.7 mg

## 2018-02-16 ENCOUNTER — Ambulatory Visit
Admission: RE | Admit: 2018-02-16 | Discharge: 2018-02-16 | Disposition: A | Discharge status: Home / Self Care | Payer: BLUE CROSS/BLUE SHIELD | Source: Ambulatory Visit | Attending: Urology | Admitting: Urology

## 2018-02-16 DIAGNOSIS — N281 Cyst of kidney, acquired: Secondary | ICD-10-CM | POA: Insufficient documentation

## 2018-02-16 DIAGNOSIS — N200 Calculus of kidney: Secondary | ICD-10-CM | POA: Insufficient documentation

## 2018-02-16 DIAGNOSIS — N201 Calculus of ureter: Secondary | ICD-10-CM | POA: Insufficient documentation

## 2018-02-19 ENCOUNTER — Ambulatory Visit (INDEPENDENT_AMBULATORY_CARE_PROVIDER_SITE_OTHER): Payer: BLUE CROSS/BLUE SHIELD | Admitting: Urology

## 2018-02-19 ENCOUNTER — Other Ambulatory Visit: Payer: Self-pay

## 2018-02-19 ENCOUNTER — Encounter: Payer: Self-pay | Admitting: Urology

## 2018-02-19 VITALS — BP 143/85 | HR 102 | Ht 67.0 in | Wt 233.4 lb

## 2018-02-19 DIAGNOSIS — N201 Calculus of ureter: Secondary | ICD-10-CM | POA: Diagnosis not present

## 2018-02-19 DIAGNOSIS — N200 Calculus of kidney: Secondary | ICD-10-CM

## 2018-02-19 NOTE — Progress Notes (Signed)
02/19/2018 5:16 PM   [name] 03-20-1961 [id number]  Referring provider: Sofie Hartigan, MD [place], [place] [address]  Chief Complaint  Patient presents with  . Left Ureteral Stone    HPI: 57 year old male with recurrent nephrolithiasis now status post left ureteroscopy, laser lithotripsy and stent on 01/07/18 who returns today for routine follow up.  Postoperatively, he is done exceptionally well and has no urinary complaints.  His stent was removed without difficulty.  No flank pain or gross hematuria.  Follow-up renal ultrasound on 02/16/2018 shows interval resolution of left-sided hydronephrosis.  He does have right-sided nonobstructing stone measuring 8 mm and small bilateral renal cyst.  Stone analysis consistent with 65% calcium oxalate monohydrate, 20% calcium oxalate dihydrate, 15% calcium phosphate.  He has had multiple stone episodes in the past.  He underwent ureteroscopy in 2016 on the left as well.  He is also had shockwave lithotripsy in the past.  He reports that he does not drink enough water.  He does drink dark sodas.  He is never out of 24-hour urine.  PMH: Past Medical History:  Diagnosis Date  . AAA (abdominal aortic aneurysm) ([place])   . Acid reflux 12/01/2013  . Arthritis   . 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 BP (high blood pressure) 03/14/2015  . Calculus of kidney 03/14/2015  . COPD, mild ([place]) 03/25/2014  . History of kidney stones   . HLD (hyperlipidemia) 12/01/2013  . Hypertension   . Renal stones     Surgical History: Past Surgical History:  Procedure Laterality Date  . APPENDECTOMY    . CARDIAC CATHETERIZATION    . CORONARY ANGIOPLASTY  2004?   [place], [place]  . CYSTOSCOPY W/ URETERAL STENT PLACEMENT Left 01/28/2015   Procedure: CYSTOSCOPY WITH RETROGRADE PYELOGRAM/URETERAL STENT PLACEMENT;  Surgeon: Alexis Frock, MD;  Location: [facility] ORS;  Service: Urology;  Laterality: Left;  . CYSTOSCOPY/URETEROSCOPY/HOLMIUM  LASER/STENT PLACEMENT Left 02/08/2015   Procedure: CYSTOSCOPY/URETEROSCOPY/HOLMIUM LASER/STENT PLACEMENT;  Surgeon: Hollice Espy, MD;  Location: [facility] ORS;  Service: Urology;  Laterality: Left;  . CYSTOSCOPY/URETEROSCOPY/HOLMIUM LASER/STENT PLACEMENT Left 01/07/2018   Procedure: CYSTOSCOPY/URETEROSCOPY/HOLMIUM LASER/STENT PLACEMENT;  Surgeon: Hollice Espy, MD;  Location: [facility] ORS;  Service: Urology;  Laterality: Left;  . FACIAL COSMETIC SURGERY     21 fractures in left cheek and face from baseball  . LITHOTRIPSY    . VASECTOMY      Home Medications:  Allergies as of 02/19/2018   No Known Allergies     Medication List        Accurate as of 02/19/18  5:16 PM. Always use your most recent med list.          atorvastatin 10 MG tablet Commonly known as:  LIPITOR Take 10 mg by mouth every morning.   atorvastatin 40 MG tablet Commonly known as:  LIPITOR   COMBIVENT RESPIMAT 20-100 MCG/ACT Aers respimat Generic drug:  Ipratropium-Albuterol Inhale 1 puff into the lungs 4 (four) times daily as needed for wheezing.   esomeprazole 20 MG capsule Commonly known as:  NEXIUM Take 20 mg by mouth every morning.   fenofibrate 145 MG tablet Commonly known as:  TRICOR Take 145 mg by mouth every morning.   lisinopril 10 MG tablet Commonly known as:  PRINIVIL,ZESTRIL Take 10 mg by mouth every morning.   meloxicam 15 MG tablet Commonly known as:  MOBIC Take 15 mg by mouth every morning.   metFORMIN 500 MG tablet Commonly known as:  GLUCOPHAGE Take 500  mg by mouth 2 (two) times daily with a meal.   MULTI-VITAMINS Tabs Take 1 tablet by mouth every morning.   ondansetron 4 MG disintegrating tablet Commonly known as:  ZOFRAN ODT Take 1 tablet (4 mg total) by mouth every 8 (eight) hours as needed for nausea or vomiting.       Allergies: No Known Allergies  Family History: Family History  Problem Relation Age of Onset  . Diabetes Mother   . Diabetes Maternal Uncle   . Cancer  Maternal Uncle   . Cancer Maternal Uncle   . Prostate cancer Neg Hx   . Bladder Cancer Neg Hx   . Kidney cancer Neg Hx     Social History:  reports that he has been smoking cigarettes.  He has a 300.00 pack-year smoking history. He has never used smokeless tobacco. He reports that he does not drink alcohol or use drugs.  ROS: UROLOGY Frequent Urination?: Yes Hard to postpone urination?: No Burning/pain with urination?: No Get up at night to urinate?: No Leakage of urine?: No Urine stream starts and stops?: Yes Trouble starting stream?: Yes Do you have to strain to urinate?: No Blood in urine?: No Urinary tract infection?: No Sexually transmitted disease?: No Injury to kidneys or bladder?: No Painful intercourse?: No Weak stream?: Yes Erection problems?: No Penile pain?: No  Gastrointestinal Nausea?: No Vomiting?: No Indigestion/heartburn?: Yes Diarrhea?: No Constipation?: No  Constitutional Fever: No Night sweats?: No Weight loss?: No Fatigue?: No  Skin Skin rash/lesions?: No Itching?: No  Eyes Blurred vision?: No Double vision?: No  Ears/Nose/Throat Sore throat?: No Sinus problems?: Yes  Hematologic/Lymphatic Swollen glands?: No Easy bruising?: No  Cardiovascular Leg swelling?: No Chest pain?: No  Respiratory Cough?: No Shortness of breath?: No  Endocrine Excessive thirst?: No  Musculoskeletal Back pain?: Yes Joint pain?: No  Neurological Headaches?: No Dizziness?: No  Psychologic Depression?: No Anxiety?: No  Physical Exam: BP (!) 143/85   Pulse (!) 102   Ht 5\' 7"  (1.702 m)   Wt 233 lb 6.4 oz (105.9 kg)   BMI 36.56 kg/m   Constitutional:  Alert and oriented, No acute distress. HEENT: [place] AT, moist mucus membranes.  Trachea midline, no masses. Cardiovascular: No clubbing, cyanosis, or edema. Respiratory: Normal respiratory effort, no increased work of breathing. Skin: No rashes, bruises or suspicious lesions. Neurologic:  Grossly intact, no focal deficits, moving all 4 extremities. Psychiatric: Normal mood and affect.  Laboratory Data: Lab Results  Component Value Date   WBC 12.3 (H) 12/23/2017   HGB 15.2 12/23/2017   HCT 45.2 12/23/2017   MCV 94.6 12/23/2017   PLT 295 12/23/2017    Lab Results  Component Value Date   CREATININE 1.29 (H) 12/23/2017   Urinalysis N/a  Pertinent Imaging: Results for orders placed during the hospital encounter of 02/16/18  US RENAL   Narrative CLINICAL DATA:  57 year old male with obstructing left renal stone June 2019. Asymptomatic today.  EXAM: RENAL / URINARY TRACT ULTRASOUND COMPLETE  COMPARISON:  12/24/2017 CT.  FINDINGS: Right Kidney:  Length: 14.4 cm. Echogenicity within normal limits. No hydronephrosis. Lower pole 12 mm nonobstructing stone. 1.2 x 1 x 0.9 cm cyst mid aspect.  Left Kidney:  Length: 15.3 cm. Echogenicity within normal limits. No hydronephrosis. Tiny nonobstructing renal calculi. 0.9 x 0.8 x 0.6 cm cyst mid aspect  Bladder:  Appears normal for degree of bladder distention. Bilateral ureteral jets noted.  IMPRESSION: Interval resolution of left-sided hydronephrosis.  Bilateral nonobstructing renal calculi. Bilateral small  renal cysts.   Electronically Signed   By: Genia Del M.D.   On: 02/16/2018 20:34      Assessment & Plan:    1. Left ureteral stone S/p uncomplicated ureteroscopy Follow-up renal ultrasound without occult hydronephrosis No significant left-sided stone burden Stone analysis reviewed with the patient  2. Right nephrolithiasis 8 mm asymptomatic nonobstructing kidney stone We discussed options including shockwave lithotripsy, ureteroscopy versus continued conservative management He reports he is never had a problem on the right side and prefer conservative management  3. Recurrent kidney stones We discussed general stone prevention techniques including drinking plenty water with goal of  producing 2.5 L urine daily, increased citric acid intake, avoidance of high oxalate containing foods, and decreased salt intake.  Information about dietary recommendations given today.   Given that he had multiple recurrent stones in bilateral, I strongly recommend 20-FEOF urine metabolic work-up.  He is interested in pursuing this.  I will have him follow-up with Larene Beach to discuss the results.   Return in about 3 months (around 05/22/2018) for [facility] with 24 hour urine.  Hollice Espy, MD  [facility] [address] [place], [place] [address] [phone]

## 2018-05-27 ENCOUNTER — Encounter: Payer: Self-pay | Admitting: Urology

## 2018-05-27 ENCOUNTER — Ambulatory Visit: Payer: BLUE CROSS/BLUE SHIELD | Admitting: Urology

## 2018-05-27 NOTE — Progress Notes (Deleted)
05/27/2018 5:38 AM   [name] May 28, 1961 [id number]  Referring provider: Sofie Hartigan, [place], [place] [address]  No chief complaint on file.   HPI: 57 year old male with recurrent nephrolithiasis now status post left ureteroscopy, laser lithotripsy and stent on 01/07/18 who returns today for routine follow up.  Postoperatively, he is done exceptionally well and has no urinary complaints.  His stent was removed without difficulty.  No flank pain or gross hematuria.  Follow-up renal ultrasound on 02/16/2018 shows interval resolution of left-sided hydronephrosis.  He does have right-sided nonobstructing stone measuring 8 mm and small bilateral renal cyst.  Stone analysis consistent with 65% calcium oxalate monohydrate, 20% calcium oxalate dihydrate, 15% calcium phosphate.  He has had multiple stone episodes in the past.  He underwent ureteroscopy in 2016 on the left as well.  He is also had shockwave lithotripsy in the past.  He reports that he does not drink enough water.  He does drink dark sodas.  He is never out of 24-hour urine.  PMH: Past Medical History:  Diagnosis Date  . AAA (abdominal aortic aneurysm) ([place])   . Acid reflux 12/01/2013  . Arthritis   . 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 BP (high blood pressure) 03/14/2015  . Calculus of kidney 03/14/2015  . COPD, mild ([place]) 03/25/2014  . History of kidney stones   . HLD (hyperlipidemia) 12/01/2013  . Hypertension   . Renal stones     Surgical History: Past Surgical History:  Procedure Laterality Date  . APPENDECTOMY    . CARDIAC CATHETERIZATION    . CORONARY ANGIOPLASTY  2004?   [place], [place]  . CYSTOSCOPY W/ URETERAL STENT PLACEMENT Left 01/28/2015   Procedure: CYSTOSCOPY WITH RETROGRADE PYELOGRAM/URETERAL STENT PLACEMENT;  Surgeon: Alexis Frock, MD;  Location: [facility] ORS;  Service: Urology;  Laterality: Left;  . CYSTOSCOPY/URETEROSCOPY/HOLMIUM LASER/STENT PLACEMENT Left 02/08/2015     Procedure: CYSTOSCOPY/URETEROSCOPY/HOLMIUM LASER/STENT PLACEMENT;  Surgeon: Hollice Espy, MD;  Location: [facility] ORS;  Service: Urology;  Laterality: Left;  . CYSTOSCOPY/URETEROSCOPY/HOLMIUM LASER/STENT PLACEMENT Left 01/07/2018   Procedure: CYSTOSCOPY/URETEROSCOPY/HOLMIUM LASER/STENT PLACEMENT;  Surgeon: Hollice Espy, MD;  Location: [facility] ORS;  Service: Urology;  Laterality: Left;  . FACIAL COSMETIC SURGERY     21 fractures in left cheek and face from baseball  . LITHOTRIPSY    . VASECTOMY      Home Medications:  Allergies as of 05/27/2018   No Known Allergies     Medication List        Accurate as of 05/27/18  5:38 AM. Always use your most recent med list.          atorvastatin 10 MG tablet Commonly known as:  LIPITOR Take 10 mg by mouth every morning.   atorvastatin 40 MG tablet Commonly known as:  LIPITOR   COMBIVENT RESPIMAT 20-100 MCG/ACT Aers respimat Generic drug:  Ipratropium-Albuterol Inhale 1 puff into the lungs 4 (four) times daily as needed for wheezing.   esomeprazole 20 MG capsule Commonly known as:  NEXIUM Take 20 mg by mouth every morning.   fenofibrate 145 MG tablet Commonly known as:  TRICOR Take 145 mg by mouth every morning.   lisinopril 10 MG tablet Commonly known as:  PRINIVIL,ZESTRIL Take 10 mg by mouth every morning.   meloxicam 15 MG tablet Commonly known as:  MOBIC Take 15 mg by mouth every morning.   metFORMIN 500 MG tablet Commonly known as:  GLUCOPHAGE Take 500 mg by mouth 2 (two) times  daily with a meal.   MULTI-VITAMINS Tabs Take 1 tablet by mouth every morning.   ondansetron 4 MG disintegrating tablet Commonly known as:  ZOFRAN-ODT Take 1 tablet (4 mg total) by mouth every 8 (eight) hours as needed for nausea or vomiting.       Allergies: No Known Allergies  Family History: Family History  Problem Relation Age of Onset  . Diabetes Mother   . Diabetes Maternal Uncle   . Cancer Maternal Uncle   . Cancer Maternal  Uncle   . Prostate cancer Neg Hx   . Bladder Cancer Neg Hx   . Kidney cancer Neg Hx     Social History:  reports that he has been smoking cigarettes. He has a 300.00 pack-year smoking history. He has never used smokeless tobacco. He reports that he does not drink alcohol or use drugs.  ROS:                                        Physical Exam: There were no vitals taken for this visit.  Constitutional:  Alert and oriented, No acute distress. HEENT: [place] AT, moist mucus membranes.  Trachea midline, no masses. Cardiovascular: No clubbing, cyanosis, or edema. Respiratory: Normal respiratory effort, no increased work of breathing. Skin: No rashes, bruises or suspicious lesions. Neurologic: Grossly intact, no focal deficits, moving all 4 extremities. Psychiatric: Normal mood and affect.  Laboratory Data: Lab Results  Component Value Date   WBC 12.3 (H) 12/23/2017   HGB 15.2 12/23/2017   HCT 45.2 12/23/2017   MCV 94.6 12/23/2017   PLT 295 12/23/2017    Lab Results  Component Value Date   CREATININE 1.29 (H) 12/23/2017   Urinalysis N/a  Pertinent Imaging: Results for orders placed during the hospital encounter of 02/16/18  US RENAL   Narrative CLINICAL DATA:  57 year old male with obstructing left renal stone June 2019. Asymptomatic today.  EXAM: RENAL / URINARY TRACT ULTRASOUND COMPLETE  COMPARISON:  12/24/2017 CT.  FINDINGS: Right Kidney:  Length: 14.4 cm. Echogenicity within normal limits. No hydronephrosis. Lower pole 12 mm nonobstructing stone. 1.2 x 1 x 0.9 cm cyst mid aspect.  Left Kidney:  Length: 15.3 cm. Echogenicity within normal limits. No hydronephrosis. Tiny nonobstructing renal calculi. 0.9 x 0.8 x 0.6 cm cyst mid aspect  Bladder:  Appears normal for degree of bladder distention. Bilateral ureteral jets noted.  IMPRESSION: Interval resolution of left-sided hydronephrosis.  Bilateral nonobstructing renal calculi.  Bilateral small renal cysts.   Electronically Signed   By: Genia Del M.D.   On: 02/16/2018 20:34      Assessment & Plan:    1. Left ureteral stone S/p uncomplicated ureteroscopy Follow-up renal ultrasound without occult hydronephrosis No significant left-sided stone burden Stone analysis reviewed with the patient  2. Right nephrolithiasis 8 mm asymptomatic nonobstructing kidney stone We discussed options including shockwave lithotripsy, ureteroscopy versus continued conservative management He reports he is never had a problem on the right side and prefer conservative management  3. Recurrent kidney stones We discussed general stone prevention techniques including drinking plenty water with goal of producing 2.5 L urine daily, increased citric acid intake, avoidance of high oxalate containing foods, and decreased salt intake.  Information about dietary recommendations given today.   Given that he had multiple recurrent stones in bilateral, I strongly recommend 69-GEXB urine metabolic work-up.  He is interested in pursuing this.  I will have him follow-up with Larene Beach to discuss the results.   No follow-ups on file.  Zara Council, PA-C  [facility] [address] [place], [place] [address] [phone]

## 2019-05-04 ENCOUNTER — Encounter: Payer: Self-pay | Admitting: Emergency Medicine

## 2019-05-04 ENCOUNTER — Emergency Department: Payer: BC Managed Care – PPO

## 2019-05-04 ENCOUNTER — Other Ambulatory Visit: Payer: Self-pay

## 2019-05-04 ENCOUNTER — Emergency Department
Admission: EM | Admit: 2019-05-04 | Discharge: 2019-05-05 | Disposition: A | Discharge status: Home / Self Care | Payer: BC Managed Care – PPO | Attending: Emergency Medicine | Admitting: Emergency Medicine

## 2019-05-04 DIAGNOSIS — Z79899 Other long term (current) drug therapy: Secondary | ICD-10-CM | POA: Insufficient documentation

## 2019-05-04 DIAGNOSIS — I10 Essential (primary) hypertension: Secondary | ICD-10-CM | POA: Diagnosis not present

## 2019-05-04 DIAGNOSIS — Z7984 Long term (current) use of oral hypoglycemic drugs: Secondary | ICD-10-CM | POA: Diagnosis not present

## 2019-05-04 DIAGNOSIS — J449 Chronic obstructive pulmonary disease, unspecified: Secondary | ICD-10-CM | POA: Insufficient documentation

## 2019-05-04 DIAGNOSIS — Z20828 Contact with and (suspected) exposure to other viral communicable diseases: Secondary | ICD-10-CM | POA: Insufficient documentation

## 2019-05-04 DIAGNOSIS — F17210 Nicotine dependence, cigarettes, uncomplicated: Secondary | ICD-10-CM | POA: Diagnosis not present

## 2019-05-04 DIAGNOSIS — R0602 Shortness of breath: Secondary | ICD-10-CM | POA: Diagnosis present

## 2019-05-04 DIAGNOSIS — Z791 Long term (current) use of non-steroidal anti-inflammatories (NSAID): Secondary | ICD-10-CM | POA: Insufficient documentation

## 2019-05-04 DIAGNOSIS — R079 Chest pain, unspecified: Secondary | ICD-10-CM

## 2019-05-04 LAB — COMPREHENSIVE METABOLIC PANEL
ALT: 32 U/L (ref 0–44)
AST: 23 U/L (ref 15–41)
Albumin: 3.9 g/dL (ref 3.5–5.0)
Alkaline Phosphatase: 34 U/L — ABNORMAL LOW (ref 38–126)
Anion gap: 10 (ref 5–15)
BUN: 22 mg/dL — ABNORMAL HIGH (ref 6–20)
CO2: 22 mmol/L (ref 22–32)
Calcium: 9.9 mg/dL (ref 8.9–10.3)
Chloride: 106 mmol/L (ref 98–111)
Creatinine, Ser: 0.96 mg/dL (ref 0.61–1.24)
GFR calc Af Amer: >60 mL/min (ref >60)
GFR calc non Af Amer: >60 mL/min (ref >60)
Glucose, Bld: 104 mg/dL — ABNORMAL HIGH (ref 70–99)
Potassium: 4.1 mmol/L (ref 3.5–5.1)
Sodium: 138 mmol/L (ref 135–145)
Total Bilirubin: 0.6 mg/dL (ref 0.3–1.2)
Total Protein: 6.9 g/dL (ref 6.5–8.1)

## 2019-05-04 LAB — CBC WITH DIFFERENTIAL/PLATELET
Abs Immature Granulocytes: 0.13 10*3/uL — ABNORMAL HIGH (ref 0.00–0.07)
Basophils Absolute: 0.1 10*3/uL (ref 0.0–0.1)
Basophils Relative: 1 %
Eosinophils Absolute: 0.6 10*3/uL — ABNORMAL HIGH (ref 0.0–0.5)
Eosinophils Relative: 4 %
HCT: 47.6 % (ref 39.0–52.0)
Hemoglobin: 16.3 g/dL (ref 13.0–17.0)
Immature Granulocytes: 1 %
Lymphocytes Relative: 34 %
Lymphs Abs: 5.1 10*3/uL — ABNORMAL HIGH (ref 0.7–4.0)
MCH: 31.8 pg (ref 26.0–34.0)
MCHC: 34.2 g/dL (ref 30.0–36.0)
MCV: 92.8 fL (ref 80.0–100.0)
Monocytes Absolute: 1.2 10*3/uL — ABNORMAL HIGH (ref 0.1–1.0)
Monocytes Relative: 8 %
Neutro Abs: 7.9 10*3/uL — ABNORMAL HIGH (ref 1.7–7.7)
Neutrophils Relative %: 52 %
Platelets: 332 10*3/uL (ref 150–400)
RBC: 5.13 MIL/uL (ref 4.22–5.81)
RDW: 13.2 % (ref 11.5–15.5)
WBC: 15 10*3/uL — ABNORMAL HIGH (ref 4.0–10.5)
nRBC: 0 % (ref 0.0–0.2)

## 2019-05-04 LAB — SARS CORONAVIRUS 2 BY RT PCR (HOSPITAL ORDER, PERFORMED IN ~~LOC~~ HOSPITAL LAB): SARS Coronavirus 2: NEGATIVE

## 2019-05-04 LAB — TROPONIN I (HIGH SENSITIVITY)
Troponin I (High Sensitivity): 4 ng/L (ref <18)
Troponin I (High Sensitivity): 4 ng/L (ref <18)

## 2019-05-04 MED ORDER — ASPIRIN 81 MG PO CHEW
324.0000 mg | CHEWABLE_TABLET | Freq: Once | ORAL | Status: AC
  Administered 2019-05-04: 22:00:00 324 mg via ORAL
  Filled 2019-05-04: qty 4

## 2019-05-04 MED ORDER — ASPIRIN EC 325 MG PO TBEC: 325.0000 mg | DELAYED_RELEASE_TABLET | Freq: Every day | ORAL | 0 refills | Status: DC

## 2019-05-04 NOTE — ED Triage Notes (Addendum)
Patient ambulatory to triage with steady gait, without difficulty or distress noted; pt reports since yesterday having nonprod cough, SHOB and HA; st hx COPD and +smoker

## 2019-05-04 NOTE — ED Provider Notes (Signed)
[facility] Emergency Department Provider Note  ____________________________________________  Time seen: Approximately 10:50 PM  I have reviewed the triage vital signs and the nursing notes.   HISTORY  Chief Complaint Shortness of Breath    HPI [name] is a 58 y.o. male with a history of AAA, hypertension hyperlipidemia COPD PAD diabetes who comes to the ED complaining of chest pain described as tightness, nonradiating, associated with shortness of breath, gradual onset yesterday.  Intermittent episodes lasting a few minutes at a time, worse with walking and stair climbing, better with rest.   Patient also endorses body aches and chills without known sick contacts.   Past Medical History:  Diagnosis Date  . AAA (abdominal aortic aneurysm) ([place])   . Acid reflux 12/01/2013  . Arthritis   . 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 BP (high blood pressure) 03/14/2015  . Calculus of kidney 03/14/2015  . COPD, mild ([place]) 03/25/2014  . History of kidney stones   . HLD (hyperlipidemia) 12/01/2013  . Hypertension   . Renal stones      Patient Active Problem List   Diagnosis Date Noted  . AAA (abdominal aortic aneurysm) without rupture ([place]) 05/13/2016  . PVD (peripheral vascular disease) ([place]) 04/10/2016  . BP (high blood pressure) 03/14/2015  . Calculus of kidney 03/14/2015  . COPD, mild ([place]) 03/25/2014  . 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 Acid reflux 12/01/2013  . HLD (hyperlipidemia) 12/01/2013     Past Surgical History:  Procedure Laterality Date  . APPENDECTOMY    . CARDIAC CATHETERIZATION    . CORONARY ANGIOPLASTY  2004?   [place], [place]  . CYSTOSCOPY W/ URETERAL STENT PLACEMENT Left 01/28/2015   Procedure: CYSTOSCOPY WITH RETROGRADE PYELOGRAM/URETERAL STENT PLACEMENT;  Surgeon: Alexis Frock, MD;  Location: [facility] ORS;  Service: Urology;  Laterality: Left;  . CYSTOSCOPY/URETEROSCOPY/HOLMIUM LASER/STENT PLACEMENT Left 02/08/2015   Procedure: CYSTOSCOPY/URETEROSCOPY/HOLMIUM LASER/STENT PLACEMENT;  Surgeon: Hollice Espy, MD;  Location: [facility] ORS;  Service: Urology;  Laterality: Left;  . CYSTOSCOPY/URETEROSCOPY/HOLMIUM LASER/STENT PLACEMENT Left 01/07/2018   Procedure: CYSTOSCOPY/URETEROSCOPY/HOLMIUM LASER/STENT PLACEMENT;  Surgeon: Hollice Espy, MD;  Location: [facility] ORS;  Service: Urology;  Laterality: Left;  . FACIAL COSMETIC SURGERY     21 fractures in left cheek and face from baseball  . LITHOTRIPSY    . VASECTOMY       Prior to Admission medications   Medication Sig Start Date End Date Taking? Authorizing Provider  aspirin EC 325 MG tablet Take 1 tablet (325 mg total) by mouth daily. 05/04/19   Carrie Mew, MD  atorvastatin (LIPITOR) 10 MG tablet Take 10 mg by mouth every morning.  01/25/15   [provider]  atorvastatin (LIPITOR) 40 MG tablet  02/03/18   [provider]  esomeprazole (NEXIUM) 20 MG capsule Take 20 mg by mouth every morning.    [provider]  fenofibrate (TRICOR) 145 MG tablet Take 145 mg by mouth every morning.  01/17/15   [provider]  Ipratropium-Albuterol (COMBIVENT RESPIMAT) 20-100 MCG/ACT AERS respimat Inhale 1 puff into the lungs 4 (four) times daily as needed for wheezing.  03/25/14   [provider]  lisinopril (PRINIVIL,ZESTRIL) 10 MG tablet Take 10 mg by mouth every morning.     [provider]  meloxicam (MOBIC) 15 MG tablet Take 15 mg by mouth every morning.  12/05/17   [provider]  metFORMIN (GLUCOPHAGE) 500 MG tablet Take 500 mg by mouth 2 (two) times daily with a meal. 12/03/17  [provider]  Multiple Vitamin (MULTI-VITAMINS) TABS Take 1 tablet by mouth every morning.     [provider]  ondansetron (ZOFRAN ODT) 4 MG disintegrating tablet Take 1 tablet (4 mg total) by mouth every 8 (eight) hours as needed for nausea or vomiting. 12/24/17   Gregor Hams, MD     Allergies Patient has no  known allergies.   Family History  Problem Relation Age of Onset  . Diabetes Mother   . Diabetes Maternal Uncle   . Cancer Maternal Uncle   . Cancer Maternal Uncle   . Prostate cancer Neg Hx   . Bladder Cancer Neg Hx   . Kidney cancer Neg Hx     Social History Social History   Tobacco Use  . Smoking status: Current Every Day Smoker    Packs/day: 10.00    Years: 30.00    Pack years: 300.00    Types: Cigarettes  . Smokeless tobacco: Never Used  Substance Use Topics  . Alcohol use: No    Alcohol/week: 0.0 standard drinks  . Drug use: No    Review of Systems  Constitutional:   No fever or chills.  ENT:   No sore throat. No rhinorrhea. Cardiovascular:   Positive chest pain as above without syncope. Respiratory:   Positive shortness of breath without cough. Gastrointestinal:   Negative for abdominal pain, vomiting and diarrhea.  Musculoskeletal:   Negative for focal pain or swelling All other systems reviewed and are negative except as documented above in ROS and HPI.  ____________________________________________   PHYSICAL EXAM:  VITAL SIGNS: ED Triage Vitals  Enc Vitals Group     BP 05/04/19 2003 136/71     Pulse Rate 05/04/19 2003 (!) 107     Resp 05/04/19 2003 20     Temp 05/04/19 2003 98.1 F (36.7 C)     Temp Source 05/04/19 2003 Oral     SpO2 05/04/19 2003 97 %     Weight 05/04/19 2001 222 lb (100.7 kg)     Height 05/04/19 2001 5\' 9"  (1.753 m)     Head Circumference --      Peak Flow --      Pain Score 05/04/19 2000 5     Pain Loc --      Pain Edu? --      Excl. in [place]? --     Vital signs reviewed, nursing assessments reviewed.   Constitutional:   Alert and oriented. Non-toxic appearance. Eyes:   Conjunctivae are normal. EOMI. PERRL. ENT      Head:   Normocephalic and atraumatic.      Nose:   Wearing a mask.      Mouth/Throat:   Wearing a mask.      Neck:   No meningismus. Full ROM. Hematological/Lymphatic/Immunilogical:   No cervical  lymphadenopathy. Cardiovascular:   RRR. Symmetric bilateral radial and DP pulses.  No murmurs. Cap refill less than 2 seconds. Respiratory:   Normal respiratory effort without tachypnea/retractions. Breath sounds are clear and equal bilaterally. No wheezes/rales/rhonchi. Gastrointestinal:   Soft and nontender. Non distended. There is no CVA tenderness.  No rebound, rigidity, or guarding.  Musculoskeletal:   Normal range of motion in all extremities. No joint effusions.  No lower extremity tenderness.  No edema.  Chest wall nontender.  Neurologic:   Normal speech and language.  Motor grossly intact. No acute focal neurologic deficits are appreciated.  Skin:    Skin is warm, dry and intact. No rash  noted.  No petechiae, purpura, or bullae.  ____________________________________________    LABS (pertinent positives/negatives) (all labs ordered are listed, but only abnormal results are displayed) Labs Reviewed  CBC WITH DIFFERENTIAL/PLATELET - Abnormal; Notable for the following components:      Result Value   WBC 15.0 (*)    Neutro Abs 7.9 (*)    Lymphs Abs 5.1 (*)    Monocytes Absolute 1.2 (*)    Eosinophils Absolute 0.6 (*)    Abs Immature Granulocytes 0.13 (*)    All other components within normal limits  COMPREHENSIVE METABOLIC PANEL - Abnormal; Notable for the following components:   Glucose, Bld 104 (*)    BUN 22 (*)    Alkaline Phosphatase 34 (*)    All other components within normal limits  SARS CORONAVIRUS 2 BY RT PCR (HOSPITAL ORDER, [facility])  TROPONIN I (HIGH SENSITIVITY)  TROPONIN I (HIGH SENSITIVITY)   ____________________________________________   EKG  Interpreted by me Sinus tachycardia rate 107, normal axis intervals QRS ST segments and T waves.  ____________________________________________    [id number]  Dg Chest 2 View  Result Date: 05/04/2019 CLINICAL DATA:  Nonproductive cough EXAM: CHEST - 2 VIEW COMPARISON:   09/25/2012 FINDINGS: Linear scarring or atelectasis at the left base. No consolidation or effusion. Stable cardiomediastinal silhouette. No pneumothorax. IMPRESSION: No active cardiopulmonary disease. Stable scarring or atelectasis at the left base. Electronically Signed   By: Donavan Foil M.D.   On: 05/04/2019 20:26    ____________________________________________   PROCEDURES Procedures  ____________________________________________  DIFFERENTIAL DIAGNOSIS   Angina pectoris, non-STEMI.  Doubt unstable angina, dissection, PE, pericarditis, pneumonia, pneumothorax, sepsis  CLINICAL IMPRESSION / ASSESSMENT AND PLAN / ED COURSE  Medications ordered in the ED: Medications  aspirin chewable tablet 324 mg (324 mg Oral Given 05/04/19 2221)    Pertinent labs & imaging results that were available during my care of the patient were reviewed by me and considered in my medical decision making (see chart for details).  [name] was evaluated in Emergency Department on 05/04/2019 for the symptoms described in the history of present illness. He was evaluated in the context of the global COVID-19 pandemic, which necessitated consideration that the patient might be at risk for infection with the SARS-CoV-2 virus that causes COVID-19. Institutional protocols and algorithms that pertain to the evaluation of patients at risk for COVID-19 are in a state of rapid change based on information released by regulatory bodies including the CDC and federal and state organizations. These policies and algorithms were followed during the patient's care in the ED.   Patient presents with exertional chest pain or shortness of breath.  He has other constitutional symptoms raising suspicion for viral syndrome or Covid versus non-STEMI or stable angina.  Recommended hospitalization but patient refuses and prefers to follow-up with his wife's cardiologist, Dr. Ubaldo Glassing.  Given his symptoms I think this is not my  recommendation but is also not unreasonable given that we have high-sensitivity troponins and his initial one was normal.  He has medical decision-making capacity.    I will plan to repeat the troponin, wait for Covid result after which he can be discharged home [place] to follow-up with cardiology with return precautions.      ____________________________________________   FINAL CLINICAL IMPRESSION(S) / ED DIAGNOSES    Final diagnoses:  Chest pain, unspecified type     ED Discharge Orders         Ordered  aspirin EC 325 MG tablet  Daily     05/04/19 2250          Portions of this note were generated with dragon dictation software. Dictation errors may occur despite best attempts at proofreading.   Carrie Mew, MD 05/04/19 [phone]

## 2019-05-04 NOTE — Discharge Instructions (Signed)
Your lab tests today were normal and do not show any signs of recurrent heart attack.  Your symptoms are concerning for worsening heart disease so should follow-up with cardiology as soon as possible.  Return to the emergency department if your symptoms become worse or unremitting or if you have new concerns.  Your Covid test today was negative.

## 2019-05-04 NOTE — ED Notes (Signed)
MD at bedside. 

## 2019-08-27 ENCOUNTER — Encounter: Payer: Self-pay | Admitting: Emergency Medicine

## 2019-08-27 ENCOUNTER — Other Ambulatory Visit: Payer: Self-pay

## 2019-08-27 ENCOUNTER — Emergency Department
Admission: EM | Admit: 2019-08-27 | Discharge: 2019-08-27 | Disposition: A | Discharge status: Home / Self Care | Payer: BC Managed Care – PPO | Attending: Emergency Medicine | Admitting: Emergency Medicine

## 2019-08-27 ENCOUNTER — Emergency Department: Payer: BC Managed Care – PPO

## 2019-08-27 DIAGNOSIS — I10 Essential (primary) hypertension: Secondary | ICD-10-CM | POA: Insufficient documentation

## 2019-08-27 DIAGNOSIS — F17210 Nicotine dependence, cigarettes, uncomplicated: Secondary | ICD-10-CM | POA: Insufficient documentation

## 2019-08-27 DIAGNOSIS — Z20822 Contact with and (suspected) exposure to covid-19: Secondary | ICD-10-CM

## 2019-08-27 DIAGNOSIS — Z7982 Long term (current) use of aspirin: Secondary | ICD-10-CM | POA: Diagnosis not present

## 2019-08-27 DIAGNOSIS — U071 COVID-19: Secondary | ICD-10-CM | POA: Insufficient documentation

## 2019-08-27 DIAGNOSIS — R05 Cough: Secondary | ICD-10-CM | POA: Diagnosis present

## 2019-08-27 DIAGNOSIS — Z7984 Long term (current) use of oral hypoglycemic drugs: Secondary | ICD-10-CM | POA: Insufficient documentation

## 2019-08-27 DIAGNOSIS — Z79899 Other long term (current) drug therapy: Secondary | ICD-10-CM | POA: Insufficient documentation

## 2019-08-27 DIAGNOSIS — J449 Chronic obstructive pulmonary disease, unspecified: Secondary | ICD-10-CM | POA: Diagnosis not present

## 2019-08-27 MED ORDER — AZITHROMYCIN 250 MG PO TABS: ORAL_TABLET | ORAL | 0 refills | Status: DC

## 2019-08-27 MED ORDER — ALBUTEROL SULFATE HFA 108 (90 BASE) MCG/ACT IN AERS: 2.0000 | INHALATION_SPRAY | Freq: Four times a day (QID) | RESPIRATORY_TRACT | 2 refills | Status: DC | PRN

## 2019-08-27 MED ORDER — BENZONATATE 200 MG PO CAPS: 200.0000 mg | ORAL_CAPSULE | Freq: Three times a day (TID) | ORAL | 0 refills | Status: DC | PRN

## 2019-08-27 NOTE — ED Provider Notes (Signed)
[facility] Emergency Department Provider Note  ____________________________________________   First MD Initiated Contact with Patient 08/27/19 1706     (approximate)  I have reviewed the triage vital signs and the nursing notes.   HISTORY  Chief Complaint Generalized Body Aches and Cough    HPI [name] is a 59 y.o. male presents emergency department with Covid-like symptoms.  Patient states he has had low-grade fever/chills, headache, sore throat, and cough.  Symptoms for 1 week.  He denies chest pain or shortness of breath at this time.  He states he is just feeling very sore.  He denies any nausea/vomiting.    Past Medical History:  Diagnosis Date  . AAA (abdominal aortic aneurysm) ([place])   . Acid reflux 12/01/2013  . Arthritis   . 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 BP (high blood pressure) 03/14/2015  . Calculus of kidney 03/14/2015  . COPD, mild ([place]) 03/25/2014  . History of kidney stones   . HLD (hyperlipidemia) 12/01/2013  . Hypertension   . Renal stones     Patient Active Problem List   Diagnosis Date Noted  . AAA (abdominal aortic aneurysm) without rupture ([place]) 05/13/2016  . PVD (peripheral vascular disease) ([place]) 04/10/2016  . BP (high blood pressure) 03/14/2015  . Calculus of kidney 03/14/2015  . COPD, mild ([place]) 03/25/2014  . 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 Acid reflux 12/01/2013  . HLD (hyperlipidemia) 12/01/2013    Past Surgical History:  Procedure Laterality Date  . APPENDECTOMY    . CARDIAC CATHETERIZATION    . CORONARY ANGIOPLASTY  2004?   [place], [place]  . CYSTOSCOPY W/ URETERAL STENT PLACEMENT Left 01/28/2015   Procedure: CYSTOSCOPY WITH RETROGRADE PYELOGRAM/URETERAL STENT PLACEMENT;  Surgeon: Alexis Frock, MD;  Location: [facility] ORS;  Service: Urology;  Laterality: Left;  . CYSTOSCOPY/URETEROSCOPY/HOLMIUM LASER/STENT PLACEMENT Left 02/08/2015   Procedure: CYSTOSCOPY/URETEROSCOPY/HOLMIUM LASER/STENT  PLACEMENT;  Surgeon: Hollice Espy, MD;  Location: [facility] ORS;  Service: Urology;  Laterality: Left;  . CYSTOSCOPY/URETEROSCOPY/HOLMIUM LASER/STENT PLACEMENT Left 01/07/2018   Procedure: CYSTOSCOPY/URETEROSCOPY/HOLMIUM LASER/STENT PLACEMENT;  Surgeon: Hollice Espy, MD;  Location: [facility] ORS;  Service: Urology;  Laterality: Left;  . FACIAL COSMETIC SURGERY     21 fractures in left cheek and face from baseball  . LITHOTRIPSY    . VASECTOMY      Prior to Admission medications   Medication Sig Start Date End Date Taking? Authorizing Provider  albuterol (VENTOLIN HFA) 108 (90 Base) MCG/ACT inhaler Inhale 2 puffs into the lungs every 6 (six) hours as needed for wheezing or shortness of breath. 08/27/19   Caryn Section Linden Dolin, PA-C  aspirin EC 325 MG tablet Take 1 tablet (325 mg total) by mouth daily. 05/04/19   Carrie Mew, MD  atorvastatin (LIPITOR) 10 MG tablet Take 10 mg by mouth every morning.  01/25/15   [provider]  atorvastatin (LIPITOR) 40 MG tablet  02/03/18   [provider]  azithromycin (ZITHROMAX Z-PAK) 250 MG tablet 2 pills today then 1 pill a day for 4 days 08/27/19   Caryn Section Linden Dolin, PA-C  benzonatate (TESSALON) 200 MG capsule Take 1 capsule (200 mg total) by mouth 3 (three) times daily as needed for cough. 08/27/19   Caryn Section Linden Dolin, PA-C  esomeprazole (NEXIUM) 20 MG capsule Take 20 mg by mouth every morning.    [provider]  fenofibrate (TRICOR) 145 MG tablet Take 145 mg by mouth every morning.  01/17/15   [provider]  Ipratropium-Albuterol (  COMBIVENT RESPIMAT) 20-100 MCG/ACT AERS respimat Inhale 1 puff into the lungs 4 (four) times daily as needed for wheezing.  03/25/14   [provider]  lisinopril (PRINIVIL,ZESTRIL) 10 MG tablet Take 10 mg by mouth every morning.     [provider]  meloxicam (MOBIC) 15 MG tablet Take 15 mg by mouth every morning.  12/05/17   [provider]  metFORMIN (GLUCOPHAGE) 500 MG tablet Take  500 mg by mouth 2 (two) times daily with a meal. 12/03/17   [provider]  Multiple Vitamin (MULTI-VITAMINS) TABS Take 1 tablet by mouth every morning.     [provider]  ondansetron (ZOFRAN ODT) 4 MG disintegrating tablet Take 1 tablet (4 mg total) by mouth every 8 (eight) hours as needed for nausea or vomiting. 12/24/17   Gregor Hams, MD    Allergies Patient has no known allergies.  Family History  Problem Relation Age of Onset  . Diabetes Mother   . Diabetes Maternal Uncle   . Cancer Maternal Uncle   . Cancer Maternal Uncle   . Prostate cancer Neg Hx   . Bladder Cancer Neg Hx   . Kidney cancer Neg Hx     Social History Social History   Tobacco Use  . Smoking status: Current Every Day Smoker    Packs/day: 10.00    Years: 30.00    Pack years: 300.00    Types: Cigarettes  . Smokeless tobacco: Never Used  Substance Use Topics  . Alcohol use: No    Alcohol/week: 0.0 standard drinks  . Drug use: No    Review of Systems  Constitutional: No fever/chills, positive for body aches Eyes: No visual changes. ENT: Positive sore throat. Respiratory: Positive cough Cardiovascular: Denies chest pain Gastrointestinal: Denies abdominal pain Genitourinary: Negative for dysuria. Musculoskeletal: Negative for back pain. Skin: Negative for rash. Psychiatric: no mood changes,     ____________________________________________   PHYSICAL EXAM:  VITAL SIGNS: ED Triage Vitals [08/27/19 1652]  Enc Vitals Group     BP 124/78     Pulse Rate (!) 114     Resp 18     Temp 99.4 F (37.4 C)     Temp Source Oral     SpO2 96 %     Weight      Height      Head Circumference      Peak Flow      Pain Score      Pain Loc      Pain Edu?      Excl. in [place]?     Constitutional: Alert and oriented. Well appearing and in no acute distress. Eyes: Conjunctivae are normal.  Head: Atraumatic. Nose: No congestion/rhinnorhea. Mouth/Throat: Mucous membranes are moist.    Neck:  supple no lymphadenopathy noted Cardiovascular: Normal rate, regular rhythm. Heart sounds are normal Respiratory: Normal respiratory effort.  No retractions, lungs c t a, harsh cough noted GU: deferred Musculoskeletal: FROM all extremities, warm and well perfused Neurologic:  Normal speech and language.  Skin:  Skin is warm, dry and intact. No rash noted. Psychiatric: Mood and affect are normal. Speech and behavior are normal.  ____________________________________________   LABS (all labs ordered are listed, but only abnormal results are displayed)  Labs Reviewed  SARS CORONAVIRUS 2 (TAT 6-24 HRS)   ____________________________________________   ____________________________________________  RADIOLOGY  Chest x-ray shows some atelectasis in the lower lung fields  ____________________________________________   PROCEDURES  Procedure(s) performed: No  Procedures  ____________________________________________   INITIAL IMPRESSION / ASSESSMENT AND PLAN / ED COURSE  Pertinent labs & imaging results that were available during my care of the patient were reviewed by me and considered in my medical decision making (see chart for details).   Patient is a 59 year old male presents emergency department Covid-like symptoms.  Physical exam shows patient to appear stable.  Vitals are basically normal except his pulse is elevated.  Lungs are clear to auscultation.  Chest x-ray Covid test  Chest x-ray appears to have some atelectasis.  No Covid opacities are noted. Ambulatory pulse ox is normal.  Patient remains above 94%  I explained all the findings to the patient.  He was given strict instructions to return if worsening.  He was given a prescription for Z-Pak, albuterol inhaler, and Tessalon Per[place].  Due to his diabetic status I do not want to put him on a steroid at this time.  He states he understands all the instructions.  He was discharged in stable condition.  He  was given a work note explaining that he needs to quarantine.   DEZJUAN COPUS was evaluated in Emergency Department on 08/27/2019 for the symptoms described in the history of present illness. He was evaluated in the context of the global COVID-19 pandemic, which necessitated consideration that the patient might be at risk for infection with the SARS-CoV-2 virus that causes COVID-19. Institutional protocols and algorithms that pertain to the evaluation of patients at risk for COVID-19 are in a state of rapid change based on information released by regulatory bodies including the CDC and federal and state organizations. These policies and algorithms were followed during the patient's care in the ED.   As part of my medical decision making, I reviewed the following data within the [facility] notes reviewed and incorporated, Old chart reviewed, Radiograph reviewed chest x-ray is normal, Notes from prior ED visits and [place] Controlled Substance Database  ____________________________________________   FINAL CLINICAL IMPRESSION(S) / ED DIAGNOSES  Final diagnoses:  Suspected COVID-19 virus infection      NEW MEDICATIONS STARTED DURING THIS VISIT:  Discharge Medication List as of 08/27/2019  6:12 PM    START taking these medications   Details  albuterol (VENTOLIN HFA) 108 (90 Base) MCG/ACT inhaler Inhale 2 puffs into the lungs every 6 (six) hours as needed for wheezing or shortness of breath., Starting Fri 08/27/2019, Normal    azithromycin (ZITHROMAX Z-PAK) 250 MG tablet 2 pills today then 1 pill a day for 4 days, Normal    benzonatate (TESSALON) 200 MG capsule Take 1 capsule (200 mg total) by mouth 3 (three) times daily as needed for cough., Starting Fri 08/27/2019, Normal         Note:  This document was prepared using Dragon voice recognition software and may include unintentional dictation errors.    Versie Starks, PA-C 08/27/19 Nigel Bridgeman,  MD 08/27/19 Kathyrn Drown

## 2019-08-27 NOTE — Discharge Instructions (Signed)
Follow-up with your regular doctor if not improving in 3 days.  Return emergency department for worsening. Take the Zithromax for infection, Tessalon pearls for cough, and use your inhaler 2 puffs every 4-6 hours as needed. Over-the-counter Mucinex will also help.

## 2019-08-27 NOTE — ED Notes (Signed)
Ambulated with pt. SpO2 remained at 99%. Pulse 123 bpm.

## 2019-08-27 NOTE — ED Triage Notes (Signed)
Presents with generalized body aches low grade fever/chills ,h/a and sore throat  alos has cough States sxs' started about 1 week ago

## 2019-08-27 NOTE — ED Notes (Signed)
E-signature not working at this time. Pt verbalized understanding of D/C instructions, prescriptions and follow up care with no further questions at this time. Pt in NAD and ambulatory at time of D/C.  

## 2019-08-28 LAB — SARS CORONAVIRUS 2 (TAT 6-24 HRS): SARS Coronavirus 2: POSITIVE — AB

## 2019-08-29 ENCOUNTER — Encounter: Payer: Self-pay | Admitting: Physician Assistant

## 2019-08-29 ENCOUNTER — Telehealth: Payer: Self-pay | Admitting: Physician Assistant

## 2019-08-29 NOTE — Telephone Encounter (Signed)
Called to discuss with Jodi Mourning about Covid symptoms and the use of bamlanivimab or casirivimab/imdevimab, a monoclonal antibody infusion for those with mild to moderate Covid symptoms and at a high risk of hospitalization.     Pt is qualified for this infusion at the [facility] infusion center due to co-morbid conditions and/or a member of an at-risk group, however declines infusion at this time. He would like to think about it and talk to family and PCP about it. Symptoms tier reviewed as well as criteria for ending isolation.  Symptoms reviewed that would warrant ED/Hospital evaluation. Preventative practices reviewed. Patient verbalized understanding. Patient advised to call back if he decides that he does want to get infusion. Callback number to the infusion center given. Patient advised to go to Urgent care or ED with severe symptoms. Last date pt would be eligible for infusion is 09/01/19.   Patient Active Problem List   Diagnosis Date Noted  . AAA (abdominal aortic aneurysm) without rupture ([place]) 05/13/2016  . PVD (peripheral vascular disease) ([place]) 04/10/2016  . BP (high blood pressure) 03/14/2015  . Calculus of kidney 03/14/2015  . COPD, mild ([place]) 03/25/2014  . 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 Acid reflux 12/01/2013  . HLD (hyperlipidemia) 12/01/2013    Angelena Form PA-C

## 2020-04-24 ENCOUNTER — Other Ambulatory Visit: Payer: Self-pay

## 2020-04-24 ENCOUNTER — Encounter: Payer: Self-pay | Admitting: Emergency Medicine

## 2020-04-24 ENCOUNTER — Emergency Department
Admission: EM | Admit: 2020-04-24 | Discharge: 2020-04-24 | Disposition: A | Discharge status: Home / Self Care | Payer: BC Managed Care – PPO | Attending: Emergency Medicine | Admitting: Emergency Medicine

## 2020-04-24 ENCOUNTER — Emergency Department: Payer: BC Managed Care – PPO

## 2020-04-24 DIAGNOSIS — J069 Acute upper respiratory infection, unspecified: Secondary | ICD-10-CM | POA: Insufficient documentation

## 2020-04-24 DIAGNOSIS — Z7984 Long term (current) use of oral hypoglycemic drugs: Secondary | ICD-10-CM | POA: Diagnosis not present

## 2020-04-24 DIAGNOSIS — Z79899 Other long term (current) drug therapy: Secondary | ICD-10-CM | POA: Insufficient documentation

## 2020-04-24 DIAGNOSIS — Z20822 Contact with and (suspected) exposure to covid-19: Secondary | ICD-10-CM | POA: Diagnosis not present

## 2020-04-24 DIAGNOSIS — Z7982 Long term (current) use of aspirin: Secondary | ICD-10-CM | POA: Insufficient documentation

## 2020-04-24 DIAGNOSIS — F17210 Nicotine dependence, cigarettes, uncomplicated: Secondary | ICD-10-CM | POA: Diagnosis not present

## 2020-04-24 DIAGNOSIS — R059 Cough, unspecified: Secondary | ICD-10-CM | POA: Diagnosis present

## 2020-04-24 DIAGNOSIS — J449 Chronic obstructive pulmonary disease, unspecified: Secondary | ICD-10-CM | POA: Insufficient documentation

## 2020-04-24 DIAGNOSIS — I10 Essential (primary) hypertension: Secondary | ICD-10-CM | POA: Insufficient documentation

## 2020-04-24 LAB — RESP PANEL BY RT PCR (RSV, FLU A&B, COVID)
Influenza A by PCR: NEGATIVE
Influenza B by PCR: NEGATIVE
Respiratory Syncytial Virus by PCR: NEGATIVE
SARS Coronavirus 2 by RT PCR: NEGATIVE

## 2020-04-24 MED ORDER — AZITHROMYCIN 250 MG PO TABS: ORAL_TABLET | ORAL | 0 refills | Status: DC

## 2020-04-24 NOTE — ED Triage Notes (Signed)
Pt reports over the past week has had chills, cough, back pain and general bodyaches. Pt reports has been exposed and has had COVID before and it feels the same.

## 2020-04-24 NOTE — ED Provider Notes (Signed)
[facility] Emergency Department Provider Note  ____________________________________________  Time seen: Approximately 1:08 PM  I have reviewed the triage vital signs and the nursing notes.   HISTORY  Chief Complaint Back Pain, Cough, Chills, and Generalized Body Aches    HPI [name] is a 59 y.o. male with a PMH of COPD, hypertension that presents to emergency department for evaluation of fever, nasal congestion, nonproductive cough, mild intermittent shortness of breath for 4 days.  Patient tried to schedule an appointment for a Covid test today but could not find somewhere to get tested.  He has an albuterol inhaler at home for his COPD but does not use it.  No shortness of breath currently.  He has an appointment scheduled with his primary care doctor in 1 week.  No chest pain, vomiting, abdominal pain, diarrhea.  Past Medical History:  Diagnosis Date   AAA (abdominal aortic aneurysm) (HCC)    Acid reflux 12/01/2013   Arthritis    Benign fibroma of prostate 12/01/2013   Calculus of kidney 03/14/2015   COPD, mild ([place]) 03/25/2014   History of kidney stones    HLD (hyperlipidemia) 12/01/2013   Hypertension    Renal stones     Patient Active Problem List   Diagnosis Date Noted   AAA (abdominal aortic aneurysm) without rupture ([place]) 05/13/2016   PVD (peripheral vascular disease) ([place]) 04/10/2016   BP (high blood pressure) 03/14/2015   Calculus of kidney 03/14/2015   COPD, mild ([place]) 03/25/2014   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   [place], [place]   CYSTOSCOPY W/ URETERAL STENT PLACEMENT Left 01/28/2015   Procedure: CYSTOSCOPY WITH RETROGRADE PYELOGRAM/URETERAL STENT PLACEMENT;  Surgeon: Alexis Frock, MD;  Location: [facility] ORS;   Service: Urology;  Laterality: Left;   CYSTOSCOPY/URETEROSCOPY/HOLMIUM LASER/STENT PLACEMENT Left 02/08/2015   Procedure: CYSTOSCOPY/URETEROSCOPY/HOLMIUM LASER/STENT PLACEMENT;  Surgeon: Hollice Espy, MD;  Location: [facility] ORS;  Service: Urology;  Laterality: Left;   CYSTOSCOPY/URETEROSCOPY/HOLMIUM LASER/STENT PLACEMENT Left 01/07/2018   Procedure: CYSTOSCOPY/URETEROSCOPY/HOLMIUM LASER/STENT PLACEMENT;  Surgeon: Hollice Espy, MD;  Location: [facility] ORS;  Service: Urology;  Laterality: Left;   FACIAL COSMETIC SURGERY     21 fractures in left cheek and face from baseball   LITHOTRIPSY     VASECTOMY      Prior to Admission medications   Medication Sig Start Date End Date Taking? Authorizing Provider  albuterol (VENTOLIN HFA) 108 (90 Base) MCG/ACT inhaler Inhale 2 puffs into the lungs every 6 (six) hours as needed for wheezing or shortness of breath. 08/27/19   Caryn Section Linden Dolin, PA-C  aspirin EC 325 MG tablet Take 1 tablet (325 mg total) by mouth daily. 05/04/19   Carrie Mew, MD  atorvastatin (LIPITOR) 10 MG tablet Take 10 mg by mouth every morning.  01/25/15   [provider]  atorvastatin (LIPITOR) 40 MG tablet  02/03/18   [provider]  azithromycin (ZITHROMAX Z-PAK) 250 MG tablet Take 2 tablets (500 mg) on  Day 1,  followed by 1 tablet (250 mg) once daily on Days 2 through 5. 04/24/20   Laban Emperor, PA-C  benzonatate (TESSALON) 200 MG capsule Take 1 capsule (200 mg total) by mouth 3 (three) times daily as needed for cough. 08/27/19   Caryn Section Linden Dolin, PA-C  esomeprazole ([place]) 20 MG  capsule Take 20 mg by mouth every morning.    [provider]  fenofibrate (TRICOR) 145 MG tablet Take 145 mg by mouth every morning.  01/17/15   [provider]  Ipratropium-Albuterol (COMBIVENT RESPIMAT) 20-100 MCG/ACT AERS respimat Inhale 1 puff into the lungs 4 (four) times daily as needed for wheezing.  03/25/14   [provider]  lisinopril (PRINIVIL,ZESTRIL) 10  MG tablet Take 10 mg by mouth every morning.     [provider]  meloxicam (MOBIC) 15 MG tablet Take 15 mg by mouth every morning.  12/05/17   [provider]  metFORMIN (GLUCOPHAGE) 500 MG tablet Take 500 mg by mouth 2 (two) times daily with a meal. 12/03/17   [provider]  Multiple Vitamin (MULTI-VITAMINS) TABS Take 1 tablet by mouth every morning.     [provider]  ondansetron (ZOFRAN ODT) 4 MG disintegrating tablet Take 1 tablet (4 mg total) by mouth every 8 (eight) hours as needed for nausea or vomiting. 12/24/17   Gregor Hams, MD    Allergies Patient has no known allergies.  Family History  Problem Relation Age of Onset   Diabetes Mother    Diabetes Maternal Uncle    Cancer Maternal Uncle    Cancer Maternal Uncle    Prostate cancer Neg Hx    Bladder Cancer Neg Hx    Kidney cancer Neg Hx     Social History Social History   Tobacco Use   Smoking status: Current Every Day Smoker    Packs/day: 10.00    Years: 30.00    Pack years: 300.00    Types: Cigarettes   Smokeless tobacco: Never Used  Scientific laboratory technician Use: Never used  Substance Use Topics   Alcohol use: No    Alcohol/week: 0.0 standard drinks   Drug use: No     Review of Systems  Constitutional: Positive for low grade fever. Eyes: No visual changes. No discharge. ENT: Positive for congestion and rhinorrhea. Cardiovascular: No chest pain. Respiratory: Positive for cough. Minimal SOB. Gastrointestinal: No abdominal pain.  No nausea, no vomiting.  No diarrhea.  No constipation. Musculoskeletal: Positive for body aches. Skin: Negative for rash, abrasions, lacerations, ecchymosis. Neurological: Negative for headaches.   ____________________________________________   PHYSICAL EXAM:  VITAL SIGNS: ED Triage Vitals  Enc Vitals Group     BP 04/24/20 0959 (!) 148/88     Pulse Rate 04/24/20 0959 99     Resp 04/24/20 0959 20     Temp 04/24/20 0959  97.8 F (36.6 C)     Temp Source 04/24/20 0959 Oral     SpO2 04/24/20 0959 100 %     Weight 04/24/20 0959 225 lb (102.1 kg)     Height 04/24/20 0958 5\' 9"  (1.753 m)     Head Circumference --      Peak Flow --      Pain Score 04/24/20 0958 8     Pain Loc --      Pain Edu? --      Excl. in [place]? --      Constitutional: Alert and oriented. Well appearing and in no acute distress. Eyes: Conjunctivae are normal. PERRL. EOMI. No discharge. Head: Atraumatic. ENT: No frontal and maxillary sinus tenderness.      Ears: Tympanic membranes pearly gray with good landmarks. No discharge.      Nose: Mild congestion/rhinnorhea.      Mouth/Throat: Mucous membranes are moist. Oropharynx non-erythematous. Tonsils not enlarged.  No exudates. Uvula midline. Neck: No stridor.   Hematological/Lymphatic/Immunilogical: No cervical lymphadenopathy. Cardiovascular: Normal rate, regular rhythm.  Good peripheral circulation. Respiratory: Normal respiratory effort without tachypnea or retractions. Lungs CTAB. Good air entry to the bases with no decreased or absent breath sounds. Gastrointestinal: Bowel sounds 4 quadrants. Soft and nontender to palpation. No guarding or rigidity. No palpable masses. No distention. Musculoskeletal: Full range of motion to all extremities. No gross deformities appreciated. Neurologic:  Normal speech and language. No gross focal neurologic deficits are appreciated.  Skin:  Skin is warm, dry and intact. No rash noted. Psychiatric: Mood and affect are normal. Speech and behavior are normal. Patient exhibits appropriate insight and judgement.   ____________________________________________   LABS (all labs ordered are listed, but only abnormal results are displayed)  Labs Reviewed  RESP PANEL BY RT PCR (RSV, FLU A&B, COVID)   ____________________________________________  EKG   ____________________________________________  RADIOLOGY Robinette Haines, personally viewed and  evaluated these images (plain radiographs) as part of my medical decision making, as well as reviewing the written report by the radiologist.  DG Chest 2 View  Result Date: 04/24/2020 CLINICAL DATA:  Back pain, cough. EXAM: CHEST - 2 VIEW COMPARISON:  August 27, 2019. FINDINGS: The heart size and mediastinal contours are within normal limits. Minimal bibasilar subsegmental atelectasis or scarring is noted. No pneumothorax or pleural effusion is noted. The visualized skeletal structures are unremarkable. IMPRESSION: Minimal bibasilar subsegmental atelectasis or scarring. Electronically Signed   By: Marijo Conception M.D.   On: 04/24/2020 10:24    ____________________________________________    PROCEDURES  Procedure(s) performed:    Procedures    Medications - No data to display   ____________________________________________   INITIAL IMPRESSION / ASSESSMENT AND PLAN / ED COURSE  Pertinent labs & imaging results that were available during my care of the patient were reviewed by me and considered in my medical decision making (see chart for details).  Review of the [place] CSRS was performed in accordance of the [place] prior to dispensing any controlled drugs.   Patient's diagnosis is consistent with URI. Vital signs and exam are reassuring.  Covid, influenza are negative.  Chest x-ray shows minimal bibasilar subsegmental atelectasis or scarring.  Patient will be covered for atypical pneumonia with azithromycin.  Patient denies any significant shortness of breath.  He does have a history of COPD and he will begin using his albuterol inhaler that he has a prescription for at home.  He has a history of diabetes so we will avoid steroids at this time.  Patient appears well and is staying well hydrated.  Patient feels comfortable going home. Patient will be discharged home with prescriptions for azithromycin. Patient is to follow up with PCP as needed or otherwise directed. Patient is given ED  precautions to return to the ED for any worsening or new symptoms.  [name] was evaluated in Emergency Department on 04/24/2020 for the symptoms described in the history of present illness. He was evaluated in the context of the global COVID-19 pandemic, which necessitated consideration that the patient might be at risk for infection with the SARS-CoV-2 virus that causes COVID-19. Institutional protocols and algorithms that pertain to the evaluation of patients at risk for COVID-19 are in a state of rapid change based on information released by regulatory bodies including the CDC and federal and state organizations. These policies and algorithms were followed during the patient's care in the ED.   ____________________________________________  FINAL CLINICAL IMPRESSION(S) /  ED DIAGNOSES  Final diagnoses:  Upper respiratory tract infection, unspecified type      NEW MEDICATIONS STARTED DURING THIS VISIT:  ED Discharge Orders         Ordered    azithromycin (ZITHROMAX Z-PAK) 250 MG tablet        04/24/20 1318              This chart was dictated using voice recognition software/Dragon. Despite best efforts to proofread, errors can occur which can change the meaning. Any change was purely unintentional.    Laban Emperor, PA-C 04/24/20 1504    Cheri Fowler Vista Lawman, MD 04/24/20 [phone]

## 2020-04-24 NOTE — ED Notes (Signed)
Says he needs a covid test.  Says he tried to find out where they were doing testing and could not find anywbere, including urgent care.  Short of breath, back pain, headache, chills with no fever.  Says he had covid in march 2021.

## 2020-08-02 ENCOUNTER — Other Ambulatory Visit: Payer: BC Managed Care – PPO

## 2020-08-02 DIAGNOSIS — Z20822 Contact with and (suspected) exposure to covid-19: Secondary | ICD-10-CM

## 2020-08-04 LAB — NOVEL CORONAVIRUS, NAA: SARS-CoV-2, NAA: NOT DETECTED

## 2020-08-04 LAB — SARS-COV-2, NAA 2 DAY TAT

## 2020-08-16 ENCOUNTER — Other Ambulatory Visit: Payer: BC Managed Care – PPO

## 2020-08-16 DIAGNOSIS — Z20822 Contact with and (suspected) exposure to covid-19: Secondary | ICD-10-CM

## 2020-08-18 LAB — SARS-COV-2, NAA 2 DAY TAT

## 2020-08-18 LAB — NOVEL CORONAVIRUS, NAA: SARS-CoV-2, NAA: NOT DETECTED

## 2020-11-01 ENCOUNTER — Emergency Department: Payer: BC Managed Care – PPO

## 2020-11-01 ENCOUNTER — Observation Stay
Admission: EM | Admit: 2020-11-01 | Discharge: 2020-11-02 | Disposition: A | Discharge status: Home / Self Care | Payer: BC Managed Care – PPO | Attending: Internal Medicine | Admitting: Internal Medicine

## 2020-11-01 ENCOUNTER — Other Ambulatory Visit: Payer: Self-pay

## 2020-11-01 DIAGNOSIS — R0602 Shortness of breath: Secondary | ICD-10-CM | POA: Diagnosis present

## 2020-11-01 DIAGNOSIS — E1169 Type 2 diabetes mellitus with other specified complication: Secondary | ICD-10-CM | POA: Diagnosis present

## 2020-11-01 DIAGNOSIS — I10 Essential (primary) hypertension: Secondary | ICD-10-CM | POA: Insufficient documentation

## 2020-11-01 DIAGNOSIS — I714 Abdominal aortic aneurysm, without rupture: Secondary | ICD-10-CM | POA: Diagnosis not present

## 2020-11-01 DIAGNOSIS — R079 Chest pain, unspecified: Principal | ICD-10-CM | POA: Insufficient documentation

## 2020-11-01 DIAGNOSIS — Z20822 Contact with and (suspected) exposure to covid-19: Secondary | ICD-10-CM | POA: Insufficient documentation

## 2020-11-01 DIAGNOSIS — E785 Hyperlipidemia, unspecified: Secondary | ICD-10-CM | POA: Insufficient documentation

## 2020-11-01 DIAGNOSIS — J449 Chronic obstructive pulmonary disease, unspecified: Secondary | ICD-10-CM | POA: Insufficient documentation

## 2020-11-01 DIAGNOSIS — Z79899 Other long term (current) drug therapy: Secondary | ICD-10-CM | POA: Diagnosis not present

## 2020-11-01 DIAGNOSIS — E119 Type 2 diabetes mellitus without complications: Secondary | ICD-10-CM

## 2020-11-01 DIAGNOSIS — E11649 Type 2 diabetes mellitus with hypoglycemia without coma: Secondary | ICD-10-CM

## 2020-11-01 DIAGNOSIS — Z7982 Long term (current) use of aspirin: Secondary | ICD-10-CM | POA: Diagnosis not present

## 2020-11-01 DIAGNOSIS — E1159 Type 2 diabetes mellitus with other circulatory complications: Secondary | ICD-10-CM | POA: Diagnosis present

## 2020-11-01 DIAGNOSIS — Z87442 Personal history of urinary calculi: Secondary | ICD-10-CM | POA: Diagnosis not present

## 2020-11-01 DIAGNOSIS — I739 Peripheral vascular disease, unspecified: Secondary | ICD-10-CM | POA: Insufficient documentation

## 2020-11-01 DIAGNOSIS — Z7951 Long term (current) use of inhaled steroids: Secondary | ICD-10-CM | POA: Insufficient documentation

## 2020-11-01 LAB — CBC WITH DIFFERENTIAL/PLATELET
Abs Immature Granulocytes: 0.06 10*3/uL (ref 0.00–0.07)
Basophils Absolute: 0.1 10*3/uL (ref 0.0–0.1)
Basophils Relative: 1 %
Eosinophils Absolute: 0.6 10*3/uL — ABNORMAL HIGH (ref 0.0–0.5)
Eosinophils Relative: 6 %
HCT: 47.1 % (ref 39.0–52.0)
Hemoglobin: 15.9 g/dL (ref 13.0–17.0)
Immature Granulocytes: 1 %
Lymphocytes Relative: 35 %
Lymphs Abs: 3.5 10*3/uL (ref 0.7–4.0)
MCH: 32.1 pg (ref 26.0–34.0)
MCHC: 33.8 g/dL (ref 30.0–36.0)
MCV: 95.2 fL (ref 80.0–100.0)
Monocytes Absolute: 0.8 10*3/uL (ref 0.1–1.0)
Monocytes Relative: 8 %
Neutro Abs: 4.9 10*3/uL (ref 1.7–7.7)
Neutrophils Relative %: 49 %
Platelets: 272 10*3/uL (ref 150–400)
RBC: 4.95 MIL/uL (ref 4.22–5.81)
RDW: 13.7 % (ref 11.5–15.5)
WBC: 9.9 10*3/uL (ref 4.0–10.5)
nRBC: 0 % (ref 0.0–0.2)

## 2020-11-01 LAB — COMPREHENSIVE METABOLIC PANEL
ALT: 24 U/L (ref 0–44)
AST: 22 U/L (ref 15–41)
Albumin: 3.6 g/dL (ref 3.5–5.0)
Alkaline Phosphatase: 34 U/L — ABNORMAL LOW (ref 38–126)
Anion gap: 7 (ref 5–15)
BUN: 16 mg/dL (ref 6–20)
CO2: 23 mmol/L (ref 22–32)
Calcium: 9 mg/dL (ref 8.9–10.3)
Chloride: 111 mmol/L (ref 98–111)
Creatinine, Ser: 1.19 mg/dL (ref 0.61–1.24)
GFR, Estimated: >60 mL/min (ref >60)
Glucose, Bld: 101 mg/dL — ABNORMAL HIGH (ref 70–99)
Potassium: 4.3 mmol/L (ref 3.5–5.1)
Sodium: 141 mmol/L (ref 135–145)
Total Bilirubin: 0.7 mg/dL (ref 0.3–1.2)
Total Protein: 6.7 g/dL (ref 6.5–8.1)

## 2020-11-01 LAB — LIPASE, BLOOD: Lipase: 36 U/L (ref 11–51)

## 2020-11-01 LAB — TROPONIN I (HIGH SENSITIVITY): Troponin I (High Sensitivity): 6 ng/L (ref <18)

## 2020-11-01 LAB — D-DIMER, QUANTITATIVE: D-Dimer, Quant: 0.47 ug/mL-FEU (ref 0.00–0.50)

## 2020-11-01 LAB — BRAIN NATRIURETIC PEPTIDE: B Natriuretic Peptide: 21.8 pg/mL (ref 0.0–100.0)

## 2020-11-01 LAB — PROTIME-INR
INR: 1 (ref 0.8–1.2)
Prothrombin Time: 12.7 seconds (ref 11.4–15.2)

## 2020-11-01 LAB — CBG MONITORING, ED: Glucose-Capillary: 93 mg/dL (ref 70–99)

## 2020-11-01 LAB — APTT: aPTT: 28 seconds (ref 24–36)

## 2020-11-01 MED ORDER — IPRATROPIUM-ALBUTEROL 0.5-2.5 (3) MG/3ML IN SOLN
3.0000 mL | Freq: Once | RESPIRATORY_TRACT | Status: AC
  Administered 2020-11-01: 3 mL via RESPIRATORY_TRACT
  Filled 2020-11-01: qty 3

## 2020-11-01 MED ORDER — ASPIRIN 81 MG PO CHEW
324.0000 mg | CHEWABLE_TABLET | Freq: Once | ORAL | Status: AC
  Administered 2020-11-01: 324 mg via ORAL
  Filled 2020-11-01: qty 4

## 2020-11-01 NOTE — H&P (Signed)
History and Physical    [name] DOB: 1961/01/22 DOA: 11/01/2020  PCP: Sofie Hartigan, MD  Patient coming from: Home  I have personally briefly reviewed patient's old medical records in [place]  Chief Complaint: Chest pain  HPI: [name] is a 60 y.o. male with medical history significant for CAD (s/p cardiac cath and stenting >20 yrs ago per patient), COPD, HTN, HLD, T2DM, GERD, and AAA who presents to the ED for evaluation of chest pain.  Patient reports 2-day history of intermittent sharp low sternal chest pain without radiation.  Pain occurs with activity or while at rest and is associated with shortness of breath and numbness/tingling in both his hands.  Episodes last for about 15 seconds at a time.  He says the symptoms are noticeably different than his chronic GERD symptoms.  He has a chronic dry cough which is unchanged from his baseline.  While at work, his symptoms recurred and he felt lightheaded and as if he was going to pass out therefore he came to the ED for further evaluation.  He denies any associated nausea, vomiting, abdominal pain, dysuria, or lower extremity swelling.  Patient reports a similar episode over 20 years ago.  He says he underwent cardiac catheterization at [facility] in [place], [place] and had a stent placed at that time.  He is a chronic smoker of about half pack per day since he was a teenager.  ED Course:  Initial vitals showed BP 157/83, pulse 92, RR 20, temp 97.7 F, SPO2 97% on room air.  Labs show sodium 141, potassium 4.3, bicarb 23, BUN 16, creatinine 1.19, serum glucose 101, lipase 36, BNP 21.8, high-sensitivity troponin I 6, WBC 9.9, hemoglobin 15.9, platelets 272,000, D-dimer 0.47.  2 view chest x-ray shows mild stable bibasilar linear scarring and/or atelectasis without focal consolidation, edema, or effusion.  CT head without contrast is negative for acute intracranial abnormality.  Patient  was given aspirin 324 mg and DuoNeb treatment.  The hospitalist service was consulted to admit for further evaluation and management.  Review of Systems: All systems reviewed and are negative except as documented in history of present illness above.   Past Medical History:  Diagnosis Date  . AAA (abdominal aortic aneurysm) ([place])   . Acid reflux 12/01/2013  . Arthritis   . 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 Calculus of kidney 03/14/2015  . COPD, mild ([place]) 03/25/2014  . History of kidney stones   . HLD (hyperlipidemia) 12/01/2013  . Hypertension   . Renal stones     Past Surgical History:  Procedure Laterality Date  . APPENDECTOMY    . CARDIAC CATHETERIZATION    . CORONARY ANGIOPLASTY  2004?   [place], [place]  . CYSTOSCOPY W/ URETERAL STENT PLACEMENT Left 01/28/2015   Procedure: CYSTOSCOPY WITH RETROGRADE PYELOGRAM/URETERAL STENT PLACEMENT;  Surgeon: Alexis Frock, MD;  Location: [facility] ORS;  Service: Urology;  Laterality: Left;  . CYSTOSCOPY/URETEROSCOPY/HOLMIUM LASER/STENT PLACEMENT Left 02/08/2015   Procedure: CYSTOSCOPY/URETEROSCOPY/HOLMIUM LASER/STENT PLACEMENT;  Surgeon: Hollice Espy, MD;  Location: [facility] ORS;  Service: Urology;  Laterality: Left;  . CYSTOSCOPY/URETEROSCOPY/HOLMIUM LASER/STENT PLACEMENT Left 01/07/2018   Procedure: CYSTOSCOPY/URETEROSCOPY/HOLMIUM LASER/STENT PLACEMENT;  Surgeon: Hollice Espy, MD;  Location: [facility] ORS;  Service: Urology;  Laterality: Left;  . FACIAL COSMETIC SURGERY     21 fractures in left cheek and face from baseball  . LITHOTRIPSY    . VASECTOMY      Social History:  reports that he has been  smoking cigarettes. He has a 300.00 pack-year smoking history. He has never used smokeless tobacco. He reports that he does not drink alcohol and does not use drugs.  No Known Allergies  Family History  Problem Relation Age of Onset  . Diabetes Mother   . Diabetes Maternal Uncle   . Cancer Maternal Uncle   . Cancer Maternal Uncle   . Prostate  cancer Neg Hx   . Bladder Cancer Neg Hx   . Kidney cancer Neg Hx      Prior to Admission medications   Medication Sig Start Date End Date Taking? Authorizing Provider  albuterol (VENTOLIN HFA) 108 (90 Base) MCG/ACT inhaler Inhale 2 puffs into the lungs every 6 (six) hours as needed for wheezing or shortness of breath. 08/27/19   Caryn Section Linden Dolin, PA-C  aspirin EC 325 MG tablet Take 1 tablet (325 mg total) by mouth daily. 05/04/19   Carrie Mew, MD  atorvastatin (LIPITOR) 10 MG tablet Take 10 mg by mouth every morning.  01/25/15   [provider]  atorvastatin (LIPITOR) 40 MG tablet  02/03/18   [provider]  azithromycin (ZITHROMAX Z-PAK) 250 MG tablet Take 2 tablets (500 mg) on  Day 1,  followed by 1 tablet (250 mg) once daily on Days 2 through 5. 04/24/20   Laban Emperor, PA-C  benzonatate (TESSALON) 200 MG capsule Take 1 capsule (200 mg total) by mouth 3 (three) times daily as needed for cough. 08/27/19   Caryn Section Linden Dolin, PA-C  esomeprazole (NEXIUM) 20 MG capsule Take 20 mg by mouth every morning.    [provider]  fenofibrate (TRICOR) 145 MG tablet Take 145 mg by mouth every morning.  01/17/15   [provider]  Ipratropium-Albuterol (COMBIVENT RESPIMAT) 20-100 MCG/ACT AERS respimat Inhale 1 puff into the lungs 4 (four) times daily as needed for wheezing.  03/25/14   [provider]  lisinopril (PRINIVIL,ZESTRIL) 10 MG tablet Take 10 mg by mouth every morning.     [provider]  meloxicam (MOBIC) 15 MG tablet Take 15 mg by mouth every morning.  12/05/17   [provider]  metFORMIN (GLUCOPHAGE) 500 MG tablet Take 500 mg by mouth 2 (two) times daily with a meal. 12/03/17   [provider]  Multiple Vitamin (MULTI-VITAMINS) TABS Take 1 tablet by mouth every morning.     [provider]  ondansetron (ZOFRAN ODT) 4 MG disintegrating tablet Take 1 tablet (4 mg total) by mouth every 8 (eight) hours as needed for nausea  or vomiting. 12/24/17   Gregor Hams, MD    Physical Exam: Vitals:   11/01/20 2057 11/01/20 2130 11/01/20 2200 11/01/20 2300  BP: (!) 157/83 117/85 125/73 114/81  Pulse: 92 88 85 79  Resp: 20 (!) 21 (!) 21 18  Temp: 97.7 F (36.5 C)     TempSrc: Oral     SpO2: 97% 95% 94% 93%  Weight: 101.6 kg     Height: 5\' 9"  (1.753 m)      Constitutional: Resting supine in bed, NAD, calm, comfortable Eyes: PERRL, lids and conjunctivae normal ENMT: Mucous membranes are moist. Posterior pharynx clear of any exudate or lesions.Normal dentition.  Neck: normal, supple, no masses. Respiratory: clear to auscultation bilaterally, no wheezing, no crackles. Normal respiratory effort. No accessory muscle use.  Cardiovascular: Regular rate and rhythm, no murmurs / rubs / gallops. No extremity edema. 2+ pedal pulses. Abdomen: no tenderness, no masses palpated. No hepatosplenomegaly. Bowel sounds positive.  Musculoskeletal:  no clubbing / cyanosis. No joint deformity upper and lower extremities. Good ROM, no contractures. Normal muscle tone.  Skin: no rashes, lesions, ulcers. No induration Neurologic: CN 2-12 grossly intact. Sensation intact. Strength 5/5 in all 4.  Psychiatric: Normal judgment and insight. Alert and oriented x 3. Normal mood.   Labs on Admission: I have personally reviewed following labs and imaging studies  CBC: Recent Labs  Lab 11/01/20 2119  WBC 9.9  NEUTROABS 4.9  HGB 15.9  HCT 47.1  MCV 95.2  PLT 098   Basic Metabolic Panel: Recent Labs  Lab 11/01/20 2119  NA 141  K 4.3  CL 111  CO2 23  GLUCOSE 101*  BUN 16  CREATININE 1.19  CALCIUM 9.0   GFR: Estimated Creatinine Clearance: 79.5 mL/min (by C-G formula based on SCr of 1.19 mg/dL). Liver Function Tests: Recent Labs  Lab 11/01/20 2119  AST 22  ALT 24  ALKPHOS 34*  BILITOT 0.7  PROT 6.7  ALBUMIN 3.6   Recent Labs  Lab 11/01/20 2119  LIPASE 36   No results for input(s): AMMONIA in the last 168  hours. Coagulation Profile: Recent Labs  Lab 11/01/20 2119  INR 1.0   Cardiac Enzymes: No results for input(s): CKTOTAL, CKMB, CKMBINDEX, TROPONINI in the last 168 hours. BNP (last 3 results) No results for input(s): PROBNP in the last 8760 hours. HbA1C: No results for input(s): HGBA1C in the last 72 hours. CBG: Recent Labs  Lab 11/01/20 2110  GLUCAP 93   Lipid Profile: No results for input(s): CHOL, HDL, LDLCALC, TRIG, CHOLHDL, LDLDIRECT in the last 72 hours. Thyroid Function Tests: No results for input(s): TSH, T4TOTAL, FREET4, T3FREE, THYROIDAB in the last 72 hours. Anemia Panel: No results for input(s): VITAMINB12, FOLATE, FERRITIN, TIBC, IRON, RETICCTPCT in the last 72 hours. Urine analysis:    Component Value Date/Time   COLORURINE YELLOW (A) 12/23/2017 2347   APPEARANCEUR Clear 12/25/2017 1327   LABSPEC 1.021 12/23/2017 2347   LABSPEC 1.020 09/25/2012 1155   PHURINE 5.0 12/23/2017 2347   GLUCOSEU Negative 12/25/2017 1327   GLUCOSEU Negative 09/25/2012 1155   HGBUR LARGE (A) 12/23/2017 2347   BILIRUBINUR Negative 12/25/2017 1327   BILIRUBINUR Negative 09/25/2012 [place] 12/23/2017 2347   PROTEINUR Negative 12/25/2017 1327   PROTEINUR 30 (A) 12/23/2017 2347   NITRITE Negative 12/25/2017 1327   NITRITE NEGATIVE 12/23/2017 2347   LEUKOCYTESUR Negative 12/25/2017 1327   LEUKOCYTESUR Negative 09/25/2012 1155    Radiological Exams on Admission: DG Chest 2 View  Result Date: 11/01/2020 CLINICAL DATA:  Shortness of breath and chest pain. EXAM: CHEST - 2 VIEW COMPARISON:  April 24, 2020 FINDINGS: Mild, stable linear scarring and/or atelectasis is seen within the bilateral lung bases. There is no evidence of a pleural effusion or pneumothorax. The heart size and mediastinal contours are within normal limits. The visualized skeletal structures are unremarkable. IMPRESSION: Mild, stable bibasilar linear scarring and/or atelectasis. Electronically Signed    By: Virgina Norfolk M.D.   On: 11/01/2020 22:15   CT Head Wo Contrast  Result Date: 11/01/2020 CLINICAL DATA:  Blurry vision.  Bilateral hand numbness. EXAM: CT HEAD WITHOUT CONTRAST TECHNIQUE: Contiguous axial images were obtained from the base of the skull through the vertex without intravenous contrast. COMPARISON:  None. FINDINGS: Brain: No intracranial hemorrhage, mass effect, or midline shift. No hydrocephalus. The basilar cisterns are patent. No evidence of territorial infarct or acute ischemia. No extra-axial or intracranial fluid collection. Vascular: Atherosclerosis of skullbase  vasculature without hyperdense vessel or abnormal calcification. Skull: No fracture or focal lesion. Sinuses/Orbits: Paranasal sinuses and mastoid air cells are clear. The visualized orbits are unremarkable. Postsurgical change of the right maxillary sinus. Other: None. IMPRESSION: No acute intracranial abnormality. Electronically Signed   By: Keith Rake M.D.   On: 11/01/2020 22:23    EKG: Personally reviewed.  Normal sinus rhythm without acute ischemic changes.  Assessment/Plan Principal Problem:   Chest pain Active Problems:   Hypertension associated with diabetes ([place])   Type 2 diabetes mellitus ([place])   Hyperlipidemia associated with type 2 diabetes mellitus (HCC)   [name] is a 60 y.o. male with medical history significant for CAD (s/p cardiac cath and stenting >20 yrs ago per patient), COPD, HTN, HLD, T2DM, GERD, and AAA who is admitted for chest pain evaluation.  Chest pain Reported history of CAD s/p stenting: Patient with atypical chest pain.  High-sensitivity troponin I is negative x2.  EKG without acute ischemic changes.  D-dimer is not elevated.  He reports prior cardiac stenting over 20 years ago in [place].  Refuses trial of nitroglycerin due to potential side effect of headache. -Monitor on telemetry -Obtain echocardiogram -Consider further inpatient versus outpatient ischemic  work-up  COPD: Stable.  Continue albuterol as needed.  Type 2 diabetes: Hold home Metformin, Actos and place on SSI.  Last A1c 7.1% on 10/23/2020.  Hypertension: Continue lisinopril.  Hyperlipidemia: Continue atorvastatin.  DVT prophylaxis: Lovenox Code Status: Full code, confirmed with patient Family Communication: Discussed with patient, he will discuss with family Disposition Plan: From home, likely discharged home pending chest pain rule out Consults called: None Level of care: Med-Surg Admission status:  Status is: Observation  The patient remains OBS appropriate and will d/c before 2 midnights.  Dispo: The patient is from: Home              Anticipated d/c is to: Home              Patient currently is not medically stable to d/c.   Difficult to place patient No  Zada Finders MD [facility]  If 7PM-7AM, please contact night-coverage [email or web address]  11/01/2020, 11:45 PM

## 2020-11-01 NOTE — ED Triage Notes (Addendum)
Pt states he started having blurry vision and numbness to lbilateral hands around aprox 1930 this evening. Pt states he has chest pain as well in the middle of his chest, pt states sob with pain. Pt has hx of copd. No facial droop or slurred speech at this time.

## 2020-11-02 ENCOUNTER — Observation Stay (HOSPITAL_BASED_OUTPATIENT_CLINIC_OR_DEPARTMENT_OTHER)
Admit: 2020-11-02 | Discharge: 2020-11-02 | Disposition: A | Discharge status: Home / Self Care | Payer: BC Managed Care – PPO | Attending: Internal Medicine | Admitting: Internal Medicine

## 2020-11-02 DIAGNOSIS — E785 Hyperlipidemia, unspecified: Secondary | ICD-10-CM | POA: Diagnosis not present

## 2020-11-02 DIAGNOSIS — I5189 Other ill-defined heart diseases: Secondary | ICD-10-CM

## 2020-11-02 DIAGNOSIS — R079 Chest pain, unspecified: Secondary | ICD-10-CM

## 2020-11-02 DIAGNOSIS — E1169 Type 2 diabetes mellitus with other specified complication: Secondary | ICD-10-CM

## 2020-11-02 DIAGNOSIS — E1159 Type 2 diabetes mellitus with other circulatory complications: Secondary | ICD-10-CM | POA: Diagnosis not present

## 2020-11-02 DIAGNOSIS — I152 Hypertension secondary to endocrine disorders: Secondary | ICD-10-CM

## 2020-11-02 HISTORY — DX: Other ill-defined heart diseases: I51.89

## 2020-11-02 LAB — ECHOCARDIOGRAM COMPLETE
AR max vel: 2.45 cm2
AV Area VTI: 2.48 cm2
AV Area mean vel: 2.37 cm2
AV Mean grad: 4 mmHg
AV Peak grad: 7.6 mmHg
Ao pk vel: 1.38 m/s
Area-P 1/2: 3.16 cm2
Calc EF: 49.1 %
Height: 69 in
MV VTI: 2.9 cm2
S' Lateral: 4.2 cm
Single Plane A2C EF: 43.2 %
Single Plane A4C EF: 54.4 %
Weight: 3584 oz

## 2020-11-02 LAB — RESP PANEL BY RT-PCR (FLU A&B, COVID) ARPGX2
Influenza A by PCR: NEGATIVE
Influenza B by PCR: NEGATIVE
SARS Coronavirus 2 by RT PCR: NEGATIVE

## 2020-11-02 LAB — HIV ANTIBODY (ROUTINE TESTING W REFLEX): HIV Screen 4th Generation wRfx: NONREACTIVE

## 2020-11-02 LAB — CBG MONITORING, ED
Glucose-Capillary: 89 mg/dL (ref 70–99)
Glucose-Capillary: 91 mg/dL (ref 70–99)

## 2020-11-02 LAB — TROPONIN I (HIGH SENSITIVITY): Troponin I (High Sensitivity): 4 ng/L (ref <18)

## 2020-11-02 MED ORDER — INSULIN ASPART 100 UNIT/ML ~~LOC~~ SOLN: 0.0000 [IU] | Freq: Three times a day (TID) | SUBCUTANEOUS | Status: DC

## 2020-11-02 MED ORDER — ATORVASTATIN CALCIUM 20 MG PO TABS
40.0000 mg | ORAL_TABLET | Freq: Every day | ORAL | Status: DC
  Administered 2020-11-02: 40 mg via ORAL
  Filled 2020-11-02: qty 2

## 2020-11-02 MED ORDER — PANTOPRAZOLE SODIUM 40 MG PO TBEC
40.0000 mg | DELAYED_RELEASE_TABLET | Freq: Every day | ORAL | Status: DC
  Administered 2020-11-02: 40 mg via ORAL
  Filled 2020-11-02: qty 1

## 2020-11-02 MED ORDER — ONDANSETRON HCL 4 MG/2ML IJ SOLN: 4.0000 mg | Freq: Four times a day (QID) | INTRAMUSCULAR | Status: DC | PRN

## 2020-11-02 MED ORDER — ENOXAPARIN SODIUM 60 MG/0.6ML ~~LOC~~ SOLN
0.5000 mg/kg | SUBCUTANEOUS | Status: DC
  Filled 2020-11-02: qty 0.6

## 2020-11-02 MED ORDER — ALBUTEROL SULFATE HFA 108 (90 BASE) MCG/ACT IN AERS
2.0000 | INHALATION_SPRAY | Freq: Four times a day (QID) | RESPIRATORY_TRACT | Status: DC | PRN
  Filled 2020-11-02: qty 6.7

## 2020-11-02 MED ORDER — LISINOPRIL 10 MG PO TABS
10.0000 mg | ORAL_TABLET | Freq: Every morning | ORAL | Status: DC
  Administered 2020-11-02: 10 mg via ORAL
  Filled 2020-11-02: qty 1

## 2020-11-02 MED ORDER — ACETAMINOPHEN 325 MG PO TABS: 650.0000 mg | ORAL_TABLET | ORAL | Status: DC | PRN

## 2020-11-02 MED ORDER — ASPIRIN EC 325 MG PO TBEC
325.0000 mg | DELAYED_RELEASE_TABLET | Freq: Every day | ORAL | Status: DC
  Administered 2020-11-02: 325 mg via ORAL
  Filled 2020-11-02: qty 1

## 2020-11-02 NOTE — ED Notes (Signed)
Informed RN bed  Assigned [phone]

## 2020-11-02 NOTE — Progress Notes (Signed)
Anticoagulation monitoring(Lovenox):  59yo  M ordered Lovenox 40 mg Q24h  Filed Weights   11/01/20 2057  Weight: 101.6 kg (224 lb)   BMI 33   Lab Results  Component Value Date   CREATININE 1.19 11/01/2020   CREATININE 0.96 05/04/2019   CREATININE 1.29 (H) 12/23/2017   Estimated Creatinine Clearance: 79.5 mL/min (by C-G formula based on SCr of 1.19 mg/dL). Hemoglobin & Hematocrit     Component Value Date/Time   HGB 15.9 11/01/2020 2119   HGB 18.9 (H) 09/25/2012 1155   HCT 47.1 11/01/2020 2119   HCT 55.7 (H) 09/25/2012 1155     Per Protocol for Patient with estCrcl > 30 ml/min and BMI > 30, will transition to Lovenox 0.5 mg/kg Q24h      Chinita Greenland PharmD Clinical Pharmacist 11/02/2020

## 2020-11-02 NOTE — ED Notes (Signed)
Echo tech in room

## 2020-11-02 NOTE — ED Notes (Signed)
Pt able to walk to bathroom by himself, no c/o SOB, gait steady.

## 2020-11-02 NOTE — Discharge Summary (Signed)
[name] ENI:[id number] DOB: Feb 19, 1961 DOA: 11/01/2020  PCP: Sofie Hartigan, MD  Admit date: 11/01/2020 Discharge date: 11/02/2020  Admitted From: Home Disposition: Home  Recommendations for Outpatient Follow-up:  1. Follow up with PCP in 1 week 2. Please obtain BMP/CBC in one week 3. Follow-up with cardiology in 1 week Dr. Rockey Situ     Discharge Condition:Stable CODE STATUS: Full Diet recommendation: Heart Healthy / Carb Modified  Brief/Interim Summary: Per IRW:ERXVQM T [name] is a 60 y.o. male with medical history significant for CAD (s/p cardiac cath and stenting >20 yrs ago per patient), COPD, HTN, HLD, T2DM, GERD, and AAA who presents to the ED for evaluation of chest pain he told me this a.m. it was more like chest tightness while he was standing.  Lasting 15 seconds.  No dyspnea on exertion or shortness of breath.  No radiation of his chest pain.While at work, his symptoms recurred and he felt lightheaded and as if he was going to pass out therefore he came to the ED for further evaluation.  He denies any associated nausea, vomiting, abdominal pain, dysuria, or lower extremity swelling.  His troponins were negative.  EKG was nonischemic.  Echocardiogram was done revealing normal EF and no wall motion abnormalities.  Please see full report below. He is stable to be discharged home.  I have contacted Dr. Rockey Situ cardiology who will have his office call patient next week for follow-up of stress test as outpatient.  Discussed this with the patient and he verbalizes an understanding.  Patient does have ongoing smoking and he was instructed and counseled to quit.  Discussed risk and complications of ongoing smoking with the patient.  Discharge Diagnoses:  Principal Problem:   Chest pain Active Problems:   Hypertension associated with diabetes ([place])   Type 2 diabetes mellitus ([place])   Hyperlipidemia associated with type 2 diabetes mellitus [facility])    Discharge  Instructions  Discharge Instructions    Call MD for:  severe uncontrolled pain   Complete by: As directed    Diet - low sodium heart healthy   Complete by: As directed    Diet Carb Modified   Complete by: As directed    Discharge instructions   Complete by: As directed    Follow up with cardiology Dr Rockey Situ  next week. If they dont call you , please contact them.   Increase activity slowly   Complete by: As directed    Increase activity slowly   Complete by: As directed      Allergies as of 11/02/2020   No Known Allergies     Medication List    STOP taking these medications   meloxicam 15 MG tablet Commonly known as: MOBIC     TAKE these medications   albuterol 108 (90 Base) MCG/ACT inhaler Commonly known as: VENTOLIN HFA Inhale 2 puffs into the lungs every 6 (six) hours as needed for wheezing or shortness of breath.   aspirin EC 325 MG tablet Take 1 tablet (325 mg total) by mouth daily.   atorvastatin 40 MG tablet Commonly known as: LIPITOR Take 40 mg by mouth daily.   esomeprazole 20 MG capsule Commonly known as: NEXIUM Take 20 mg by mouth every morning.   fenofibrate 145 MG tablet Commonly known as: TRICOR Take 145 mg by mouth every morning.   Ipratropium-Albuterol 20-100 MCG/ACT Aers respimat Commonly known as: COMBIVENT Inhale 1 puff into the lungs 4 (four) times daily as needed for wheezing.   lisinopril 10  MG tablet Commonly known as: ZESTRIL Take 10 mg by mouth every morning.   metFORMIN 500 MG tablet Commonly known as: GLUCOPHAGE Take 500 mg by mouth 2 (two) times daily with a meal.   Multi-Vitamins Tabs Take 1 tablet by mouth every morning.   pioglitazone 15 MG tablet Commonly known as: ACTOS Take 15 mg by mouth daily.   sildenafil 20 MG tablet Commonly known as: REVATIO Take 20-60 mg by mouth daily as needed.       Follow-up Information    Minna Merritts, MD Follow up in 3 day(s).   Specialty: Cardiology Why: needs stress test.  either with Dr. Rockey Situ or whoever can see by early next week. thanks Contact information: [address]  [place] [address] [phone]        Sofie Hartigan, MD Follow up in 1 week(s).   Specialty: Family Medicine Contact information: [address] [address] [phone]              No Known Allergies  Consultations:     Procedures/Studies: DG Chest 2 View  Result Date: 11/01/2020 CLINICAL DATA:  Shortness of breath and chest pain. EXAM: CHEST - 2 VIEW COMPARISON:  April 24, 2020 FINDINGS: Mild, stable linear scarring and/or atelectasis is seen within the bilateral lung bases. There is no evidence of a pleural effusion or pneumothorax. The heart size and mediastinal contours are within normal limits. The visualized skeletal structures are unremarkable. IMPRESSION: Mild, stable bibasilar linear scarring and/or atelectasis. Electronically Signed   By: Virgina Norfolk M.D.   On: 11/01/2020 22:15   CT Head Wo Contrast  Result Date: 11/01/2020 CLINICAL DATA:  Blurry vision.  Bilateral hand numbness. EXAM: CT HEAD WITHOUT CONTRAST TECHNIQUE: Contiguous axial images were obtained from the base of the skull through the vertex without intravenous contrast. COMPARISON:  None. FINDINGS: Brain: No intracranial hemorrhage, mass effect, or midline shift. No hydrocephalus. The basilar cisterns are patent. No evidence of territorial infarct or acute ischemia. No extra-axial or intracranial fluid collection. Vascular: Atherosclerosis of skullbase vasculature without hyperdense vessel or abnormal calcification. Skull: No fracture or focal lesion. Sinuses/Orbits: Paranasal sinuses and mastoid air cells are clear. The visualized orbits are unremarkable. Postsurgical change of the right maxillary sinus. Other: None. IMPRESSION: No acute intracranial abnormality. Electronically Signed   By: Keith Rake M.D.   On: 11/01/2020 22:23   ECHOCARDIOGRAM  COMPLETE  Result Date: 11/02/2020    ECHOCARDIOGRAM REPORT   Patient Name:   [name] Date of Exam: 11/02/2020 Medical Rec #:  [id number]         Height:       69.0 in Accession #:    [id number]        Weight:       224.0 lb Date of Birth:  1961-05-04         BSA:          2.168 m Patient Age:    60 years          BP:           129/83 mmHg Patient Gender: M                 HR:           77 bpm. Exam Location:  [facility] Procedure: 2D Echo, Color Doppler, Cardiac Doppler and Strain Analysis Indications:     R07.9 Chest Pain  History:  Patient has no prior history of Echocardiogram examinations.                  COPD; Risk Factors:Hypertension and Dyslipidemia.  Sonographer:     Charmayne Sheer RDCS (AE) Referring Phys:  [id number] Cleaster Corin PATEL Diagnosing Phys: Ida Rogue MD  Sonographer Comments: Suboptimal parasternal window and suboptimal subcostal window. Image acquisition challenging due to patient body habitus and Image acquisition challenging due to COPD. Global longitudinal strain was attempted. IMPRESSIONS  1. Left ventricular ejection fraction, by estimation, is 60 to 65%. The left ventricle has normal function. The left ventricle has no regional wall motion abnormalities. Left ventricular diastolic parameters are consistent with Grade I diastolic dysfunction (impaired relaxation). The average left ventricular global longitudinal strain is -15.1 %. The global longitudinal strain is normal.  2. Right ventricular systolic function is normal. The right ventricular size is normal. FINDINGS  Left Ventricle: Left ventricular ejection fraction, by estimation, is 60 to 65%. The left ventricle has normal function. The left ventricle has no regional wall motion abnormalities. The average left ventricular global longitudinal strain is -15.1 %. The global longitudinal strain is normal. The left ventricular internal cavity size was normal in size. There is no left ventricular hypertrophy. Left ventricular  diastolic parameters are consistent with Grade I diastolic dysfunction (impaired relaxation). Right Ventricle: The right ventricular size is normal. No increase in right ventricular wall thickness. Right ventricular systolic function is normal. Left Atrium: Left atrial size was normal in size. Right Atrium: Right atrial size was normal in size. Pericardium: There is no evidence of pericardial effusion. Mitral Valve: The mitral valve is normal in structure. No evidence of mitral valve regurgitation. No evidence of mitral valve stenosis. MV peak gradient, 2.4 mmHg. The mean mitral valve gradient is 1.0 mmHg. Tricuspid Valve: The tricuspid valve is normal in structure. Tricuspid valve regurgitation is not demonstrated. No evidence of tricuspid stenosis. Aortic Valve: The aortic valve was not well visualized. Aortic valve regurgitation is not visualized. No aortic stenosis is present. Aortic valve mean gradient measures 4.0 mmHg. Aortic valve peak gradient measures 7.6 mmHg. Aortic valve area, by VTI measures 2.48 cm. Pulmonic Valve: The pulmonic valve was normal in structure. Pulmonic valve regurgitation is not visualized. No evidence of pulmonic stenosis. Aorta: The aortic root is normal in size and structure. Venous: The inferior vena cava is normal in size with greater than 50% respiratory variability, suggesting right atrial pressure of 3 mmHg. IAS/Shunts: No atrial level shunt detected by color flow Doppler.  LEFT VENTRICLE PLAX 2D LVIDd:         5.50 cm     Diastology LVIDs:         4.20 cm     LV e' medial:    7.40 cm/s LV PW:         1.10 cm     LV E/e' medial:  9.2 LV IVS:        0.90 cm     LV e' lateral:   10.20 cm/s LVOT diam:     2.10 cm     LV E/e' lateral: 6.7 LV SV:         61 LV SV Index:   28          2D Longitudinal Strain LVOT Area:     3.46 cm    2D Strain GLS Avg:     -15.1 %  LV Volumes (MOD) LV vol d, MOD A2C: 59.0 ml LV vol d,  MOD A4C: 81.4 ml LV vol s, MOD A2C: 33.5 ml LV vol s, MOD A4C:  37.1 ml LV SV MOD A2C:     25.5 ml LV SV MOD A4C:     81.4 ml LV SV MOD BP:      35.1 ml RIGHT VENTRICLE RV Basal diam:  2.80 cm TAPSE (M-mode): 2.5 cm LEFT ATRIUM             Index       RIGHT ATRIUM          Index LA diam:        3.20 cm 1.48 cm/m  RA Area:     8.13 cm LA Vol (A2C):   25.6 ml 11.81 ml/m RA Volume:   15.50 ml 7.15 ml/m LA Vol (A4C):   31.9 ml 14.71 ml/m LA Biplane Vol: 29.0 ml 13.38 ml/m  AORTIC VALVE                   PULMONIC VALVE AV Area (Vmax):    2.45 cm    PV Vmax:       0.88 m/s AV Area (Vmean):   2.37 cm    PV Vmean:      61.400 cm/s AV Area (VTI):     2.48 cm    PV VTI:        0.196 m AV Vmax:           138.00 cm/s PV Peak grad:  3.1 mmHg AV Vmean:          94.800 cm/s PV Mean grad:  2.0 mmHg AV VTI:            0.246 m AV Peak Grad:      7.6 mmHg AV Mean Grad:      4.0 mmHg LVOT Vmax:         97.50 cm/s LVOT Vmean:        65.000 cm/s LVOT VTI:          0.176 m LVOT/AV VTI ratio: 0.72  AORTA Ao Root diam: 3.40 cm MITRAL VALVE MV Area (PHT): 3.16 cm    SHUNTS MV Area VTI:   2.90 cm    Systemic VTI:  0.18 m MV Peak grad:  2.4 mmHg    Systemic Diam: 2.10 cm MV Mean grad:  1.0 mmHg MV Vmax:       0.78 m/s MV Vmean:      52.2 cm/s MV Decel Time: 240 msec MV E velocity: 68.10 cm/s MV A velocity: 55.70 cm/s MV E/A ratio:  1.22 Ida Rogue MD Electronically signed by Ida Rogue MD Signature Date/Time: 11/02/2020/1:34:31 PM    Final        Subjective:  No chest pain or shortness of breath Discharge Exam: Vitals:   11/02/20 1050 11/02/20 1200  BP: (!) 142/81 133/64  Pulse: 75 70  Resp: 17 17  Temp:    SpO2: 97% 94%   Vitals:   11/02/20 0715 11/02/20 0758 11/02/20 1050 11/02/20 1200  BP:  129/83 (!) 142/81 133/64  Pulse: 68 75 75 70  Resp: 15 17 17 17   Temp:      TempSrc:      SpO2: 93% 96% 97% 94%  Weight:      Height:        General: Pt is alert, awake, not in acute distress Cardiovascular: RRR, S1/S2 +, no rubs, no gallops Respiratory: CTA  bilaterally, no wheezing, no rhonchi Abdominal: Soft, NT, ND, bowel sounds +  Extremities: no edema     The results of significant diagnostics from this hospitalization (including imaging, microbiology, ancillary and laboratory) are listed below for reference.     Microbiology: Recent Results (from the past 240 hour(s))  Resp Panel by RT-PCR (Flu A&B, Covid) Nasopharyngeal Swab     Status: None   Collection Time: 11/02/20 10:47 AM   Specimen: Nasopharyngeal Swab; Nasopharyngeal(NP) swabs in vial transport medium  Result Value Ref Range Status   SARS Coronavirus 2 by RT PCR NEGATIVE NEGATIVE Final    Comment: (NOTE) SARS-CoV-2 target nucleic acids are NOT DETECTED.  The SARS-CoV-2 RNA is generally detectable in upper respiratory specimens during the acute phase of infection. The lowest concentration of SARS-CoV-2 viral copies this assay can detect is 138 copies/mL. A negative result does not preclude SARS-Cov-2 infection and should not be used as the sole basis for treatment or other patient management decisions. A negative result may occur with  improper specimen collection/handling, submission of specimen other than nasopharyngeal swab, presence of viral mutation(s) within the areas targeted by this assay, and inadequate number of viral copies(<138 copies/mL). A negative result must be combined with clinical observations, patient history, and epidemiological information. The expected result is Negative.  Fact Sheet for Patients:  EntrepreneurPulse.com.au  Fact Sheet for Healthcare Providers:  IncredibleEmployment.be  This test is no t yet approved or cleared by the [place] FDA and  has been authorized for detection and/or diagnosis of SARS-CoV-2 by FDA under an Emergency Use Authorization (EUA). This EUA will remain  in effect (meaning this test can be used) for the duration of the COVID-19 declaration under Section 564(b)(1) of the  Act, 21 U.S.C.section 360bbb-3(b)(1), unless the authorization is terminated  or revoked sooner.       Influenza A by PCR NEGATIVE NEGATIVE Final   Influenza B by PCR NEGATIVE NEGATIVE Final    Comment: (NOTE) The Xpert Xpress SARS-CoV-2/FLU/RSV plus assay is intended as an aid in the diagnosis of influenza from Nasopharyngeal swab specimens and should not be used as a sole basis for treatment. Nasal washings and aspirates are unacceptable for Xpert Xpress SARS-CoV-2/FLU/RSV testing.  Fact Sheet for Patients: EntrepreneurPulse.com.au  Fact Sheet for Healthcare Providers: IncredibleEmployment.be  This test is not yet approved or cleared by the [place] FDA and has been authorized for detection and/or diagnosis of SARS-CoV-2 by FDA under an Emergency Use Authorization (EUA). This EUA will remain in effect (meaning this test can be used) for the duration of the COVID-19 declaration under Section 564(b)(1) of the Act, 21 U.S.C. section 360bbb-3(b)(1), unless the authorization is terminated or revoked.  Performed at [facility], [address] [place], [place] [address]      Labs: BNP (last 3 results) Recent Labs    11/01/20 2119  BNP 32.9   Basic Metabolic Panel: Recent Labs  Lab 11/01/20 2119  NA 141  K 4.3  CL 111  CO2 23  GLUCOSE 101*  BUN 16  CREATININE 1.19  CALCIUM 9.0   Liver Function Tests: Recent Labs  Lab 11/01/20 2119  AST 22  ALT 24  ALKPHOS 34*  BILITOT 0.7  PROT 6.7  ALBUMIN 3.6   Recent Labs  Lab 11/01/20 2119  LIPASE 36   No results for input(s): AMMONIA in the last 168 hours. CBC: Recent Labs  Lab 11/01/20 2119  WBC 9.9  NEUTROABS 4.9  HGB 15.9  HCT 47.1  MCV 95.2  PLT 272   Cardiac Enzymes: No results for  input(s): CKTOTAL, CKMB, CKMBINDEX, TROPONINI in the last 168 hours. BNP: Invalid input(s): POCBNP CBG: Recent Labs  Lab 11/01/20 2110 11/02/20 0802  11/02/20 1148  GLUCAP 93 89 91   D-Dimer Recent Labs    11/01/20 2119  DDIMER 0.47   Hgb A1c No results for input(s): HGBA1C in the last 72 hours. Lipid Profile No results for input(s): CHOL, HDL, LDLCALC, TRIG, CHOLHDL, LDLDIRECT in the last 72 hours. Thyroid function studies No results for input(s): TSH, T4TOTAL, T3FREE, THYROIDAB in the last 72 hours.  Invalid input(s): FREET3 Anemia work up No results for input(s): VITAMINB12, FOLATE, FERRITIN, TIBC, IRON, RETICCTPCT in the last 72 hours. Urinalysis    Component Value Date/Time   COLORURINE YELLOW (A) 12/23/2017 2347   APPEARANCEUR Clear 12/25/2017 1327   LABSPEC 1.021 12/23/2017 2347   LABSPEC 1.020 09/25/2012 1155   PHURINE 5.0 12/23/2017 2347   GLUCOSEU Negative 12/25/2017 1327   GLUCOSEU Negative 09/25/2012 1155   HGBUR LARGE (A) 12/23/2017 2347   BILIRUBINUR Negative 12/25/2017 1327   BILIRUBINUR Negative 09/25/2012 [place] 12/23/2017 2347   PROTEINUR Negative 12/25/2017 1327   PROTEINUR 30 (A) 12/23/2017 2347   NITRITE Negative 12/25/2017 1327   NITRITE NEGATIVE 12/23/2017 2347   LEUKOCYTESUR Negative 12/25/2017 1327   LEUKOCYTESUR Negative 09/25/2012 1155   Sepsis Labs Invalid input(s): PROCALCITONIN,  WBC,  LACTICIDVEN Microbiology Recent Results (from the past 240 hour(s))  Resp Panel by RT-PCR (Flu A&B, Covid) Nasopharyngeal Swab     Status: None   Collection Time: 11/02/20 10:47 AM   Specimen: Nasopharyngeal Swab; Nasopharyngeal(NP) swabs in vial transport medium  Result Value Ref Range Status   SARS Coronavirus 2 by RT PCR NEGATIVE NEGATIVE Final    Comment: (NOTE) SARS-CoV-2 target nucleic acids are NOT DETECTED.  The SARS-CoV-2 RNA is generally detectable in upper respiratory specimens during the acute phase of infection. The lowest concentration of SARS-CoV-2 viral copies this assay can detect is 138 copies/mL. A negative result does not preclude SARS-Cov-2 infection and  should not be used as the sole basis for treatment or other patient management decisions. A negative result may occur with  improper specimen collection/handling, submission of specimen other than nasopharyngeal swab, presence of viral mutation(s) within the areas targeted by this assay, and inadequate number of viral copies(<138 copies/mL). A negative result must be combined with clinical observations, patient history, and epidemiological information. The expected result is Negative.  Fact Sheet for Patients:  EntrepreneurPulse.com.au  Fact Sheet for Healthcare Providers:  IncredibleEmployment.be  This test is no t yet approved or cleared by the [place] FDA and  has been authorized for detection and/or diagnosis of SARS-CoV-2 by FDA under an Emergency Use Authorization (EUA). This EUA will remain  in effect (meaning this test can be used) for the duration of the COVID-19 declaration under Section 564(b)(1) of the Act, 21 U.S.C.section 360bbb-3(b)(1), unless the authorization is terminated  or revoked sooner.       Influenza A by PCR NEGATIVE NEGATIVE Final   Influenza B by PCR NEGATIVE NEGATIVE Final    Comment: (NOTE) The Xpert Xpress SARS-CoV-2/FLU/RSV plus assay is intended as an aid in the diagnosis of influenza from Nasopharyngeal swab specimens and should not be used as a sole basis for treatment. Nasal washings and aspirates are unacceptable for Xpert Xpress SARS-CoV-2/FLU/RSV testing.  Fact Sheet for Patients: EntrepreneurPulse.com.au  Fact Sheet for Healthcare Providers: IncredibleEmployment.be  This test is not yet approved or cleared by the [place] FDA  and has been authorized for detection and/or diagnosis of SARS-CoV-2 by FDA under an Emergency Use Authorization (EUA). This EUA will remain in effect (meaning this test can be used) for the duration of the COVID-19 declaration  under Section 564(b)(1) of the Act, 21 U.S.C. section 360bbb-3(b)(1), unless the authorization is terminated or revoked.  Performed at [facility], [address], [place], [place] [address]      Time coordinating discharge: Over 30 minutes  SIGNED:   Nolberto Hanlon, MD  [facility] 11/02/2020, 2:19 PM Pager   If 7PM-7AM, please contact night-coverage [email or web address] Password [email or web address]

## 2020-11-02 NOTE — ED Provider Notes (Signed)
[facility] Emergency Department Provider Note   ____________________________________________   Event Date/Time   First MD Initiated Contact with Patient 11/02/20 [phone]     (approximate)  I have reviewed the triage vital signs and the nursing notes.   HISTORY  Chief Complaint Shortness of Breath and Numbness    HPI A 60 year old patient with a history of hypertension, hypercholesterolemia and obesity presents for evaluation of chest pain. Initial onset of pain was less than one hour ago. The patient's chest pain is described as heaviness/pressure/tightness and is worse with exertion. The patient's chest pain is middle- or left-sided, is not well-localized, is not sharp and does radiate to the arms/jaw/neck. The patient does not complain of nausea and denies diaphoresis. The patient has smoked in the past 90 days. The patient has no history of stroke, has no history of peripheral artery disease, denies any history of treated diabetes and has no relevant family history of coronary artery disease (first degree relative at less than age 45).   Also reports that he was at work, started to feel little lightheaded, began having a substernal chest pressure numb feeling in his left upper arm.  He started to feel lightheaded as though his vision was straining or blurry, then started feeling short of breath and felt tingling in both of his hands.  Most of the symptoms have improved now, and his chest discomfort he reports is gone away.  However for the last 2 days he notices whenever he exerts himself he will start feeling pain in his left chest and a tingling feeling in his left arm that goes away with resting after about 15 minutes  Reports he had a cardiac stent placed maybe 20 years ago up in [place].  Does smoke.  Has a history of abdominal aneurysm that is followed by vascular surgery.  No abdominal pain or back pain.  No ripping tearing or moving pain    Past Medical  History:  Diagnosis Date  . AAA (abdominal aortic aneurysm) ([place])   . Acid reflux 12/01/2013  . Arthritis   . 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 Calculus of kidney 03/14/2015  . 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 History of kidney stones   . HLD (hyperlipidemia) 12/01/2013  . Hypertension   . Renal stones     Patient Active Problem List   Diagnosis Date Noted  . Chest pain 11/01/2020  . Type 2 diabetes mellitus ([place]) 11/01/2020  . Hyperlipidemia associated with type 2 diabetes mellitus ([place]) 11/01/2020  . AAA (abdominal aortic aneurysm) without rupture ([place]) 05/13/2016  . PVD (peripheral vascular disease) ([place]) 04/10/2016  . Hypertension associated with diabetes ([place]) 03/14/2015  . Calculus of kidney 03/14/2015  . 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 Acid reflux 12/01/2013  . HLD (hyperlipidemia) 12/01/2013    Past Surgical History:  Procedure Laterality Date  . APPENDECTOMY    . CARDIAC CATHETERIZATION    . CORONARY ANGIOPLASTY  2004?   [place], [place]  . CYSTOSCOPY W/ URETERAL STENT PLACEMENT Left 01/28/2015   Procedure: CYSTOSCOPY WITH RETROGRADE PYELOGRAM/URETERAL STENT PLACEMENT;  Surgeon: Alexis Frock, MD;  Location: [facility] ORS;  Service: Urology;  Laterality: Left;  . CYSTOSCOPY/URETEROSCOPY/HOLMIUM LASER/STENT PLACEMENT Left 02/08/2015   Procedure: CYSTOSCOPY/URETEROSCOPY/HOLMIUM LASER/STENT PLACEMENT;  Surgeon: Hollice Espy, MD;  Location: [facility] ORS;  Service: Urology;  Laterality: Left;  . CYSTOSCOPY/URETEROSCOPY/HOLMIUM LASER/STENT PLACEMENT Left 01/07/2018   Procedure: CYSTOSCOPY/URETEROSCOPY/HOLMIUM LASER/STENT PLACEMENT;  Surgeon: Hollice Espy, MD;  Location: [facility] ORS;  Service: Urology;  Laterality: Left;  . FACIAL COSMETIC SURGERY     21 fractures in left cheek and face from baseball  . LITHOTRIPSY    . VASECTOMY      Prior to Admission medications   Medication Sig Start Date End Date Taking? Authorizing Provider   albuterol (VENTOLIN HFA) 108 (90 Base) MCG/ACT inhaler Inhale 2 puffs into the lungs every 6 (six) hours as needed for wheezing or shortness of breath. 08/27/19  Yes Fisher, Linden Dolin, PA-C  aspirin EC 325 MG tablet Take 1 tablet (325 mg total) by mouth daily. 05/04/19  Yes Carrie Mew, MD  atorvastatin (LIPITOR) 40 MG tablet Take 40 mg by mouth daily. 02/03/18  Yes [provider]  esomeprazole (NEXIUM) 20 MG capsule Take 20 mg by mouth every morning.   Yes [provider]  fenofibrate (TRICOR) 145 MG tablet Take 145 mg by mouth every morning.  01/17/15  Yes [provider]  Ipratropium-Albuterol (COMBIVENT) 20-100 MCG/ACT AERS respimat Inhale 1 puff into the lungs 4 (four) times daily as needed for wheezing.  03/25/14  Yes [provider]  lisinopril (PRINIVIL,ZESTRIL) 10 MG tablet Take 10 mg by mouth every morning.    Yes [provider]  meloxicam (MOBIC) 15 MG tablet Take 15 mg by mouth every morning.  12/05/17  Yes [provider]  metFORMIN (GLUCOPHAGE) 500 MG tablet Take 500 mg by mouth 2 (two) times daily with a meal. 12/03/17  Yes [provider]  Multiple Vitamin (MULTI-VITAMINS) TABS Take 1 tablet by mouth every morning.    Yes [provider]  pioglitazone (ACTOS) 15 MG tablet Take 15 mg by mouth daily. 10/31/20  Yes [provider]  sildenafil (REVATIO) 20 MG tablet Take 20-60 mg by mouth daily as needed. 10/21/20  Yes [provider]    Allergies Patient has no known allergies.  Family History  Problem Relation Age of Onset  . Diabetes Mother   . Diabetes Maternal Uncle   . Cancer Maternal Uncle   . Cancer Maternal Uncle   . Prostate cancer Neg Hx   . Bladder Cancer Neg Hx   . Kidney cancer Neg Hx     Social History Social History   Tobacco Use  . Smoking status: Current Every Day Smoker    Packs/day: 10.00    Years: 30.00    Pack years: 300.00    Types: Cigarettes  . Smokeless  tobacco: Never Used  Vaping Use  . Vaping Use: Never used  Substance Use Topics  . Alcohol use: No    Alcohol/week: 0.0 standard drinks  . Drug use: No    Review of Systems Constitutional: No fever/chills Eyes: Felt of blurriness of vision almost like a "cloudiness" over his eyes ENT: No sore throat. Cardiovascular: See HPI Respiratory: See HPI.  Does report having just a slight amount of wheezing, but reports that not unusual he usually just takes his inhaler, feels like mild "COPD" that he gets almost daily Gastrointestinal: No abdominal pain.   Genitourinary: Negative for dysuria. Musculoskeletal: Negative for back pain. Skin: Negative for rash. Neurological: Negative for headaches, areas of focal weakness or numbness except numb and tingly feeling that occurred in both arms that is now gone away, and then also when he gets the chest pain he will feel a need tingly feeling in his left upper arm, currently not having chest pain.    ____________________________________________   PHYSICAL EXAM:  VITAL SIGNS: ED  Triage Vitals  Enc Vitals Group     BP 11/01/20 2057 (!) 157/83     Pulse Rate 11/01/20 2057 92     Resp 11/01/20 2057 20     Temp 11/01/20 2057 97.7 F (36.5 C)     Temp Source 11/01/20 2057 Oral     SpO2 11/01/20 2057 97 %     Weight 11/01/20 2057 224 lb (101.6 kg)     Height 11/01/20 2057 5\' 9"  (1.753 m)     Head Circumference --      Peak Flow --      Pain Score 11/01/20 2106 2     Pain Loc --      Pain Edu? --      Excl. in [place]? --     Constitutional: Alert and oriented. Well appearing and in no acute distress. Eyes: Conjunctivae are normal. Head: Atraumatic. Nose: No congestion/rhinnorhea. Mouth/Throat: Mucous membranes are moist. Neck: No stridor.  Cardiovascular: Normal rate, regular rhythm. Grossly normal heart sounds.  Good peripheral circulation. Respiratory: Normal respiratory effort.  No retractions. Lungs CTAB except for some very scant  wheezing in the bases bilateral. Gastrointestinal: Soft and nontender. No distention. Musculoskeletal: No lower extremity tenderness nor edema. Neurologic:  Normal speech and language. No gross focal neurologic deficits are appreciated.  Normal cranial nerve exam.  No pronator drift in any extremity.  5-5 strength all extremities.  Extraocular movements are normal.  Visual fields are normal bilateral.  Clear speech.  No evidence of acute neurologic deficit Skin:  Skin is warm, dry and intact. No rash noted. Psychiatric: Mood and affect are normal. Speech and behavior are normal.  ____________________________________________   LABS (all labs ordered are listed, but only abnormal results are displayed)  Labs Reviewed  COMPREHENSIVE METABOLIC PANEL - Abnormal; Notable for the following components:      Result Value   Glucose, Bld 101 (*)    Alkaline Phosphatase 34 (*)    All other components within normal limits  CBC WITH DIFFERENTIAL/PLATELET - Abnormal; Notable for the following components:   Eosinophils Absolute 0.6 (*)    All other components within normal limits  LIPASE, BLOOD  BRAIN NATRIURETIC PEPTIDE  PROTIME-INR  APTT  D-DIMER, QUANTITATIVE  HIV ANTIBODY (ROUTINE TESTING W REFLEX)  CBG MONITORING, ED  CBG MONITORING, ED  TROPONIN I (HIGH SENSITIVITY)  TROPONIN I (HIGH SENSITIVITY)   ____________________________________________  EKG  Reviewed interpreted by me at 2100 Heart rate 99 QRS 89 QTc 430 Normal sinus rhythm, possible old anterior infarct.  I see no evidence of acute cardiac ischemia ____________________________________________  RADIOLOGY  DG Chest 2 View  Result Date: 11/01/2020 CLINICAL DATA:  Shortness of breath and chest pain. EXAM: CHEST - 2 VIEW COMPARISON:  April 24, 2020 FINDINGS: Mild, stable linear scarring and/or atelectasis is seen within the bilateral lung bases. There is no evidence of a pleural effusion or pneumothorax. The heart size and  mediastinal contours are within normal limits. The visualized skeletal structures are unremarkable. IMPRESSION: Mild, stable bibasilar linear scarring and/or atelectasis. Electronically Signed   By: Virgina Norfolk M.D.   On: 11/01/2020 22:15   CT Head Wo Contrast  Result Date: 11/01/2020 CLINICAL DATA:  Blurry vision.  Bilateral hand numbness. EXAM: CT HEAD WITHOUT CONTRAST TECHNIQUE: Contiguous axial images were obtained from the base of the skull through the vertex without intravenous contrast. COMPARISON:  None. FINDINGS: Brain: No intracranial hemorrhage, mass effect, or midline shift. No hydrocephalus. The basilar cisterns are patent. No  evidence of territorial infarct or acute ischemia. No extra-axial or intracranial fluid collection. Vascular: Atherosclerosis of skullbase vasculature without hyperdense vessel or abnormal calcification. Skull: No fracture or focal lesion. Sinuses/Orbits: Paranasal sinuses and mastoid air cells are clear. The visualized orbits are unremarkable. Postsurgical change of the right maxillary sinus. Other: None. IMPRESSION: No acute intracranial abnormality. Electronically Signed   By: Keith Rake M.D.   On: 11/01/2020 22:23    Chest x-ray reviewed stable linear scarring.  No obvious acute finding  CT head negative for acute finding.  CT the head was performed as the patient did have concerns of paresthesias and visual disturbance used to rule out small intracranial hemorrhage, or felt far less likely a stroke but given his symptomatology and bilateral nature paresthesias I felt this to be unlikely.  No evidence of small infarct or brainstem hemorrhage ____________________________________________   PROCEDURES  Procedure(s) performed:   Procedures  Critical Care performed: No  ____________________________________________   INITIAL IMPRESSION / ASSESSMENT AND PLAN / ED COURSE  Pertinent labs & imaging results that were available during my care of the  patient were reviewed by me and considered in my medical decision making (see chart for details).   Differential diagnosis includes, but is not limited to, ACS, aortic dissection, pulmonary embolism, cardiac tamponade, pneumothorax, pneumonia, pericarditis, myocarditis, GI-related causes including esophagitis/gastritis, and musculoskeletal chest wall pain.    Patient had other accompanying symptoms including lightheadedness near syncopal feeling bilateral hand paresthesias etc., but these seem to be resolved.  My primary concern after discussing with him is that he has been having exertional chest pain and paresthesias for about 2 days now.  In the setting of his multiple coronary risk factors I do find this to be concerning and could represent an unstable angina.  No evidence of a acute STEMI or NSTEMI at this point.  Chest pain and discomfort is fully relieved in the ER at this time but does come about when he does short things like walking between stretcher in CT scanner he reports that caused a brief episode of discomfort as well.  Now once again asymptomatic.  Discussed with the patient, discussed treatment options and patient agreeable to staying for observation for further evaluation of chest discomfort.      ____________________________________________   FINAL CLINICAL IMPRESSION(S) / ED DIAGNOSES  Final diagnoses:  Chest pain, moderate coronary artery risk        Note:  This document was prepared using Dragon voice recognition software and may include unintentional dictation errors       Delman Kitten, MD 11/02/20 [phone]

## 2020-11-02 NOTE — Progress Notes (Signed)
*  PRELIMINARY RESULTS* Echocardiogram 2D Echocardiogram has been performed.  [name] 11/02/2020, 11:27 AM

## 2020-11-02 NOTE — Progress Notes (Signed)
Jodi Mourning to be D/C'd Home per MD order.  Discussed prescriptions and follow up appointments with the patient.Mmedication list explained in detail. Pt verbalized understanding.  Allergies as of 11/02/2020   No Known Allergies     Medication List    STOP taking these medications   meloxicam 15 MG tablet Commonly known as: MOBIC     TAKE these medications   albuterol 108 (90 Base) MCG/ACT inhaler Commonly known as: VENTOLIN HFA Inhale 2 puffs into the lungs every 6 (six) hours as needed for wheezing or shortness of breath.   aspirin EC 325 MG tablet Take 1 tablet (325 mg total) by mouth daily.   atorvastatin 40 MG tablet Commonly known as: LIPITOR Take 40 mg by mouth daily.   esomeprazole 20 MG capsule Commonly known as: NEXIUM Take 20 mg by mouth every morning.   fenofibrate 145 MG tablet Commonly known as: TRICOR Take 145 mg by mouth every morning.   Ipratropium-Albuterol 20-100 MCG/ACT Aers respimat Commonly known as: COMBIVENT Inhale 1 puff into the lungs 4 (four) times daily as needed for wheezing.   lisinopril 10 MG tablet Commonly known as: ZESTRIL Take 10 mg by mouth every morning.   metFORMIN 500 MG tablet Commonly known as: GLUCOPHAGE Take 500 mg by mouth 2 (two) times daily with a meal.   Multi-Vitamins Tabs Take 1 tablet by mouth every morning.   pioglitazone 15 MG tablet Commonly known as: ACTOS Take 15 mg by mouth daily.   sildenafil 20 MG tablet Commonly known as: REVATIO Take 20-60 mg by mouth daily as needed.       Vitals:   11/02/20 1200 11/02/20 1439  BP: 133/64 (!) 149/92  Pulse: 70 75  Resp: 17 18  Temp:  97.7 F (36.5 C)  SpO2: 94% 99%     IV catheter discontinued intact. Site without signs and symptoms of complications. Dressing and pressure applied. Pt denies pain at this time. No complaints noted.  An After Visit Summary was printed and given to the patient. Patient escorted via [place], and D/C home via private  auto.  Rolley Sims

## 2020-11-13 ENCOUNTER — Telehealth: Payer: Self-pay

## 2020-11-13 NOTE — Telephone Encounter (Signed)
-----   Message from Minna Merritts, MD sent at 11/02/2020  2:05 PM EDT ----- Received request from hospitalist for outpatient clinic appointment any provider chest pain New patient visit Leaving the hospital April 14 Thx TG

## 2020-11-13 NOTE — Telephone Encounter (Signed)
lmov

## 2020-11-22 NOTE — Telephone Encounter (Signed)
Attempted to schedule.  LMOV to call office.  ° °

## 2020-11-30 NOTE — Telephone Encounter (Signed)
Attempted to schedule.  LMOV to call office.  ° °

## 2020-12-25 ENCOUNTER — Ambulatory Visit
Admission: EM | Admit: 2020-12-25 | Discharge: 2020-12-25 | Disposition: A | Discharge status: Home / Self Care | Payer: BC Managed Care – PPO | Attending: Physician Assistant | Admitting: Physician Assistant

## 2020-12-25 ENCOUNTER — Encounter: Payer: Self-pay | Admitting: Emergency Medicine

## 2020-12-25 ENCOUNTER — Other Ambulatory Visit: Payer: Self-pay

## 2020-12-25 DIAGNOSIS — R059 Cough, unspecified: Secondary | ICD-10-CM | POA: Insufficient documentation

## 2020-12-25 DIAGNOSIS — Z28310 Unvaccinated for covid-19: Secondary | ICD-10-CM | POA: Diagnosis not present

## 2020-12-25 DIAGNOSIS — Z7984 Long term (current) use of oral hypoglycemic drugs: Secondary | ICD-10-CM | POA: Insufficient documentation

## 2020-12-25 DIAGNOSIS — F17210 Nicotine dependence, cigarettes, uncomplicated: Secondary | ICD-10-CM | POA: Diagnosis not present

## 2020-12-25 DIAGNOSIS — I10 Essential (primary) hypertension: Secondary | ICD-10-CM | POA: Diagnosis not present

## 2020-12-25 DIAGNOSIS — J441 Chronic obstructive pulmonary disease with (acute) exacerbation: Secondary | ICD-10-CM | POA: Diagnosis not present

## 2020-12-25 DIAGNOSIS — Z79899 Other long term (current) drug therapy: Secondary | ICD-10-CM | POA: Diagnosis not present

## 2020-12-25 DIAGNOSIS — Z20822 Contact with and (suspected) exposure to covid-19: Secondary | ICD-10-CM | POA: Diagnosis not present

## 2020-12-25 DIAGNOSIS — Z7982 Long term (current) use of aspirin: Secondary | ICD-10-CM | POA: Diagnosis not present

## 2020-12-25 DIAGNOSIS — R509 Fever, unspecified: Secondary | ICD-10-CM

## 2020-12-25 DIAGNOSIS — E119 Type 2 diabetes mellitus without complications: Secondary | ICD-10-CM | POA: Insufficient documentation

## 2020-12-25 DIAGNOSIS — B349 Viral infection, unspecified: Secondary | ICD-10-CM | POA: Insufficient documentation

## 2020-12-25 LAB — INFLUENZA A AND B ANTIGEN (CONVERTED LAB)
INFLUENZA A ANTIGEN, POC: NEGATIVE
INFLUENZA B ANTIGEN, POC: NEGATIVE

## 2020-12-25 MED ORDER — AZITHROMYCIN 250 MG PO TABS: 250.0000 mg | ORAL_TABLET | Freq: Every day | ORAL | 0 refills | Status: DC

## 2020-12-25 MED ORDER — PREDNISONE 20 MG PO TABS: 40.0000 mg | ORAL_TABLET | Freq: Every day | ORAL | 0 refills | Status: AC

## 2020-12-25 MED ORDER — BENZONATATE 100 MG PO CAPS: 200.0000 mg | ORAL_CAPSULE | Freq: Three times a day (TID) | ORAL | 0 refills | Status: AC | PRN

## 2020-12-25 NOTE — ED Triage Notes (Signed)
Patient c/o cough, headache, fatigue, and fever that started yesterday. He says he can't stop coughing.

## 2020-12-25 NOTE — ED Provider Notes (Signed)
[facility]    CSN: [id number] Arrival date & time: 12/25/20  1922      History   Chief Complaint Chief Complaint  Patient presents with  . Cough  . Fever  . Headache    HPI [name] is a 60 y.o. male presenting for cute onset of fever up to 101 degrees, fatigue, body aches, productive cough, congestion and scratchy throat yesterday.  He said he has been taking an over-the-counter decongestant for his cough.  He says that his boss recently had COVID-19 and came back to work wearing a mask.  Patient is not vaccinated for COVID-19.  Admits personal history of COVID-19 about a year ago.  Patient has a history of COPD and hypertension.  Also has a history of AAA.  Patient denies any chest pain, wheezing or shortness of breath.  No nausea/vomiting or diarrhea.  Patient says uses an albuterol inhaler as needed for shortness of breath related to his COPD but has not needed to use in the past day.  He has no other complaints or concerns.  HPI  Past Medical History:  Diagnosis Date  . AAA (abdominal aortic aneurysm) ([place])   . Acid reflux 12/01/2013  . Arthritis   . 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 Calculus of kidney 03/14/2015  . COPD, mild ([place]) 03/25/2014  . History of kidney stones   . HLD (hyperlipidemia) 12/01/2013  . Hypertension   . Renal stones     Patient Active Problem List   Diagnosis Date Noted  . Chest pain 11/01/2020  . Type 2 diabetes mellitus ([place]) 11/01/2020  . Hyperlipidemia associated with type 2 diabetes mellitus ([place]) 11/01/2020  . AAA (abdominal aortic aneurysm) without rupture ([place]) 05/13/2016  . PVD (peripheral vascular disease) ([place]) 04/10/2016  . Hypertension associated with diabetes ([place]) 03/14/2015  . Calculus of kidney 03/14/2015  . COPD, mild ([place]) 03/25/2014  . 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 Acid reflux 12/01/2013  . HLD (hyperlipidemia) 12/01/2013    Past Surgical History:  Procedure Laterality Date  .  APPENDECTOMY    . CARDIAC CATHETERIZATION    . CORONARY ANGIOPLASTY  2004?   [place], [place]  . CYSTOSCOPY W/ URETERAL STENT PLACEMENT Left 01/28/2015   Procedure: CYSTOSCOPY WITH RETROGRADE PYELOGRAM/URETERAL STENT PLACEMENT;  Surgeon: Alexis Frock, MD;  Location: [facility];  Service: Urology;  Laterality: Left;  . CYSTOSCOPY/URETEROSCOPY/HOLMIUM LASER/STENT PLACEMENT Left 02/08/2015   Procedure: CYSTOSCOPY/URETEROSCOPY/HOLMIUM LASER/STENT PLACEMENT;  Surgeon: Hollice Espy, MD;  Location: [facility];  Service: Urology;  Laterality: Left;  . CYSTOSCOPY/URETEROSCOPY/HOLMIUM LASER/STENT PLACEMENT Left 01/07/2018   Procedure: CYSTOSCOPY/URETEROSCOPY/HOLMIUM LASER/STENT PLACEMENT;  Surgeon: Hollice Espy, MD;  Location: [facility];  Service: Urology;  Laterality: Left;  . FACIAL COSMETIC SURGERY     21 fractures in left cheek and face from baseball  . LITHOTRIPSY    . VASECTOMY         Home Medications    Prior to Admission medications   Medication Sig Start Date End Date Taking? Authorizing Provider  albuterol (VENTOLIN HFA) 108 (90 Base) MCG/ACT inhaler Inhale 2 puffs into the lungs every 6 (six) hours as needed for wheezing or shortness of breath. 08/27/19  Yes Fisher, Linden Dolin, PA-C  aspirin EC 325 MG tablet Take 1 tablet (325 mg total) by mouth daily. 05/04/19  Yes Carrie Mew, MD  atorvastatin (LIPITOR) 40 MG tablet Take 40 mg by mouth daily. 02/03/18  Yes [provider]  azithromycin (ZITHROMAX) 250 MG tablet Take 1 tablet (  250 mg total) by mouth daily. Take first 2 tablets together, then 1 every day until finished. 12/25/20  Yes Danton Clap, PA-C  benzonatate (TESSALON) 100 MG capsule Take 2 capsules (200 mg total) by mouth 3 (three) times daily as needed for up to 7 days for cough. 12/25/20 01/01/21 Yes Danton Clap, PA-C  esomeprazole (NEXIUM) 20 MG capsule Take 20 mg by mouth every morning.   Yes [provider]  fenofibrate (TRICOR) 145 MG tablet Take 145 mg by  mouth every morning.  01/17/15  Yes [provider]  Ipratropium-Albuterol (COMBIVENT) 20-100 MCG/ACT AERS respimat Inhale 1 puff into the lungs 4 (four) times daily as needed for wheezing.  03/25/14  Yes [provider]  lisinopril (PRINIVIL,ZESTRIL) 10 MG tablet Take 10 mg by mouth every morning.    Yes [provider]  metFORMIN (GLUCOPHAGE) 500 MG tablet Take 500 mg by mouth 2 (two) times daily with a meal. 12/03/17  Yes [provider]  Multiple Vitamin (MULTI-VITAMINS) TABS Take 1 tablet by mouth every morning.    Yes [provider]  pioglitazone (ACTOS) 15 MG tablet Take 15 mg by mouth daily. 10/31/20  Yes [provider]  predniSONE (DELTASONE) 20 MG tablet Take 2 tablets (40 mg total) by mouth daily for 5 days. 12/25/20 12/30/20 Yes Laurene Footman B, PA-C  sildenafil (REVATIO) 20 MG tablet Take 20-60 mg by mouth daily as needed. 10/21/20  Yes [provider]    Family History Family History  Problem Relation Age of Onset  . Diabetes Mother   . Diabetes Maternal Uncle   . Cancer Maternal Uncle   . Cancer Maternal Uncle   . Prostate cancer Neg Hx   . Bladder Cancer Neg Hx   . Kidney cancer Neg Hx     Social History Social History   Tobacco Use  . Smoking status: Current Every Day Smoker    Packs/day: 10.00    Years: 30.00    Pack years: 300.00    Types: Cigarettes  . Smokeless tobacco: Never Used  Vaping Use  . Vaping Use: Never used  Substance Use Topics  . Alcohol use: No    Alcohol/week: 0.0 standard drinks  . Drug use: No     Allergies   Patient has no known allergies.   Review of Systems Review of Systems  Constitutional: Positive for fatigue and fever.  HENT: Positive for congestion, rhinorrhea and sore throat. Negative for sinus pressure.   Respiratory: Positive for cough. Negative for shortness of breath.   Cardiovascular: Negative for chest pain.  Gastrointestinal: Negative for abdominal pain,  diarrhea, nausea and vomiting.  Musculoskeletal: Positive for myalgias.  Neurological: Positive for headaches. Negative for weakness and light-headedness.     Physical Exam Triage Vital Signs ED Triage Vitals [12/25/20 1944]  Enc Vitals Group     BP      Pulse      Resp      Temp      Temp src      SpO2      Weight 225 lb (102.1 kg)     Height 5\' 9"  (1.753 m)     Head Circumference      Peak Flow      Pain Score 5     Pain Loc      Pain Edu?      Excl. in [place]?    No data found.  Updated Vital Signs BP 137/86 (BP Location: Left Arm)  Pulse (!) 117   Temp 98.2 F (36.8 C) (Oral)   Resp 18   Ht 5\' 9"  (1.753 m)   Wt 225 lb (102.1 kg)   SpO2 98%   BMI 33.23 kg/m       Physical Exam Vitals and nursing note reviewed.  Constitutional:      General: He is not in acute distress.    Appearance: Normal appearance. He is well-developed. He is obese. He is ill-appearing. He is not toxic-appearing or diaphoretic.  HENT:     Head: Normocephalic and atraumatic.     Nose: Congestion and rhinorrhea present.     Mouth/Throat:     Mouth: Mucous membranes are moist.     Pharynx: Oropharynx is clear. Uvula midline. Posterior oropharyngeal erythema present.     Tonsils: No tonsillar abscesses.  Eyes:     General: No scleral icterus.       Right eye: No discharge.        Left eye: No discharge.     Conjunctiva/sclera: Conjunctivae normal.  Neck:     Thyroid: No thyromegaly.     Trachea: No tracheal deviation.  Cardiovascular:     Rate and Rhythm: Regular rhythm. Tachycardia present.     Heart sounds: Normal heart sounds.  Pulmonary:     Effort: Pulmonary effort is normal. No respiratory distress.     Breath sounds: Normal breath sounds. No wheezing, rhonchi or rales.  Musculoskeletal:     Cervical back: Neck supple.  Lymphadenopathy:     Cervical: No cervical adenopathy.  Skin:    General: Skin is dry.     Findings: No rash.  Neurological:     General: No focal  deficit present.     Mental Status: He is alert. Mental status is at baseline.     Motor: No weakness.     Gait: Gait normal.  Psychiatric:        Mood and Affect: Mood normal.        Behavior: Behavior normal.        Thought Content: Thought content normal.      UC Treatments / Results  Labs (all labs ordered are listed, but only abnormal results are displayed) Labs Reviewed  SARS CORONAVIRUS 2 (TAT 6-24 HRS)  INFLUENZA A AND B ANTIGEN (CONVERTED LAB)  POC INFLUENZA A AND B ANTIGEN (URGENT CARE ONLY)    EKG   Radiology No results found.  Procedures Procedures (including critical care time)  Medications Ordered in UC Medications - No data to display  Initial Impression / Assessment and Plan / UC Course  I have reviewed the triage vital signs and the nursing notes.  Pertinent labs & imaging results that were available during my care of the patient were reviewed by me and considered in my medical decision making (see chart for details).   60 year old male with COPD presenting for onset of fever, fatigue, achiness, cough, headache and sore throat that started yesterday.  Vitals are all stable in the clinic today.  He is afebrile and oxygen is 98%.  He is overall well-appearing.  He does have congestion on exam and mild posterior pharyngeal erythema.  Chest is clear to auscultation heart regular rate and rhythm.  Rapid flu test is negative.  PCR COVID test sent.  Current CDC guidelines, isolation protocol and ED precautions reviewed with patient.  Advised him if he is positive for COVID that he would be a good candidate for antibody or antiviral therapy and someone will  discuss that with him further if he is positive.   Suspect viral illness.  Treating him for COPD exacerbation with prednisone and azithromycin.  Also sent in benzonatate.  He says that he has taken this medication before and it helped him with his cough previously.  Advised increased rest and fluids.  Advised  to follow-up with [place] as needed or PCP.  Advised to go to ED for any severe acute worsening of symptoms.  Work note given.  Final Clinical Impressions(s) / UC Diagnoses   Final diagnoses:  COPD exacerbation (HCC)  Cough  Fever, unspecified  Viral illness     Discharge Instructions     Flu test is negative.  COVID test will be back tomorrow.  I have sent in azithromycin which is an antibiotic and prednisone to help treat a mild exacerbation of your COPD.  This is likely a viral illness and COVID-19 is not ruled out.  See more information guarding COVID-19 below.  If you do test positive you will be a good candidate for antiviral or antibody therapy and someone will contact you regarding that sometime tomorrow.  You should stay home until you get result.  You have received COVID testing today either for positive exposure, concerning symptoms that could be related to COVID infection, screening purposes, or re-testing after confirmed positive.  Your test obtained today checks for active viral infection in the last 1-2 weeks. If your test is negative now, you can still test positive later. So, if you do develop symptoms you should either get re-tested and/or isolate x 5 days and then strict mask use x 5 days (unvaccinated) or mask use x 10 days (vaccinated). Please follow CDC guidelines.  While Rapid antigen tests come back in 15-20 minutes, send out PCR/molecular test results typically come back within 1-3 days. In the mean time, if you are symptomatic, assume this could be a positive test and treat/monitor yourself as if you do have COVID.   We will call with test results if positive. Please download the MyChart app and set up a profile to access test results.   If symptomatic, go home and rest. Push fluids. Take Tylenol as needed for discomfort. Gargle warm salt water. Throat lozenges. Take Mucinex DM or Robitussin for cough. Humidifier in bedroom to ease coughing. Warm showers. Also review the  COVID handout for more information.  COVID-19 INFECTION: The incubation period of COVID-19 is approximately 14 days after exposure, with most symptoms developing in roughly 4-5 days. Symptoms may range in severity from mild to critically severe. Roughly 80% of those infected will have mild symptoms. People of any age may become infected with COVID-19 and have the ability to transmit the virus. The most common symptoms include: fever, fatigue, cough, body aches, headaches, sore throat, nasal congestion, shortness of breath, nausea, vomiting, diarrhea, changes in smell and/or taste.    COURSE OF ILLNESS Some patients may begin with mild disease which can progress quickly into critical symptoms. If your symptoms are worsening please call ahead to the Emergency Department and proceed there for further treatment. Recovery time appears to be roughly 1-2 weeks for mild symptoms and 3-6 weeks for severe disease.   GO IMMEDIATELY TO ER FOR FEVER YOU ARE UNABLE TO GET DOWN WITH TYLENOL, BREATHING PROBLEMS, CHEST PAIN, FATIGUE, LETHARGY, INABILITY TO EAT OR DRINK, ETC  QUARANTINE AND ISOLATION: To help decrease the spread of COVID-19 please remain isolated if you have COVID infection or are highly suspected to have COVID infection.  This means -stay home and isolate to one room in the home if you live with others. Do not share a bed or bathroom with others while ill, sanitize and wipe down all countertops and keep common areas clean and disinfected. Stay home for 5 days. If you have no symptoms or your symptoms are resolving after 5 days, you can leave your house. Continue to wear a mask around others for 5 additional days. If you have been in close contact (within 6 feet) of someone diagnosed with COVID 19, you are advised to quarantine in your home for 14 days as symptoms can develop anywhere from 2-14 days after exposure to the virus. If you develop symptoms, you  must isolate.  Most current guidelines for COVID  after exposure -unvaccinated: isolate 5 days and strict mask use x 5 days. Test on day 5 is possible -vaccinated: wear mask x 10 days if symptoms do not develop -You do not necessarily need to be tested for COVID if you have + exposure and  develop symptoms. Just isolate at home x10 days from symptom onset During this global pandemic, CDC advises to practice social distancing, try to stay at least 18ft away from others at all times. Wear a face covering. Wash and sanitize your hands regularly and avoid going anywhere that is not necessary.  KEEP IN MIND THAT THE COVID TEST IS NOT 100% ACCURATE AND YOU SHOULD STILL DO EVERYTHING TO PREVENT POTENTIAL SPREAD OF VIRUS TO OTHERS (WEAR MASK, WEAR GLOVES, Happy HANDS AND SANITIZE REGULARLY). IF INITIAL TEST IS NEGATIVE, THIS MAY NOT MEAN YOU ARE DEFINITELY NEGATIVE. MOST ACCURATE TESTING IS DONE 5-7 DAYS AFTER EXPOSURE.   It is not advised by CDC to get re-tested after receiving a positive COVID test since you can still test positive for weeks to months after you have already cleared the virus.   *If you have not been vaccinated for COVID, I strongly suggest you consider getting vaccinated as long as there are no contraindications.      ED Prescriptions    Medication Sig Dispense Auth. Provider   predniSONE (DELTASONE) 20 MG tablet Take 2 tablets (40 mg total) by mouth daily for 5 days. 10 tablet Laurene Footman B, PA-C   benzonatate (TESSALON) 100 MG capsule Take 2 capsules (200 mg total) by mouth 3 (three) times daily as needed for up to 7 days for cough. 21 capsule Laurene Footman B, PA-C   azithromycin (ZITHROMAX) 250 MG tablet Take 1 tablet (250 mg total) by mouth daily. Take first 2 tablets together, then 1 every day until finished. 6 tablet Gretta Cool     PDMP not reviewed this encounter.   Danton Clap, PA-C 12/26/20 [phone]

## 2020-12-25 NOTE — Discharge Instructions (Addendum)
Flu test is negative.  COVID test will be back tomorrow.  I have sent in azithromycin which is an antibiotic and prednisone to help treat a mild exacerbation of your COPD.  This is likely a viral illness and COVID-19 is not ruled out.  See more information guarding COVID-19 below.  If you do test positive you will be a good candidate for antiviral or antibody therapy and someone will contact you regarding that sometime tomorrow.  You should stay home until you get result.  You have received COVID testing today either for positive exposure, concerning symptoms that could be related to COVID infection, screening purposes, or re-testing after confirmed positive.  Your test obtained today checks for active viral infection in the last 1-2 weeks. If your test is negative now, you can still test positive later. So, if you do develop symptoms you should either get re-tested and/or isolate x 5 days and then strict mask use x 5 days (unvaccinated) or mask use x 10 days (vaccinated). Please follow CDC guidelines.  While Rapid antigen tests come back in 15-20 minutes, send out PCR/molecular test results typically come back within 1-3 days. In the mean time, if you are symptomatic, assume this could be a positive test and treat/monitor yourself as if you do have COVID.   We will call with test results if positive. Please download the MyChart app and set up a profile to access test results.   If symptomatic, go home and rest. Push fluids. Take Tylenol as needed for discomfort. Gargle warm salt water. Throat lozenges. Take Mucinex DM or Robitussin for cough. Humidifier in bedroom to ease coughing. Warm showers. Also review the COVID handout for more information.  COVID-19 INFECTION: The incubation period of COVID-19 is approximately 14 days after exposure, with most symptoms developing in roughly 4-5 days. Symptoms may range in severity from mild to critically severe. Roughly 80% of those infected will have mild  symptoms. People of any age may become infected with COVID-19 and have the ability to transmit the virus. The most common symptoms include: fever, fatigue, cough, body aches, headaches, sore throat, nasal congestion, shortness of breath, nausea, vomiting, diarrhea, changes in smell and/or taste.    COURSE OF ILLNESS Some patients may begin with mild disease which can progress quickly into critical symptoms. If your symptoms are worsening please call ahead to the Emergency Department and proceed there for further treatment. Recovery time appears to be roughly 1-2 weeks for mild symptoms and 3-6 weeks for severe disease.   GO IMMEDIATELY TO ER FOR FEVER YOU ARE UNABLE TO GET DOWN WITH TYLENOL, BREATHING PROBLEMS, CHEST PAIN, FATIGUE, LETHARGY, INABILITY TO EAT OR DRINK, ETC  QUARANTINE AND ISOLATION: To help decrease the spread of COVID-19 please remain isolated if you have COVID infection or are highly suspected to have COVID infection. This means -stay home and isolate to one room in the home if you live with others. Do not share a bed or bathroom with others while ill, sanitize and wipe down all countertops and keep common areas clean and disinfected. Stay home for 5 days. If you have no symptoms or your symptoms are resolving after 5 days, you can leave your house. Continue to wear a mask around others for 5 additional days. If you have been in close contact (within 6 feet) of someone diagnosed with COVID 19, you are advised to quarantine in your home for 14 days as symptoms can develop anywhere from 2-14 days after exposure to the  virus. If you develop symptoms, you  must isolate.  Most current guidelines for COVID after exposure -unvaccinated: isolate 5 days and strict mask use x 5 days. Test on day 5 is possible -vaccinated: wear mask x 10 days if symptoms do not develop -You do not necessarily need to be tested for COVID if you have + exposure and  develop symptoms. Just isolate at home x10 days  from symptom onset During this global pandemic, CDC advises to practice social distancing, try to stay at least 65ft away from others at all times. Wear a face covering. Wash and sanitize your hands regularly and avoid going anywhere that is not necessary.  KEEP IN MIND THAT THE COVID TEST IS NOT 100% ACCURATE AND YOU SHOULD STILL DO EVERYTHING TO PREVENT POTENTIAL SPREAD OF VIRUS TO OTHERS (WEAR MASK, WEAR GLOVES, [place] HANDS AND SANITIZE REGULARLY). IF INITIAL TEST IS NEGATIVE, THIS MAY NOT MEAN YOU ARE DEFINITELY NEGATIVE. MOST ACCURATE TESTING IS DONE 5-7 DAYS AFTER EXPOSURE.   It is not advised by CDC to get re-tested after receiving a positive COVID test since you can still test positive for weeks to months after you have already cleared the virus.   *If you have not been vaccinated for COVID, I strongly suggest you consider getting vaccinated as long as there are no contraindications.

## 2020-12-26 LAB — SARS CORONAVIRUS 2 (TAT 6-24 HRS): SARS Coronavirus 2: NEGATIVE

## 2021-01-04 ENCOUNTER — Ambulatory Visit
Admission: EM | Admit: 2021-01-04 | Discharge: 2021-01-04 | Disposition: A | Discharge status: Home / Self Care | Payer: BC Managed Care – PPO | Attending: Physician Assistant | Admitting: Physician Assistant

## 2021-01-04 ENCOUNTER — Other Ambulatory Visit: Payer: Self-pay

## 2021-01-04 ENCOUNTER — Ambulatory Visit (INDEPENDENT_AMBULATORY_CARE_PROVIDER_SITE_OTHER): Payer: BC Managed Care – PPO

## 2021-01-04 DIAGNOSIS — Z20822 Contact with and (suspected) exposure to covid-19: Secondary | ICD-10-CM | POA: Insufficient documentation

## 2021-01-04 DIAGNOSIS — J441 Chronic obstructive pulmonary disease with (acute) exacerbation: Secondary | ICD-10-CM | POA: Insufficient documentation

## 2021-01-04 DIAGNOSIS — R058 Other specified cough: Secondary | ICD-10-CM | POA: Insufficient documentation

## 2021-01-04 DIAGNOSIS — R059 Cough, unspecified: Secondary | ICD-10-CM

## 2021-01-04 LAB — SARS CORONAVIRUS 2 (TAT 6-24 HRS): SARS Coronavirus 2: NEGATIVE

## 2021-01-04 MED ORDER — PROMETHAZINE-DM 6.25-15 MG/5ML PO SYRP: 5.0000 mL | ORAL_SOLUTION | Freq: Four times a day (QID) | ORAL | 0 refills | Status: DC | PRN

## 2021-01-04 MED ORDER — PREDNISONE 20 MG PO TABS: 40.0000 mg | ORAL_TABLET | Freq: Every day | ORAL | 0 refills | Status: AC

## 2021-01-04 NOTE — ED Triage Notes (Signed)
Pt reports having cough and bodyaches x 2 weeks. He was seen here on 6/6 for the same.

## 2021-01-04 NOTE — ED Provider Notes (Signed)
[facility]    CSN: [id number] Arrival date & time: 01/04/21  1123      History   Chief Complaint Chief Complaint  Patient presents with   Cough    HPI [name] is a 60 y.o. male with history of COPD presenting for approximate 11-day history of cough, fatigue and body aches.  Patient originally seen by me on 12/25/20 when he presented with concerns about fever (afebrile at visit), cough, fatigue and body aches after exposure to COVID-19 through his work.  Patient had negative influenza and COVID-19 testing at that time.  Patient says that over the past 10 days he has had continued cough, fatigue and body aches, but the fevers had resolved until yesterday when he says he spiked a fever of 102 degrees.  Patient says he has been taking ibuprofen and Tylenol for fever control.  He is afebrile on today's exam.  He does state that he has felt a little more short of breath than normal and has been having to use his albuterol inhaler daily whereas he only used it about once or twice a month before.  Patient continues to report concerns for COVID-19.  He does admit to personal history of COVID-19 about a year ago and says that his current symptoms do feel similar to that.  He is not vaccinated for COVID-19.  Patient says he has been taking over-the-counter Mucinex and did try the benzonatate for cough but it did not help him.  Patient was prescribed azithromycin and prednisone in addition to the benzonatate at his last visit 10 days ago.  He says that he completed both medications.  He says he thinks the prednisone did help.  He has no other complaints.  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   Calculus of kidney 03/14/2015   COPD, mild ([place]) 03/25/2014   History of kidney stones    HLD (hyperlipidemia) 12/01/2013   Hypertension    Renal stones     Patient  Active Problem List   Diagnosis Date Noted   Chest pain 11/01/2020   Type 2 diabetes mellitus ([place]) 11/01/2020   Hyperlipidemia associated with type 2 diabetes mellitus ([place]) 11/01/2020   AAA (abdominal aortic aneurysm) without rupture ([place]) 05/13/2016   PVD (peripheral vascular disease) ([place]) 04/10/2016   Hypertension associated with diabetes ([place]) 03/14/2015   Calculus of kidney 03/14/2015   COPD, mild ([place]) 03/25/2014   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   [place], [place]   CYSTOSCOPY W/ URETERAL STENT PLACEMENT Left 01/28/2015   Procedure: CYSTOSCOPY WITH RETROGRADE PYELOGRAM/URETERAL STENT PLACEMENT;  Surgeon: Alexis Frock, MD;  Location: [facility];  Service: Urology;  Laterality: Left;   CYSTOSCOPY/URETEROSCOPY/HOLMIUM LASER/STENT PLACEMENT Left 02/08/2015   Procedure: CYSTOSCOPY/URETEROSCOPY/HOLMIUM LASER/STENT PLACEMENT;  Surgeon: Hollice Espy, MD;  Location: [facility];  Service: Urology;  Laterality: Left;   CYSTOSCOPY/URETEROSCOPY/HOLMIUM LASER/STENT PLACEMENT Left 01/07/2018   Procedure: CYSTOSCOPY/URETEROSCOPY/HOLMIUM LASER/STENT PLACEMENT;  Surgeon: Hollice Espy, MD;  Location: [facility];  Service: Urology;  Laterality: Left;   FACIAL COSMETIC SURGERY     21 fractures in left cheek and face from baseball   LITHOTRIPSY  VASECTOMY         Home Medications    Prior to Admission medications   Medication Sig Start Date End Date Taking? Authorizing Provider  predniSONE (DELTASONE) 20 MG tablet Take 2 tablets (40 mg total) by mouth daily for 5 days. 01/04/21 01/09/21 Yes Danton Clap, PA-C  promethazine-dextromethorphan (PROMETHAZINE-DM) 6.25-15 MG/5ML syrup Take 5 mLs by mouth 4 (four) times daily as needed for cough. 01/04/21  Yes Danton Clap, PA-C  albuterol (VENTOLIN HFA)  108 (90 Base) MCG/ACT inhaler Inhale 2 puffs into the lungs every 6 (six) hours as needed for wheezing or shortness of breath. 08/27/19   Caryn Section Linden Dolin, PA-C  aspirin EC 325 MG tablet Take 1 tablet (325 mg total) by mouth daily. 05/04/19   Carrie Mew, MD  atorvastatin (LIPITOR) 40 MG tablet Take 40 mg by mouth daily. 02/03/18   [provider]  azithromycin (ZITHROMAX) 250 MG tablet Take 1 tablet (250 mg total) by mouth daily. Take first 2 tablets together, then 1 every day until finished. 12/25/20   Danton Clap, PA-C  esomeprazole (NEXIUM) 20 MG capsule Take 20 mg by mouth every morning.    [provider]  fenofibrate (TRICOR) 145 MG tablet Take 145 mg by mouth every morning.  01/17/15   [provider]  Ipratropium-Albuterol (COMBIVENT) 20-100 MCG/ACT AERS respimat Inhale 1 puff into the lungs 4 (four) times daily as needed for wheezing.  03/25/14   [provider]  lisinopril (PRINIVIL,ZESTRIL) 10 MG tablet Take 10 mg by mouth every morning.     [provider]  metFORMIN (GLUCOPHAGE) 500 MG tablet Take 500 mg by mouth 2 (two) times daily with a meal. 12/03/17   [provider]  Multiple Vitamin (MULTI-VITAMINS) TABS Take 1 tablet by mouth every morning.     [provider]  pioglitazone (ACTOS) 15 MG tablet Take 15 mg by mouth daily. 10/31/20   [provider]  sildenafil (REVATIO) 20 MG tablet Take 20-60 mg by mouth daily as needed. 10/21/20   [provider]    Family History Family History  Problem Relation Age of Onset   Diabetes Mother    Diabetes Maternal Uncle    Cancer Maternal Uncle    Cancer Maternal Uncle    Prostate cancer Neg Hx    Bladder Cancer Neg Hx    Kidney cancer Neg Hx     Social History Social History   Tobacco Use   Smoking status: Every Day    Packs/day: 10.00    Years: 30.00    Pack years: 300.00    Types: Cigarettes   Smokeless tobacco: Never  Vaping Use   Vaping Use:  Never used  Substance Use Topics   Alcohol use: No    Alcohol/week: 0.0 standard drinks   Drug use: No     Allergies   Patient has no known allergies.   Review of Systems Review of Systems  Constitutional:  Positive for fatigue. Negative for fever.  HENT:  Positive for congestion. Negative for rhinorrhea, sinus pressure, sinus pain and sore throat.   Respiratory:  Positive for cough and shortness of breath.   Cardiovascular:  Negative for chest pain.  Gastrointestinal:  Negative for abdominal pain, diarrhea, nausea and vomiting.  Musculoskeletal:  Positive for myalgias.  Neurological:  Negative for weakness, light-headedness and headaches.  Hematological:  Negative for adenopathy.    Physical Exam Triage Vital Signs ED Triage Vitals [01/04/21 1133]  Enc Vitals Group  BP (!) 146/100     Pulse Rate 100     Resp 18     Temp 98.2 F (36.8 C)     Temp Source Oral     SpO2 98 %     Weight 225 lb 5 oz (102.2 kg)     Height 5\' 9"  (1.753 m)     Head Circumference      Peak Flow      Pain Score 5     Pain Loc      Pain Edu?      Excl. in [place]?    No data found.  Updated Vital Signs BP (!) 146/100   Pulse 100   Temp 98.2 F (36.8 C) (Oral)   Resp 18   Ht 5\' 9"  (1.753 m)   Wt 225 lb 5 oz (102.2 kg)   SpO2 98%   BMI 33.27 kg/m       Physical Exam Vitals and nursing note reviewed.  Constitutional:      General: He is not in acute distress.    Appearance: Normal appearance. He is well-developed. He is not ill-appearing or diaphoretic.  HENT:     Head: Normocephalic and atraumatic.     Nose: Nose normal.     Mouth/Throat:     Mouth: Mucous membranes are moist.     Pharynx: Oropharynx is clear. Uvula midline.     Tonsils: No tonsillar abscesses.  Eyes:     General: No scleral icterus.       Right eye: No discharge.        Left eye: No discharge.     Conjunctiva/sclera: Conjunctivae normal.  Neck:     Thyroid: No thyromegaly.     Trachea: No tracheal  deviation.  Cardiovascular:     Rate and Rhythm: Normal rate and regular rhythm.     Heart sounds: Normal heart sounds.  Pulmonary:     Effort: Pulmonary effort is normal. No respiratory distress.     Breath sounds: Examination of the right-lower field reveals wheezing. Examination of the left-lower field reveals wheezing. Wheezing present. No rhonchi or rales.  Musculoskeletal:     Cervical back: Neck supple.  Skin:    General: Skin is dry.  Neurological:     General: No focal deficit present.     Mental Status: He is alert. Mental status is at baseline.     Motor: No weakness.     Gait: Gait normal.  Psychiatric:        Mood and Affect: Mood normal.        Behavior: Behavior normal.        Thought Content: Thought content normal.     UC Treatments / Results  Labs (all labs ordered are listed, but only abnormal results are displayed) Labs Reviewed  SARS CORONAVIRUS 2 (TAT 6-24 HRS)    EKG   Radiology DG Chest 2 View  Result Date: 01/04/2021 CLINICAL DATA:  Cough and body aches for 2 weeks EXAM: CHEST - 2 VIEW COMPARISON:  11/01/2020 FINDINGS: Cardiac shadow is within normal limits. The lungs are well aerated bilaterally. No focal infiltrate or sizable effusion is seen. No bony abnormality is noted. IMPRESSION: No active cardiopulmonary disease. Electronically Signed   By: Inez Catalina M.D.   On: 01/04/2021 11:53    Procedures Procedures (including critical care time)  Medications Ordered in UC Medications - No data to display  Initial Impression / Assessment and Plan / UC Course  I have  reviewed the triage vital signs and the nursing notes.  Pertinent labs & imaging results that were available during my care of the patient were reviewed by me and considered in my medical decision making (see chart for details).  60 year old male presenting for approximate 11-day history of cough, fatigue, body aches.  Patient says that yesterday he developed a fever up to 102  degrees.  He says that he did have a fever at onset of illness but fever had resolved.  He also says the cough is nonproductive and he has had some increased shortness of breath.  BP is a little elevated in clinic today 146/100.  He is afebrile.  Oxygen is 98%.  He is well-appearing and in no acute distress.  Exam revealing for wheezes of bilateral lung bases.  Patient requested send out PCR COVID test so that has been performed again today.  Current CDC guidelines, isolation protocol and ED precautions reviewed patient.  Advised that if he test positive I am unsure if he is like overall and had an initial false negative or if the COVID infection is new since yesterday when the fever returned.  Advised following isolation protocol from yesterday.  Chest x-ray obtained today due to concern for possible pneumonia.  Chest x-ray independently viewed by me.  Overread confirms normal x-ray.  Reviewed this with patient.  Advised patient it is possible he is having 2 back-to-back viral illnesses.  He has already had antibiotics which apparently did not help.  Since he has no evidence of pneumonia, holding off on sending in another antibiotic for him.  I did send in prednisone and promethazine DM.  Encouraged him to increase rest and fluids and use his albuterol inhaler if needed.  Advised someone will contact him with the result of the COVID test if he is positive, otherwise he will be on MyChart.   Advised him to please return to our clinic, PCP or go to the ED if he has any worsening of symptoms, especially if he continues to have a fever after the next 3 days, worsening cough, or breathing difficulty not improved with use of the albuterol and prednisone.  Patient agrees.   Final Clinical Impressions(s) / UC Diagnoses   Final diagnoses:  COPD exacerbation ([place])  Productive cough  Exposure to COVID-19 virus     Discharge Instructions      Your chest x-ray is normal today.  There is no evidence of  pneumonia which is great.  We have sent out a COVID test and that should be back later this evening or by this time tomorrow.  I have sent in prednisone for meals for cough.  Increase rest and fluids.  Continue with Tylenol and/or Motrin for fever control.  If your COVID test ends up being positive I am unsure if you are newly positive since yesterday when you had a return of the fever or if you have had COVID-19 this whole time.  If you do end up testing positive for COVID-19, I would follow CDC guidelines and isolation protocol starting from yesterday.  Someone will contact you if you are positive.  Result will also come through MyChart.  Please return here if you are continue to have fever after the next 3 days or so, worsening cough or respiratory difficulty.  Please go to ED for any severe acute worsening of your symptoms.  You have received COVID testing today either for positive exposure, concerning symptoms that could be related to COVID infection, screening purposes,  or re-testing after confirmed positive.  Your test obtained today checks for active viral infection in the last 1-2 weeks. If your test is negative now, you can still test positive later. So, if you do develop symptoms you should either get re-tested and/or isolate x 5 days and then strict mask use x 5 days (unvaccinated) or mask use x 10 days (vaccinated). Please follow CDC guidelines.  While Rapid antigen tests come back in 15-20 minutes, send out PCR/molecular test results typically come back within 1-3 days. In the mean time, if you are symptomatic, assume this could be a positive test and treat/monitor yourself as if you do have COVID.   We will call with test results if positive. Please download the MyChart app and set up a profile to access test results.   If symptomatic, go home and rest. Push fluids. Take Tylenol as needed for discomfort. Gargle warm salt water. Throat lozenges. Take Mucinex DM or Robitussin for cough.  Humidifier in bedroom to ease coughing. Warm showers. Also review the COVID handout for more information.  COVID-19 INFECTION: The incubation period of COVID-19 is approximately 14 days after exposure, with most symptoms developing in roughly 4-5 days. Symptoms may range in severity from mild to critically severe. Roughly 80% of those infected will have mild symptoms. People of any age may become infected with COVID-19 and have the ability to transmit the virus. The most common symptoms include: fever, fatigue, cough, body aches, headaches, sore throat, nasal congestion, shortness of breath, nausea, vomiting, diarrhea, changes in smell and/or taste.    COURSE OF ILLNESS Some patients may begin with mild disease which can progress quickly into critical symptoms. If your symptoms are worsening please call ahead to the Emergency Department and proceed there for further treatment. Recovery time appears to be roughly 1-2 weeks for mild symptoms and 3-6 weeks for severe disease.   GO IMMEDIATELY TO ER FOR FEVER YOU ARE UNABLE TO GET DOWN WITH TYLENOL, BREATHING PROBLEMS, CHEST PAIN, FATIGUE, LETHARGY, INABILITY TO EAT OR DRINK, ETC  QUARANTINE AND ISOLATION: To help decrease the spread of COVID-19 please remain isolated if you have COVID infection or are highly suspected to have COVID infection. This means -stay home and isolate to one room in the home if you live with others. Do not share a bed or bathroom with others while ill, sanitize and wipe down all countertops and keep common areas clean and disinfected. Stay home for 5 days. If you have no symptoms or your symptoms are resolving after 5 days, you can leave your house. Continue to wear a mask around others for 5 additional days. If you have been in close contact (within 6 feet) of someone diagnosed with COVID 19, you are advised to quarantine in your home for 14 days as symptoms can develop anywhere from 2-14 days after exposure to the virus. If you  develop symptoms, you  must isolate.  Most current guidelines for COVID after exposure -unvaccinated: isolate 5 days and strict mask use x 5 days. Test on day 5 is possible -vaccinated: wear mask x 10 days if symptoms do not develop -You do not necessarily need to be tested for COVID if you have + exposure and  develop symptoms. Just isolate at home x10 days from symptom onset During this global pandemic, CDC advises to practice social distancing, try to stay at least 36ft away from others at all times. Wear a face covering. Wash and sanitize your hands regularly and avoid going  anywhere that is not necessary.  KEEP IN MIND THAT THE COVID TEST IS NOT 100% ACCURATE AND YOU SHOULD STILL DO EVERYTHING TO PREVENT POTENTIAL SPREAD OF VIRUS TO OTHERS (WEAR MASK, WEAR GLOVES, [place] HANDS AND SANITIZE REGULARLY). IF INITIAL TEST IS NEGATIVE, THIS MAY NOT MEAN YOU ARE DEFINITELY NEGATIVE. MOST ACCURATE TESTING IS DONE 5-7 DAYS AFTER EXPOSURE.   It is not advised by CDC to get re-tested after receiving a positive COVID test since you can still test positive for weeks to months after you have already cleared the virus.   *If you have not been vaccinated for COVID, I strongly suggest you consider getting vaccinated as long as there are no contraindications.       ED Prescriptions     Medication Sig Dispense Auth. Provider   predniSONE (DELTASONE) 20 MG tablet Take 2 tablets (40 mg total) by mouth daily for 5 days. 10 tablet Danton Clap, PA-C   promethazine-dextromethorphan (PROMETHAZINE-DM) 6.25-15 MG/5ML syrup Take 5 mLs by mouth 4 (four) times daily as needed for cough. 118 mL Danton Clap, PA-C      PDMP not reviewed this encounter.   Danton Clap, PA-C 01/04/21 1241

## 2021-01-04 NOTE — Discharge Instructions (Addendum)
Your chest x-ray is normal today.  There is no evidence of pneumonia which is great.  We have sent out a COVID test and that should be back later this evening or by this time tomorrow.  I have sent in prednisone for meals for cough.  Increase rest and fluids.  Continue with Tylenol and/or Motrin for fever control.  If your COVID test ends up being positive I am unsure if you are newly positive since yesterday when you had a return of the fever or if you have had COVID-19 this whole time.  If you do end up testing positive for COVID-19, I would follow CDC guidelines and isolation protocol starting from yesterday.  Someone will contact you if you are positive.  Result will also come through MyChart.  Please return here if you are continue to have fever after the next 3 days or so, worsening cough or respiratory difficulty.  Please go to ED for any severe acute worsening of your symptoms.  You have received COVID testing today either for positive exposure, concerning symptoms that could be related to COVID infection, screening purposes, or re-testing after confirmed positive.  Your test obtained today checks for active viral infection in the last 1-2 weeks. If your test is negative now, you can still test positive later. So, if you do develop symptoms you should either get re-tested and/or isolate x 5 days and then strict mask use x 5 days (unvaccinated) or mask use x 10 days (vaccinated). Please follow CDC guidelines.  While Rapid antigen tests come back in 15-20 minutes, send out PCR/molecular test results typically come back within 1-3 days. In the mean time, if you are symptomatic, assume this could be a positive test and treat/monitor yourself as if you do have COVID.   We will call with test results if positive. Please download the MyChart app and set up a profile to access test results.   If symptomatic, go home and rest. Push fluids. Take Tylenol as needed for discomfort. Gargle warm salt water. Throat  lozenges. Take Mucinex DM or Robitussin for cough. Humidifier in bedroom to ease coughing. Warm showers. Also review the COVID handout for more information.  COVID-19 INFECTION: The incubation period of COVID-19 is approximately 14 days after exposure, with most symptoms developing in roughly 4-5 days. Symptoms may range in severity from mild to critically severe. Roughly 80% of those infected will have mild symptoms. People of any age may become infected with COVID-19 and have the ability to transmit the virus. The most common symptoms include: fever, fatigue, cough, body aches, headaches, sore throat, nasal congestion, shortness of breath, nausea, vomiting, diarrhea, changes in smell and/or taste.    COURSE OF ILLNESS Some patients may begin with mild disease which can progress quickly into critical symptoms. If your symptoms are worsening please call ahead to the Emergency Department and proceed there for further treatment. Recovery time appears to be roughly 1-2 weeks for mild symptoms and 3-6 weeks for severe disease.   GO IMMEDIATELY TO ER FOR FEVER YOU ARE UNABLE TO GET DOWN WITH TYLENOL, BREATHING PROBLEMS, CHEST PAIN, FATIGUE, LETHARGY, INABILITY TO EAT OR DRINK, ETC  QUARANTINE AND ISOLATION: To help decrease the spread of COVID-19 please remain isolated if you have COVID infection or are highly suspected to have COVID infection. This means -stay home and isolate to one room in the home if you live with others. Do not share a bed or bathroom with others while ill, sanitize and wipe  down all countertops and keep common areas clean and disinfected. Stay home for 5 days. If you have no symptoms or your symptoms are resolving after 5 days, you can leave your house. Continue to wear a mask around others for 5 additional days. If you have been in close contact (within 6 feet) of someone diagnosed with COVID 19, you are advised to quarantine in your home for 14 days as symptoms can develop anywhere from  2-14 days after exposure to the virus. If you develop symptoms, you  must isolate.  Most current guidelines for COVID after exposure -unvaccinated: isolate 5 days and strict mask use x 5 days. Test on day 5 is possible -vaccinated: wear mask x 10 days if symptoms do not develop -You do not necessarily need to be tested for COVID if you have + exposure and  develop symptoms. Just isolate at home x10 days from symptom onset During this global pandemic, CDC advises to practice social distancing, try to stay at least 50ft away from others at all times. Wear a face covering. Wash and sanitize your hands regularly and avoid going anywhere that is not necessary.  KEEP IN MIND THAT THE COVID TEST IS NOT 100% ACCURATE AND YOU SHOULD STILL DO EVERYTHING TO PREVENT POTENTIAL SPREAD OF VIRUS TO OTHERS (WEAR MASK, WEAR GLOVES, [place] HANDS AND SANITIZE REGULARLY). IF INITIAL TEST IS NEGATIVE, THIS MAY NOT MEAN YOU ARE DEFINITELY NEGATIVE. MOST ACCURATE TESTING IS DONE 5-7 DAYS AFTER EXPOSURE.   It is not advised by CDC to get re-tested after receiving a positive COVID test since you can still test positive for weeks to months after you have already cleared the virus.   *If you have not been vaccinated for COVID, I strongly suggest you consider getting vaccinated as long as there are no contraindications.

## 2021-05-02 DIAGNOSIS — H65111 Acute and subacute allergic otitis media (mucoid) (sanguinous) (serous), right ear: Secondary | ICD-10-CM | POA: Diagnosis not present

## 2021-05-02 DIAGNOSIS — H6123 Impacted cerumen, bilateral: Secondary | ICD-10-CM | POA: Diagnosis not present

## 2021-05-02 DIAGNOSIS — H6240 Otitis externa in other diseases classified elsewhere, unspecified ear: Secondary | ICD-10-CM | POA: Diagnosis not present

## 2021-05-02 DIAGNOSIS — B369 Superficial mycosis, unspecified: Secondary | ICD-10-CM | POA: Diagnosis not present

## 2021-05-02 DIAGNOSIS — R42 Dizziness and giddiness: Secondary | ICD-10-CM | POA: Diagnosis not present

## 2021-08-22 DIAGNOSIS — E1129 Type 2 diabetes mellitus with other diabetic kidney complication: Secondary | ICD-10-CM | POA: Diagnosis not present

## 2021-08-22 DIAGNOSIS — R809 Proteinuria, unspecified: Secondary | ICD-10-CM | POA: Diagnosis not present

## 2021-08-22 DIAGNOSIS — I10 Essential (primary) hypertension: Secondary | ICD-10-CM | POA: Diagnosis not present

## 2021-08-22 DIAGNOSIS — J449 Chronic obstructive pulmonary disease, unspecified: Secondary | ICD-10-CM | POA: Diagnosis not present

## 2021-08-22 DIAGNOSIS — E782 Mixed hyperlipidemia: Secondary | ICD-10-CM | POA: Diagnosis not present

## 2022-03-04 DIAGNOSIS — F17210 Nicotine dependence, cigarettes, uncomplicated: Secondary | ICD-10-CM | POA: Diagnosis not present

## 2022-03-04 DIAGNOSIS — E1129 Type 2 diabetes mellitus with other diabetic kidney complication: Secondary | ICD-10-CM | POA: Diagnosis not present

## 2022-03-04 DIAGNOSIS — R809 Proteinuria, unspecified: Secondary | ICD-10-CM | POA: Diagnosis not present

## 2022-03-04 DIAGNOSIS — I10 Essential (primary) hypertension: Secondary | ICD-10-CM | POA: Diagnosis not present

## 2022-03-04 DIAGNOSIS — E782 Mixed hyperlipidemia: Secondary | ICD-10-CM | POA: Diagnosis not present

## 2022-03-04 DIAGNOSIS — J449 Chronic obstructive pulmonary disease, unspecified: Secondary | ICD-10-CM | POA: Diagnosis not present

## 2022-03-04 DIAGNOSIS — Z125 Encounter for screening for malignant neoplasm of prostate: Secondary | ICD-10-CM | POA: Diagnosis not present

## 2022-04-16 ENCOUNTER — Encounter: Payer: Self-pay | Admitting: Emergency Medicine

## 2022-04-16 ENCOUNTER — Ambulatory Visit
Admission: EM | Admit: 2022-04-16 | Discharge: 2022-04-16 | Disposition: A | Discharge status: Home / Self Care | Payer: BC Managed Care – PPO | Attending: Physician Assistant | Admitting: Physician Assistant

## 2022-04-16 DIAGNOSIS — E119 Type 2 diabetes mellitus without complications: Secondary | ICD-10-CM

## 2022-04-16 DIAGNOSIS — L03115 Cellulitis of right lower limb: Secondary | ICD-10-CM | POA: Diagnosis not present

## 2022-04-16 DIAGNOSIS — T24001A Burn of unspecified degree of unspecified site of right lower limb, except ankle and foot, initial encounter: Secondary | ICD-10-CM | POA: Diagnosis not present

## 2022-04-16 MED ORDER — MUPIROCIN 2 % EX OINT: 1.0000 | TOPICAL_OINTMENT | Freq: Two times a day (BID) | CUTANEOUS | 0 refills | Status: DC

## 2022-04-16 MED ORDER — CEPHALEXIN 500 MG PO CAPS: 500.0000 mg | ORAL_CAPSULE | Freq: Four times a day (QID) | ORAL | 0 refills | Status: AC

## 2022-04-16 NOTE — Discharge Instructions (Addendum)
-  Put a referral into wound care for your burning and diabetic wound. - I sent antibiotics to pharmacy. -Clean the area soap and water every day and apply the mupirocin ointment.  Cover with nonadherent bandage and wrapped loosely with Coban.  Leave it open to air when you are at home. - Make sure to follow-up with wound care.  This is something that is going to take more time to heal and you need further follow-ups.  You are diabetic and have venous disease and that makes this even more important that you follow-up closely.
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
## 2022-04-16 NOTE — ED Triage Notes (Signed)
Pt presents with a rash above his left ankle for several months. The area is red and is oozing. Pt was treated in August with a topical abx by his PCP. The are is also painful and has a burning sensation.

## 2022-04-24 ENCOUNTER — Emergency Department
Admission: EM | Admit: 2022-04-24 | Discharge: 2022-04-24 | Disposition: A | Discharge status: Home / Self Care | Payer: BC Managed Care – PPO | Attending: Emergency Medicine | Admitting: Emergency Medicine

## 2022-04-24 ENCOUNTER — Encounter: Payer: Self-pay | Admitting: *Deleted

## 2022-04-24 ENCOUNTER — Other Ambulatory Visit: Payer: Self-pay

## 2022-04-24 DIAGNOSIS — E11622 Type 2 diabetes mellitus with other skin ulcer: Secondary | ICD-10-CM | POA: Diagnosis not present

## 2022-04-24 DIAGNOSIS — I83013 Varicose veins of right lower extremity with ulcer of ankle: Secondary | ICD-10-CM

## 2022-04-24 DIAGNOSIS — L97319 Non-pressure chronic ulcer of right ankle with unspecified severity: Secondary | ICD-10-CM | POA: Diagnosis not present

## 2022-04-24 DIAGNOSIS — E11621 Type 2 diabetes mellitus with foot ulcer: Secondary | ICD-10-CM | POA: Diagnosis not present

## 2022-04-24 DIAGNOSIS — L97519 Non-pressure chronic ulcer of other part of right foot with unspecified severity: Secondary | ICD-10-CM | POA: Diagnosis not present

## 2022-04-24 LAB — CBC WITH DIFFERENTIAL/PLATELET
Abs Immature Granulocytes: 0.06 10*3/uL (ref 0.00–0.07)
Basophils Absolute: 0.1 10*3/uL (ref 0.0–0.1)
Basophils Relative: 1 %
Eosinophils Absolute: 0.4 10*3/uL (ref 0.0–0.5)
Eosinophils Relative: 4 %
HCT: 47.1 % (ref 39.0–52.0)
Hemoglobin: 15.5 g/dL (ref 13.0–17.0)
Immature Granulocytes: 1 %
Lymphocytes Relative: 35 %
Lymphs Abs: 3.6 10*3/uL (ref 0.7–4.0)
MCH: 31.4 pg (ref 26.0–34.0)
MCHC: 32.9 g/dL (ref 30.0–36.0)
MCV: 95.5 fL (ref 80.0–100.0)
Monocytes Absolute: 0.7 10*3/uL (ref 0.1–1.0)
Monocytes Relative: 7 %
Neutro Abs: 5.7 10*3/uL (ref 1.7–7.7)
Neutrophils Relative %: 52 %
Platelets: 270 10*3/uL (ref 150–400)
RBC: 4.93 MIL/uL (ref 4.22–5.81)
RDW: 13.3 % (ref 11.5–15.5)
WBC: 10.5 10*3/uL (ref 4.0–10.5)
nRBC: 0 % (ref 0.0–0.2)

## 2022-04-24 LAB — COMPREHENSIVE METABOLIC PANEL
ALT: 22 U/L (ref 0–44)
AST: 22 U/L (ref 15–41)
Albumin: 3.6 g/dL (ref 3.5–5.0)
Alkaline Phosphatase: 35 U/L — ABNORMAL LOW (ref 38–126)
Anion gap: 9 (ref 5–15)
BUN: 22 mg/dL — ABNORMAL HIGH (ref 6–20)
CO2: 22 mmol/L (ref 22–32)
Calcium: 9 mg/dL (ref 8.9–10.3)
Chloride: 109 mmol/L (ref 98–111)
Creatinine, Ser: 1.13 mg/dL (ref 0.61–1.24)
GFR, Estimated: >60 mL/min (ref >60)
Glucose, Bld: 119 mg/dL — ABNORMAL HIGH (ref 70–99)
Potassium: 3.8 mmol/L (ref 3.5–5.1)
Sodium: 140 mmol/L (ref 135–145)
Total Bilirubin: 0.7 mg/dL (ref 0.3–1.2)
Total Protein: 6.6 g/dL (ref 6.5–8.1)

## 2022-04-24 LAB — LACTIC ACID, PLASMA: Lactic Acid, Venous: 1.3 mmol/L (ref 0.5–1.9)

## 2022-04-24 MED ORDER — SODIUM CHLORIDE 0.9 % IV BOLUS
1000.0000 mL | Freq: Once | INTRAVENOUS | Status: AC
  Administered 2022-04-24: 1000 mL via INTRAVENOUS

## 2022-04-24 MED ORDER — GABAPENTIN 100 MG PO CAPS: 100.0000 mg | ORAL_CAPSULE | Freq: Three times a day (TID) | ORAL | 2 refills | Status: DC

## 2022-04-24 MED ORDER — GABAPENTIN 100 MG PO CAPS
100.0000 mg | ORAL_CAPSULE | Freq: Once | ORAL | Status: AC
  Administered 2022-04-24: 100 mg via ORAL
  Filled 2022-04-24: qty 1

## 2022-04-24 MED ORDER — KETOROLAC TROMETHAMINE 30 MG/ML IJ SOLN
30.0000 mg | Freq: Once | INTRAMUSCULAR | Status: AC
  Administered 2022-04-24: 30 mg via INTRAVENOUS
  Filled 2022-04-24: qty 1
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
## 2022-05-15 ENCOUNTER — Encounter: Payer: BC Managed Care – PPO | Attending: Physician Assistant | Admitting: Internal Medicine

## 2022-05-15 DIAGNOSIS — E11622 Type 2 diabetes mellitus with other skin ulcer: Secondary | ICD-10-CM | POA: Insufficient documentation

## 2022-05-15 DIAGNOSIS — L97818 Non-pressure chronic ulcer of other part of right lower leg with other specified severity: Secondary | ICD-10-CM | POA: Diagnosis not present

## 2022-05-15 DIAGNOSIS — M1990 Unspecified osteoarthritis, unspecified site: Secondary | ICD-10-CM | POA: Diagnosis not present

## 2022-05-15 DIAGNOSIS — I872 Venous insufficiency (chronic) (peripheral): Secondary | ICD-10-CM | POA: Insufficient documentation

## 2022-05-15 DIAGNOSIS — S81801A Unspecified open wound, right lower leg, initial encounter: Secondary | ICD-10-CM | POA: Diagnosis not present

## 2022-05-15 DIAGNOSIS — I2510 Atherosclerotic heart disease of native coronary artery without angina pectoris: Secondary | ICD-10-CM | POA: Diagnosis not present
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
## 2022-05-16 NOTE — Progress Notes (Signed)
[name], [name] ([id number]) [id number] Page 1 of 5 Visit Report for 05/15/2022 Abuse Risk Screen Details Patient Name: Date of Service: [place], [name] 05/15/2022 1:00 PM Medical Record Number: [id number] Patient Account Number: [id number] Date of Birth/Sex: Treating RN: [birth year] (61 y.o. [place] [facility] Primary Care Sadi Arave: Thereasa Distance Other Clinician: Referring Newt Levingston: Treating Shaune Malacara/Extender: Eldridge Dace, [place] EL Brynda Greathouse Weeks in Treatment: 0 Abuse Risk Screen Items Answer ABUSE RISK SCREEN: Has anyone close to you tried to hurt or harm you recentlyo No Do you feel uncomfortable with anyone in your familyo No Has anyone forced you do things that you didnt want to doo No Electronic Signature(s) Signed: 05/16/2022 8:19:10 AM By: Rosalio Loud MSN RN CNS WTA Entered By: Rosalio Loud on 05/15/2022 13:35:07 -------------------------------------------------------------------------------- Activities of Daily Living Details Patient Name: Date of Service: [name], [name] 05/15/2022 1:00 PM Medical Record Number: [id number] Patient Account Number: [id number] Date of Birth/Sex: Treating RN: 08-02-1960 (61 y.o. [place] [facility] Primary [facility] Tyson Masin: Thereasa Distance Other Clinician: Referring Emon Miggins: Treating Batsheva Stevick/Extender: Eldridge Dace, [place] EL Brynda Greathouse Weeks in Treatment: 0 Activities of Daily Living Items Answer Activities of Daily Living (Please select one for each item) Drive Automobile Completely Able [name] Medications ake Completely Able Use [name] elephone Completely Able Care for Appearance Completely Able Use [name] oilet Completely Able Bath / Shower Completely Able Dress Self Completely Able Feed Self Completely Able Walk Completely Able Get In / Out Bed Completely Able Housework Completely [name], [name] ([id number]) [phone] [id number] Page 2 of 5 Prepare Meals  Completely Able Handle Money Completely Able Shop for Self Completely Able Electronic Signature(s) Signed: 05/16/2022 8:19:10 AM By: Rosalio Loud MSN RN CNS WTA Entered By: Rosalio Loud on 05/15/2022 13:35:58 -------------------------------------------------------------------------------- Education Screening Details Patient Name: Date of Service: [name], [name] 05/15/2022 1:00 PM Medical Record Number: [id number] Patient Account Number: [id number] Date of Birth/Sex: Treating RN: 08/16/1960 (61 y.o. [place] [facility] Primary [facility] Gabryella Murfin: Thereasa Distance Other Clinician: Referring Hulon Ferron: Treating Alston Berrie/Extender: Eldridge Dace, [place] EL [name] in Treatment: 0 Primary Learner Assessed: Patient Learning Preferences/Education Level/Primary Language Learning Preference: Explanation, Demonstration Highest Education Level: High School Preferred Language: English Cognitive Barrier Language Barrier: No Translator Needed: No Memory Deficit: No Emotional Barrier: No Cultural/Religious Beliefs Affecting Medical Care: No Physical Barrier Impaired Vision: Yes Glasses Impaired Hearing: No Decreased Hand dexterity: No Knowledge/Comprehension Knowledge Level: High Comprehension Level: High Ability to understand written instructions: High Ability to understand verbal instructions: High Motivation Anxiety Level: Calm Cooperation: Cooperative Education Importance: Denies Need Interest in Health Problems: Asks Questions Perception: Coherent Willingness to Engage in Self-Management High Activities: Readiness to Engage in Self-Management High Activities: Electronic Signature(s) Signed: 05/16/2022 8:19:10 AM By: Rosalio Loud MSN RN CNS WTA Entered By: Rosalio Loud on 05/15/2022 14:31:08 [name] ([id number]) [id number] Page 3 of 5 -------------------------------------------------------------------------------- Fall Risk  Assessment Details Patient Name: Date of Service: [name], [name] 05/15/2022 1:00 PM Medical Record Number: [id number] Patient Account Number: [id number] Date of Birth/Sex: Treating RN: 01/26/1961 (61 y.o. [place] [facility] Primary Care Ossie Yebra: Thereasa Distance Other Clinician: Referring Riggin Cuttino: Treating Rudine Rieger/Extender: Eldridge Dace, [place] EL [name] in Treatment: 0 Fall Risk Assessment Items Have you had 2 or more falls in the last 12 monthso 0 No Have you had any fall that resulted in injury in the last 12 monthso 0 No FALLS RISK SCREEN History  of falling - immediate or within 3 months 0 No Secondary diagnosis (Do you have 2 or more medical diagnoseso) 0 No Ambulatory aid None/bed rest/wheelchair/nurse 0 Yes Crutches/cane/walker 0 No Furniture 0 No Intravenous therapy Access/Saline/Heparin Lock 0 No Gait/Transferring Normal/ bed rest/ wheelchair 0 Yes Weak (short steps with or without shuffle, stooped but able to lift head while walking, may seek 0 No support from furniture) Impaired (short steps with shuffle, may have difficulty arising from chair, head down, impaired 0 No balance) Mental Status Oriented to own ability 0 Yes Electronic Signature(s) Signed: 05/16/2022 8:19:10 AM By: Rosalio Loud MSN RN CNS WTA Entered By: Rosalio Loud on 05/15/2022 14:31:27 -------------------------------------------------------------------------------- Foot Assessment Details Patient Name: Date of Service: [name], [name] 05/15/2022 1:00 PM Medical Record Number: [id number] Patient Account Number: [id number] Date of Birth/Sex: Treating RN: 24-Mar-1961 (61 y.o. [place] [facility] Primary Care Charlesa Ehle: Thereasa Distance Other Clinician: Referring Cherly Erno: Treating Trecia Maring/Extender: Eldridge Dace, [place] EL [name] in Treatment: 0 Foot Assessment Items Site Locations [place], [place] [name] ([id number]) [id number] [id number] Page 4 of 5 +  = Sensation present, - = Sensation absent, C = Callus, U = Ulcer R = Redness, W = Warmth, M = Maceration, PU = Pre-ulcerative lesion F = Fissure, S = Swelling, D = Dryness Assessment Right: Left: Other Deformity: No No Prior Foot Ulcer: No No Prior Amputation: No No Charcot Joint: No No Ambulatory Status: Ambulatory Without Help Gait: Steady Electronic Signature(s) Signed: 05/16/2022 8:19:10 AM By: Rosalio Loud MSN RN CNS WTA Entered By: Rosalio Loud on 05/15/2022 14:32:54 -------------------------------------------------------------------------------- Nutrition Risk Screening Details Patient Name: Date of Service: [name], [name] 05/15/2022 1:00 PM Medical Record Number: [id number] Patient Account Number: [id number] Date of Birth/Sex: Treating RN: [birth year] (61 y.o. [place] [facility] Primary Care Jayleen Scaglione: Thereasa Distance Other Clinician: Referring Rand Boller: Treating Anju Sereno/Extender: [name] BSO Delane Ginger, MICHA EL Brynda Greathouse Weeks in Treatment: 0 Height (in): 69 Weight (lbs): 225 Body Mass Index (BMI): 33.2 Nutrition Risk Screening Items Score Screening NUTRITION RISK SCREEN: I have an illness or condition that made me change the kind and/or amount of food I eat 0 No I eat fewer than two meals per day 0 No I eat few fruits and vegetables, or milk products 0 No I have three or more drinks of beer, liquor or wine almost every day 0 No I have tooth or mouth problems that make it hard for me to eat 0 No I don'[name] always have enough money to buy the food I need 0 No [name], [name] ([id number]) [phone] [id number].pdf Page 5 of 5 I eat alone most of the time 0 No I take three or more different prescribed or over-the-counter drugs a day 1 Yes Without wanting to, I have lost or gained 10 pounds in the last six months 0 No I am not always physically able to shop, cook and/or feed myself 0 No Nutrition Protocols Good Risk Protocol 0 No interventions  needed Moderate Risk Protocol High Risk Proctocol Risk Level: Good Risk Score: 1 Electronic Signature(s) Signed: 05/16/2022 8:19:10 AM By: Rosalio Loud MSN RN CNS WTA Entered By: Rosalio Loud on 05/15/2022 14:32:13

## 2022-05-16 NOTE — Progress Notes (Addendum)
[name], [name] ([id number]) [id number] Page 1 of 10 Visit Report for 05/15/2022 Allergy List Details Patient Name: Date of Service: [name] 05/15/2022 1:00 PM Medical Record Number: [id number] Patient Account Number: [id number] Date of Birth/Sex: Treating RN: 1960-11-03 (61 y.o. Male) Rosalio Loud Primary Care Takoya Jonas: Thereasa Distance Other Clinician: Referring Todd Jelinski: Treating Jackey Housey/Extender: RO BSO N, MICHA EL Brynda Greathouse Weeks in Treatment: 0 Allergies Active Allergies No Known Allergies Allergy Notes Electronic Signature(s) Signed: 05/16/2022 8:19:10 AM By: Rosalio Loud MSN RN CNS WTA Entered By: Rosalio Loud on 05/15/2022 13:23:46 -------------------------------------------------------------------------------- Arrival Information Details Patient Name: Date of Service: [name] 05/15/2022 1:00 PM Medical Record Number: [id number] Patient Account Number: [id number] Date of Birth/Sex: Treating RN: 20-May-1961 (61 y.o. Male) Rosalio Loud Primary Care Zhi Geier: Thereasa Distance Other Clinician: Referring Dehlia Kilner: Treating Zhanae Proffit/Extender: RO BSO Delane Ginger, MICHA EL [name] in Treatment: 0 Visit Information Patient Arrived: Ambulatory Arrival Time: 13:15 Accompanied By: self Transfer Assistance: None Patient Identification Verified: Yes Secondary Verification Process Completed: Yes Patient Has Alerts: Yes Patient Alerts: TYPE II DIABETES Electronic Signature(s) Signed: 05/16/2022 8:19:10 AM By: Rosalio Loud MSN RN CNS WTA Entered By: Rosalio Loud on 05/15/2022 13:50:55 [name] ([id number]) [id number] Page 2 of 10 -------------------------------------------------------------------------------- Clinic Level of Care Assessment Details Patient Name: Date of Service: [name] 05/15/2022 1:00 PM Medical Record Number: [id number] Patient Account Number:  [id number] Date of Birth/Sex: Treating RN: 10-13-1960 (61 y.o. Male) Rosalio Loud Primary Care Marrianne Sica: Thereasa Distance Other Clinician: Referring Jacqulynn Shappell: Treating Dorse Locy/Extender: RO BSO N, MICHA EL Brynda Greathouse Weeks in Treatment: 0 Clinic Level of Care Assessment Items TOOL 2 Quantity Score X- 1 0 Use when only an EandM is performed on the INITIAL visit ASSESSMENTS - Nursing Assessment / Reassessment X- 1 20 General Physical Exam (combine w/ comprehensive assessment (listed just below) when performed on new pt. evals) X- 1 25 Comprehensive Assessment (HX, ROS, Risk Assessments, Wounds Hx, etc.) ASSESSMENTS - Wound and Skin A ssessment / Reassessment X - Simple Wound Assessment / Reassessment - one wound 1 5 '[]'$  - 0 Complex Wound Assessment / Reassessment - multiple wounds '[]'$  - 0 Dermatologic / Skin Assessment (not related to wound area) ASSESSMENTS - Ostomy and/or Continence Assessment and Care '[]'$  - 0 Incontinence Assessment and Management '[]'$  - 0 Ostomy Care Assessment and Management (repouching, etc.) PROCESS - Coordination of Care X - Simple Patient / Family Education for ongoing care 1 15 '[]'$  - 0 Complex (extensive) Patient / Family Education for ongoing care '[]'$  - 0 Staff obtains Programmer, systems, Records, [name] Results / Process Orders est '[]'$  - 0 Staff telephones HHA, Nursing Homes / Clarify orders / etc '[]'$  - 0 Routine Transfer to another Facility (non-emergent condition) '[]'$  - 0 Routine Hospital Admission (non-emergent condition) '[]'$  - 0 New Admissions / Biomedical engineer / Ordering NPWT Apligraf, etc. , '[]'$  - 0 Emergency Hospital Admission (emergent condition) X- 1 10 Simple Discharge Coordination '[]'$  - 0 Complex (extensive) Discharge Coordination PROCESS - Special Needs '[]'$  - 0 Pediatric / Minor Patient Management '[]'$  - 0 Isolation Patient Management '[]'$  - 0 Hearing / Language / Visual special needs '[]'$  - 0 Assessment of Community assistance  (transportation, D/C planning, etc.) '[]'$  - 0 Additional assistance / Altered mentation '[]'$  - 0 Support Surface(s) Assessment (bed, cushion, seat, etc.) INTERVENTIONS - Wound Cleansing / Measurement '[]'$  - 0 Wound Imaging (photographs - any number of wounds) [name] ([id number]) [id number]  Page 3 of 10 '[]'$  - 0 Wound Tracing (instead of photographs) X- 1 5 Simple Wound Measurement - one wound '[]'$  - 0 Complex Wound Measurement - multiple wounds X- 1 5 Simple Wound Cleansing - one wound '[]'$  - 0 Complex Wound Cleansing - multiple wounds INTERVENTIONS - Wound Dressings X - Small Wound Dressing one or multiple wounds 1 10 '[]'$  - 0 Medium Wound Dressing one or multiple wounds '[]'$  - 0 Large Wound Dressing one or multiple wounds '[]'$  - 0 Application of Medications - injection INTERVENTIONS - Miscellaneous '[]'$  - 0 External ear exam '[]'$  - 0 Specimen Collection (cultures, biopsies, blood, body fluids, etc.) '[]'$  - 0 Specimen(s) / Culture(s) sent or taken to Lab for analysis '[]'$  - 0 Patient Transfer (multiple staff / Civil Service fast streamer / Similar devices) '[]'$  - 0 Simple Staple / Suture removal (25 or less) '[]'$  - 0 Complex Staple / Suture removal (26 or more) '[]'$  - 0 Hypo / Hyperglycemic Management (close monitor of Blood Glucose) X- 1 15 Ankle / Brachial Index (ABI) - do not check if billed separately Has the patient been seen at the hospital within the last three years: Yes Total Score: 110 Level Of Care: New/Established - Level 3 Electronic Signature(s) Signed: 05/16/2022 8:19:10 AM By: Rosalio Loud MSN RN CNS WTA Entered By: Rosalio Loud on 05/15/2022 14:20:08 -------------------------------------------------------------------------------- Encounter Discharge Information Details Patient Name: Date of Service: [name] 05/15/2022 1:00 PM Medical Record Number: [id number] Patient Account Number: [id number] Date of Birth/Sex: Treating RN: Sep 16, 1960 (61  y.o. Male) Rosalio Loud Primary Care [name]: Thereasa Distance Other Clinician: Referring Magdaleno Lortie: Treating Farron Watrous/Extender: RO BSO Delane Ginger, MICHA EL [name] in Treatment: 0 Encounter Discharge Information Items Discharge Condition: Stable Ambulatory Status: Ambulatory Discharge Destination: Home Transportation: Private Auto Accompanied By: self Schedule Follow-up Appointment: Yes Clinical Summary of Care: Provided Form Type Recipient Paper Patient patient [name], [name] ([id number]) [id number] Page 4 of 10 Electronic Signature(s) Signed: 05/16/2022 8:19:10 AM By: Rosalio Loud MSN RN CNS WTA Entered By: Rosalio Loud on 05/15/2022 14:22:38 -------------------------------------------------------------------------------- Lower Extremity Assessment Details Patient Name: Date of Service: [name] LDWELL, [name]. 05/15/2022 1:00 PM Medical Record Number: [id number] Patient Account Number: [id number] Date of Birth/Sex: Treating RN: 11/16/1960 (61 y.o. Male) Rosalio Loud Primary Care Najmah Carradine: Thereasa Distance Other Clinician: Referring Anees Vanecek: Treating Adolphe Fortunato/Extender: RO BSO Delane Ginger, MICHA EL Brynda Greathouse Weeks in Treatment: 0 Edema Assessment Assessed: [Left: No] [Right: Yes] [Left: Edema] [Right: :] Calf Left: Right: Point of Measurement: 30 cm From Medial Instep 41.5 cm Ankle Left: Right: Point of Measurement: 11 cm From Medial Instep 23.5 cm Vascular Assessment Pulses: Dorsalis Pedis Palpable: [Right:Yes] Doppler Audible: [Right:Yes] Posterior Tibial Palpable: [Right:Yes] Doppler Audible: [Right:Yes] Blood Pressure: Brachial: [Right:122] Ankle: [Right:Posterior Tibial: 132 1.08] Electronic Signature(s) Signed: 05/16/2022 8:19:10 AM By: Rosalio Loud MSN RN CNS WTA Entered By: Rosalio Loud on 05/15/2022 13:50:20 Multi Wound Chart  Details -------------------------------------------------------------------------------- [name] ([id number]) [id number] Page 5 of 10 Patient Name: Date of Service: [place], [name]. 05/15/2022 1:00 PM Medical Record Number: [id number] Patient Account Number: [id number] Date of Birth/Sex: Treating RN: [birth year] (61 y.o. Male) Rosalio Loud Primary Care Mahmood Boehringer: Thereasa Distance Other Clinician: Referring Nicklas Mcsweeney: Treating Nareh Matzke/Extender: RO BSO N, MICHA EL Brynda Greathouse Weeks in Treatment: 0 Vital Signs Height(in): 69 Pulse(bpm): 102 Weight(lbs): 225 Blood Pressure(mmHg): 140/89 Body Mass Index(BMI): 33.2 Temperature(F): 97.9 Respiratory Rate(breaths/min): 16 Wound Assessments Wound Number: 1 N/A N/A Photos: No Photos N/A N/A Right,  Medial Lower Leg N/A N/A Wound Location: Thermal Burn N/A N/A Wounding Event: 2nd degree Burn N/A N/A Primary Etiology: 12/03/2021 N/A N/A Date Acquired: 0 N/A N/A Weeks of Treatment: Open N/A N/A Wound Status: No N/A N/A Wound Recurrence: 10x8.2x0.1 N/A N/A Measurements L x W x D (cm) 64.403 N/A N/A A (cm) : rea 6.44 N/A N/A Volume (cm) : Treatment Notes Wound #1 (Lower Leg) Wound Laterality: Right, Medial Cleanser Soap and Water Discharge Instruction: Gently cleanse wound with antibacterial soap, rinse and pat dry prior to dressing wounds Peri-Wound Care Ketoconizole Topical Primary Dressing Secondary Dressing ABD Pad 5x9 (in/in) Discharge Instruction: Cover with ABD pad Secured With Conform 4'' - Conforming Stretch Gauze Bandage 4x75 (in/in) Discharge Instruction: Apply as directed Tubigrip Size D, 3x10 (in/yd) Compression Wrap Compression Stockings Add-Ons Electronic Signature(s) Signed: 05/15/2022 4:38:50 PM By: Linton Ham MD Entered By: Linton Ham on 05/15/2022 16:20:35 [name] ([id number]) [id number] Page 6 of  10 -------------------------------------------------------------------------------- Multi-Disciplinary Care Plan Details Patient Name: Date of Service: [place], [name]. 05/15/2022 1:00 PM Medical Record Number: [id number] Patient Account Number: [id number] Date of Birth/Sex: Treating RN: April 27, 1961 (61 y.o. Male) Rosalio Loud Primary Care Ahriyah Vannest: Thereasa Distance Other Clinician: Referring Alfhild Partch: Treating Janani Chamber/Extender: RO BSO Delane Ginger, MICHA EL [name] in Treatment: 0 Active Inactive Orientation to the Wound Care Program Nursing Diagnoses: Knowledge deficit related to the wound healing center program Goals: Patient/caregiver will verbalize understanding of the [place] Program Date Initiated: 05/15/2022 Target Resolution Date: 05/15/2022 Goal Status: Active Interventions: Provide education on orientation to the wound center Notes: Venous Leg Ulcer Nursing Diagnoses: Knowledge deficit related to disease process and management Potential for venous Insuffiency (use before diagnosis confirmed) Goals: Patient will maintain optimal edema control Date Initiated: 05/15/2022 Target Resolution Date: 06/15/2022 Goal Status: Active Patient/caregiver will verbalize understanding of disease process and disease management Date Initiated: 05/15/2022 Target Resolution Date: 05/15/2022 Goal Status: Active Verify adequate tissue perfusion prior to therapeutic compression application Date Initiated: 05/15/2022 Target Resolution Date: 05/15/2022 Goal Status: Active Interventions: Assess peripheral edema status every visit. Compression as ordered Provide education on venous insufficiency Treatment Activities: Therapeutic compression applied : 05/15/2022 Notes: Wound/Skin Impairment Nursing Diagnoses: Impaired tissue integrity Knowledge deficit related to smoking impact on wound healing Knowledge deficit related to ulceration/compromised skin  integrity Goals: Patient/caregiver will verbalize understanding of skin care regimen Date Initiated: 05/15/2022 Target Resolution Date: 05/15/2022 Goal Status: Active Ulcer/skin breakdown will have a volume reduction of 30% by week 4 Date Initiated: 05/15/2022 Target Resolution Date: 06/12/2022 Goal Status: Active Interventions: Assess patient/caregiver ability to obtain necessary supplies Assess patient/caregiver ability to perform ulcer/skin care regimen upon admission and as needed Assess ulceration(s) every visit Provide education on smoking Provide education on ulcer and skin care [name], [name] ([id number]) [id number] Page 7 of 10 Treatment Activities: Referred to DME Nickol Collister for dressing supplies : 05/15/2022 Skin care regimen initiated : 05/15/2022 Topical wound management initiated : 05/15/2022 Notes: Electronic Signature(s) Signed: 05/16/2022 8:19:10 AM By: Rosalio Loud MSN RN CNS WTA Entered By: Rosalio Loud on 05/15/2022 14:12:41 -------------------------------------------------------------------------------- Pain Assessment Details Patient Name: Date of Service: [name] 05/15/2022 1:00 PM Medical Record Number: [id number] Patient Account Number: [id number] Date of Birth/Sex: Treating RN: June 06, 1961 (61 y.o. Male) Rosalio Loud Primary Care Maleko Greulich: Thereasa Distance Other Clinician: Referring Ameir Faria: Treating Liannah Yarbough/Extender: RO BSO N, [place] EL Brynda Greathouse Weeks in Treatment: 0 Active Problems Location of Pain Severity and Description of Pain Patient Has  Paino Yes Site Locations Pain Location: Pain in Ulcers Rate the pain. Current Pain Level: 4 Character of Pain Describe the Pain: Burning Pain Management and Medication Current Pain Management: Electronic Signature(s) Signed: 05/16/2022 8:19:10 AM By: Rosalio Loud MSN RN CNS WTA Entered By: Rosalio Loud on 05/15/2022 13:41:31 [name], [name]  ([id number]) [id number] Page 8 of 10 -------------------------------------------------------------------------------- Patient/Caregiver Education Details Patient Name: Date of Service: [place], [place] 10/25/2023andnbsp1:00 PM Medical Record Number: [id number] Patient Account Number: [id number] Date of Birth/Gender: Treating RN: 09/15/1960 (61 y.o. Male) Rosalio Loud Primary Care Physician: Thereasa Distance Other Clinician: Referring Physician: Treating Physician/Extender: RO BSO Delane Ginger, [place] EL [name] in Treatment: 0 Education Assessment Education Provided To: Patient Education Topics Provided Smoking and Wound Healing: Handouts: Smoking and Wound Healing Methods: Explain/Verbal Responses: State content correctly Venous: Handouts: Controlling Swelling with Compression Stockings Methods: Explain/Verbal Responses: State content correctly Welcome [name] [employer] [place]: o Handouts: Welcome [name] [facility] [place] o Methods: Explain/Verbal Responses: State content correctly Wound/Skin Impairment: Handouts: Caring for Your Ulcer Methods: Explain/Verbal Responses: State content correctly Electronic Signature(s) Signed: 05/16/2022 8:19:10 AM By: Rosalio Loud MSN RN CNS WTA Entered By: Rosalio Loud on 05/15/2022 14:21:07 -------------------------------------------------------------------------------- Wound Assessment Details Patient Name: Date of Service: [name] 05/15/2022 1:00 PM Medical Record Number: [id number] Patient Account Number: [id number] Date of Birth/Sex: Treating RN: 1961-05-15 (61 y.o. Male) Rosalio Loud Primary Care Tynika Luddy: Thereasa Distance Other Clinician: Referring Zaylin Runco: Treating Arcangel Minion/Extender: Eldridge Dace, MICHA EL [name] in Treatment: 0 [name], [name] ([id number]) [id number] Page 9 of 10 Wound Status Wound Number: 1 Primary 2nd degree  Burn Etiology: Wound Location: Right, Medial Lower Leg Wound Open Wounding Event: Thermal Burn Status: Date Acquired: 12/03/2021 Comorbid Chronic sinus problems/congestion, Chronic Obstructive Pulmonary Weeks Of Treatment: 0 History: Disease (COPD), Coronary Artery Disease, Type II Diabetes, Clustered Wound: No History of Burn Photos Wound Measurements Length: (cm) Width: (cm) Depth: (cm) Area: (cm) Volume: (cm) 10 % Reduction in Area: 8.2 % Reduction in Volume: 0.1 64.403 6.44 Wound Description Classification: Partial Thickness Exudate Amount: Large Exudate Type: Serous Exudate Color: amber Foul Odor After Cleansing: No Slough/Fibrino No Wound Bed Granulation Amount: Large (67-100%) Exposed Structure Necrotic Amount: None Present (0%) Fascia Exposed: No Fat Layer (Subcutaneous Tissue) Exposed: No Tendon Exposed: No Muscle Exposed: No Joint Exposed: No Bone Exposed: No Treatment Notes Wound #1 (Lower Leg) Wound Laterality: Right, Medial Cleanser Soap and Water Discharge Instruction: Gently cleanse wound with antibacterial soap, rinse and pat dry prior to dressing wounds Peri-Wound Care [place]iz[place] Topical Primary Dressing Secondary Dressing ABD Pad 5x9 (in/in) Discharge Instruction: Cover with ABD pad Secured With Conform 4'' - Conforming Stretch Gauze Bandage 4x75 (in/in) Discharge Instruction: Apply as directed Tubigrip Size D, 3x10 (in/yd) Compression Wrap Compression Stockings Add-Ons [name], [name] ([id number]) [id number] Page 10 of 10 Electronic Signature(s) Signed: 05/17/2022 11:21:48 AM By: Gretta Cool, BSN, RN, CWS, Kim RN, BSN Signed: 05/17/2022 12:03:42 PM By: Rosalio Loud MSN RN CNS WTA Previous Signature: 05/16/2022 8:19:10 AM Version By: Rosalio Loud MSN RN CNS WTA Entered By: Gretta Cool, BSN, RN, CWS, Kim on 05/17/2022 11:21:48 -------------------------------------------------------------------------------- Vitals  Details Patient Name: Date of Service: [name] 05/15/2022 1:00 PM Medical Record Number: [id number] Patient Account Number: [id number] Date of Birth/Sex: Treating RN: [birth year] (61 y.o. Male) Rosalio Loud Primary Care Alquan Morrish: Thereasa Distance Other Clinician: Referring Telma Pyeatt: Treating Starla Deller/Extender: RO BSO Delane Ginger, MICHA EL Brynda Greathouse Weeks in Treatment:  0 Vital Signs Time Taken: 13:16 Temperature (F): 97.9 Height (in): 69 Pulse (bpm): 73 Source: Stated Respiratory Rate (breaths/min): 16 Weight (lbs): 225 Blood Pressure (mmHg): 140/89 Source: Stated Reference Range: 80 - 120 mg / dl Body Mass Index (BMI): 33.2 Electronic Signature(s) Signed: 05/16/2022 8:19:10 AM By: Rosalio Loud MSN RN CNS WTA Entered By: Rosalio Loud on 05/15/2022 13:19:00

## 2022-05-22 ENCOUNTER — Encounter: Payer: BC Managed Care – PPO | Attending: Internal Medicine | Admitting: Internal Medicine

## 2022-05-22 ENCOUNTER — Other Ambulatory Visit (HOSPITAL_BASED_OUTPATIENT_CLINIC_OR_DEPARTMENT_OTHER): Payer: Self-pay | Admitting: Internal Medicine

## 2022-05-22 DIAGNOSIS — E11622 Type 2 diabetes mellitus with other skin ulcer: Secondary | ICD-10-CM | POA: Diagnosis not present

## 2022-05-22 DIAGNOSIS — E1151 Type 2 diabetes mellitus with diabetic peripheral angiopathy without gangrene: Secondary | ICD-10-CM | POA: Diagnosis not present

## 2022-05-22 DIAGNOSIS — I2510 Atherosclerotic heart disease of native coronary artery without angina pectoris: Secondary | ICD-10-CM | POA: Diagnosis not present

## 2022-05-22 DIAGNOSIS — L97818 Non-pressure chronic ulcer of other part of right lower leg with other specified severity: Secondary | ICD-10-CM | POA: Insufficient documentation

## 2022-05-22 DIAGNOSIS — B354 Tinea corporis: Secondary | ICD-10-CM | POA: Diagnosis not present

## 2022-05-22 DIAGNOSIS — I10 Essential (primary) hypertension: Secondary | ICD-10-CM | POA: Diagnosis not present

## 2022-05-22 DIAGNOSIS — I872 Venous insufficiency (chronic) (peripheral): Secondary | ICD-10-CM | POA: Insufficient documentation

## 2022-05-22 DIAGNOSIS — T24231A Burn of second degree of right lower leg, initial encounter: Secondary | ICD-10-CM | POA: Diagnosis not present

## 2022-05-22 DIAGNOSIS — L98499 Non-pressure chronic ulcer of skin of other sites with unspecified severity: Secondary | ICD-10-CM | POA: Diagnosis not present

## 2022-05-22 LAB — GLUCOSE, CAPILLARY: Glucose-Capillary: 131 mg/dL — ABNORMAL HIGH (ref 70–99)
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
## 2022-05-30 ENCOUNTER — Encounter: Payer: BC Managed Care – PPO | Admitting: Internal Medicine

## 2022-05-30 DIAGNOSIS — I872 Venous insufficiency (chronic) (peripheral): Secondary | ICD-10-CM | POA: Diagnosis not present

## 2022-05-30 DIAGNOSIS — E1151 Type 2 diabetes mellitus with diabetic peripheral angiopathy without gangrene: Secondary | ICD-10-CM | POA: Diagnosis not present

## 2022-05-30 DIAGNOSIS — I2510 Atherosclerotic heart disease of native coronary artery without angina pectoris: Secondary | ICD-10-CM | POA: Diagnosis not present

## 2022-05-30 DIAGNOSIS — B354 Tinea corporis: Secondary | ICD-10-CM | POA: Diagnosis not present

## 2022-05-30 DIAGNOSIS — L97818 Non-pressure chronic ulcer of other part of right lower leg with other specified severity: Secondary | ICD-10-CM | POA: Diagnosis not present

## 2022-05-30 DIAGNOSIS — E11622 Type 2 diabetes mellitus with other skin ulcer: Secondary | ICD-10-CM | POA: Diagnosis not present

## 2022-05-30 DIAGNOSIS — I10 Essential (primary) hypertension: Secondary | ICD-10-CM | POA: Diagnosis not present

## 2022-05-30 DIAGNOSIS — S81801A Unspecified open wound, right lower leg, initial encounter: Secondary | ICD-10-CM | POA: Diagnosis not present

## 2022-05-30 LAB — SURGICAL PATHOLOGY
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
## 2022-05-31 DIAGNOSIS — S81801A Unspecified open wound, right lower leg, initial encounter: Secondary | ICD-10-CM | POA: Diagnosis not present

## 2022-06-06 ENCOUNTER — Encounter: Payer: BC Managed Care – PPO | Admitting: Physician Assistant

## 2022-06-06 DIAGNOSIS — I10 Essential (primary) hypertension: Secondary | ICD-10-CM | POA: Diagnosis not present

## 2022-06-06 DIAGNOSIS — L97818 Non-pressure chronic ulcer of other part of right lower leg with other specified severity: Secondary | ICD-10-CM | POA: Diagnosis not present

## 2022-06-06 DIAGNOSIS — S81801A Unspecified open wound, right lower leg, initial encounter: Secondary | ICD-10-CM | POA: Diagnosis not present

## 2022-06-06 DIAGNOSIS — E1151 Type 2 diabetes mellitus with diabetic peripheral angiopathy without gangrene: Secondary | ICD-10-CM | POA: Diagnosis not present

## 2022-06-06 DIAGNOSIS — I872 Venous insufficiency (chronic) (peripheral): Secondary | ICD-10-CM | POA: Diagnosis not present

## 2022-06-06 DIAGNOSIS — B354 Tinea corporis: Secondary | ICD-10-CM | POA: Diagnosis not present

## 2022-06-06 DIAGNOSIS — E11622 Type 2 diabetes mellitus with other skin ulcer: Secondary | ICD-10-CM | POA: Diagnosis not present

## 2022-06-06 DIAGNOSIS — I2510 Atherosclerotic heart disease of native coronary artery without angina pectoris: Secondary | ICD-10-CM | POA: Diagnosis not present
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
## 2022-06-18 ENCOUNTER — Ambulatory Visit: Payer: BC Managed Care – PPO | Admitting: Physician Assistant

## 2022-06-20 ENCOUNTER — Encounter: Payer: BC Managed Care – PPO | Admitting: Physician Assistant

## 2022-06-20 DIAGNOSIS — I872 Venous insufficiency (chronic) (peripheral): Secondary | ICD-10-CM | POA: Diagnosis not present

## 2022-06-20 DIAGNOSIS — L97812 Non-pressure chronic ulcer of other part of right lower leg with fat layer exposed: Secondary | ICD-10-CM | POA: Diagnosis not present

## 2022-06-20 DIAGNOSIS — I2510 Atherosclerotic heart disease of native coronary artery without angina pectoris: Secondary | ICD-10-CM | POA: Diagnosis not present

## 2022-06-20 DIAGNOSIS — E1151 Type 2 diabetes mellitus with diabetic peripheral angiopathy without gangrene: Secondary | ICD-10-CM | POA: Diagnosis not present

## 2022-06-20 DIAGNOSIS — E11622 Type 2 diabetes mellitus with other skin ulcer: Secondary | ICD-10-CM | POA: Diagnosis not present

## 2022-06-20 DIAGNOSIS — I10 Essential (primary) hypertension: Secondary | ICD-10-CM | POA: Diagnosis not present

## 2022-06-20 DIAGNOSIS — B354 Tinea corporis: Secondary | ICD-10-CM | POA: Diagnosis not present

## 2022-06-20 DIAGNOSIS — L97818 Non-pressure chronic ulcer of other part of right lower leg with other specified severity: Secondary | ICD-10-CM | POA: Diagnosis not present
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
## 2022-07-04 ENCOUNTER — Encounter: Payer: BC Managed Care – PPO | Attending: Physician Assistant | Admitting: Physician Assistant

## 2022-07-04 DIAGNOSIS — Z8679 Personal history of other diseases of the circulatory system: Secondary | ICD-10-CM | POA: Insufficient documentation

## 2022-07-04 DIAGNOSIS — E11622 Type 2 diabetes mellitus with other skin ulcer: Secondary | ICD-10-CM | POA: Insufficient documentation

## 2022-07-04 DIAGNOSIS — J449 Chronic obstructive pulmonary disease, unspecified: Secondary | ICD-10-CM | POA: Insufficient documentation

## 2022-07-04 DIAGNOSIS — E785 Hyperlipidemia, unspecified: Secondary | ICD-10-CM | POA: Diagnosis not present

## 2022-07-04 DIAGNOSIS — I10 Essential (primary) hypertension: Secondary | ICD-10-CM | POA: Insufficient documentation

## 2022-07-04 DIAGNOSIS — I872 Venous insufficiency (chronic) (peripheral): Secondary | ICD-10-CM | POA: Insufficient documentation

## 2022-07-04 DIAGNOSIS — L97818 Non-pressure chronic ulcer of other part of right lower leg with other specified severity: Secondary | ICD-10-CM | POA: Diagnosis not present

## 2022-07-04 DIAGNOSIS — S81801A Unspecified open wound, right lower leg, initial encounter: Secondary | ICD-10-CM | POA: Diagnosis not present

## 2022-07-04 DIAGNOSIS — K219 Gastro-esophageal reflux disease without esophagitis: Secondary | ICD-10-CM | POA: Diagnosis not present
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
## 2022-07-23 ENCOUNTER — Other Ambulatory Visit: Payer: Self-pay

## 2022-07-23 ENCOUNTER — Emergency Department: Payer: BC Managed Care – PPO

## 2022-07-23 ENCOUNTER — Encounter: Payer: Self-pay | Admitting: *Deleted

## 2022-07-23 DIAGNOSIS — J449 Chronic obstructive pulmonary disease, unspecified: Secondary | ICD-10-CM | POA: Insufficient documentation

## 2022-07-23 DIAGNOSIS — U071 COVID-19: Secondary | ICD-10-CM | POA: Diagnosis not present

## 2022-07-23 DIAGNOSIS — R000 Tachycardia, unspecified: Secondary | ICD-10-CM | POA: Diagnosis not present

## 2022-07-23 DIAGNOSIS — R059 Cough, unspecified: Secondary | ICD-10-CM | POA: Diagnosis not present

## 2022-07-23 LAB — BASIC METABOLIC PANEL
Anion gap: 11 (ref 5–15)
BUN: 13 mg/dL (ref 6–20)
CO2: 24 mmol/L (ref 22–32)
Calcium: 9.4 mg/dL (ref 8.9–10.3)
Chloride: 103 mmol/L (ref 98–111)
Creatinine, Ser: 1.04 mg/dL (ref 0.61–1.24)
GFR, Estimated: >60 mL/min (ref >60)
Glucose, Bld: 142 mg/dL — ABNORMAL HIGH (ref 70–99)
Potassium: 4.3 mmol/L (ref 3.5–5.1)
Sodium: 138 mmol/L (ref 135–145)

## 2022-07-23 LAB — RESP PANEL BY RT-PCR (RSV, FLU A&B, COVID)  RVPGX2
Influenza A by PCR: NEGATIVE
Influenza B by PCR: NEGATIVE
Resp Syncytial Virus by PCR: NEGATIVE
SARS Coronavirus 2 by RT PCR: POSITIVE — AB

## 2022-07-23 LAB — CBC
HCT: 50.7 % (ref 39.0–52.0)
Hemoglobin: 16.9 g/dL (ref 13.0–17.0)
MCH: 31.5 pg (ref 26.0–34.0)
MCHC: 33.3 g/dL (ref 30.0–36.0)
MCV: 94.6 fL (ref 80.0–100.0)
Platelets: 294 10*3/uL (ref 150–400)
RBC: 5.36 MIL/uL (ref 4.22–5.81)
RDW: 13.7 % (ref 11.5–15.5)
WBC: 9.1 10*3/uL (ref 4.0–10.5)
nRBC: 0 % (ref 0.0–0.2)

## 2022-07-23 LAB — TROPONIN I (HIGH SENSITIVITY): Troponin I (High Sensitivity): 6 ng/L (ref <18)

## 2022-07-23 NOTE — ED Provider Triage Note (Signed)
Emergency Medicine Provider Triage Evaluation Note  ASON HESLIN , a 62 y.o. male  was evaluated in triage.  Pt complains of covid exposure with cough and sob.  Review of Systems  Positive:  Negative:   Physical Exam  BP (!) 149/86 (BP Location: Left Arm)   Pulse (!) 130   Temp 98 F (36.7 C) (Oral)   Resp 20   Ht '5\' 8"'$  (1.727 m)   Wt 104.3 kg   SpO2 97%   BMI 34.97 kg/m  Gen:   Awake, no distress   Resp:  Normal effort  MSK:   Moves extremities without difficulty  Other:    Medical Decision Making  Medically screening exam initiated at 6:26 PM.  Appropriate orders placed.  Jodi Mourning was informed that the remainder of the evaluation will be completed by another provider, this initial triage assessment does not replace that evaluation, and the importance of remaining in the ED until their evaluation is complete.     Versie Starks, PA-C 07/23/22 1826

## 2022-07-23 NOTE — ED Triage Notes (Signed)
Pt reports covid exposure last week.  Pt has a cough, runny nose and congestion.  Pt taking otc meds with some relief.  Pt alert  speech clear.

## 2022-07-24 ENCOUNTER — Emergency Department
Admission: EM | Admit: 2022-07-24 | Discharge: 2022-07-24 | Disposition: A | Discharge status: Home / Self Care | Payer: BC Managed Care – PPO | Attending: Emergency Medicine | Admitting: Emergency Medicine

## 2022-07-24 DIAGNOSIS — U071 COVID-19: Secondary | ICD-10-CM

## 2022-07-24 MED ORDER — KETOROLAC TROMETHAMINE 30 MG/ML IJ SOLN
15.0000 mg | Freq: Once | INTRAMUSCULAR | Status: AC
  Administered 2022-07-24: 15 mg via INTRAVENOUS
  Filled 2022-07-24: qty 1

## 2022-07-24 MED ORDER — SODIUM CHLORIDE 0.9 % IV BOLUS
1000.0000 mL | Freq: Once | INTRAVENOUS | Status: AC
  Administered 2022-07-24: 1000 mL via INTRAVENOUS

## 2022-07-24 MED ORDER — ACETAMINOPHEN 500 MG PO TABS
1000.0000 mg | ORAL_TABLET | Freq: Once | ORAL | Status: AC
  Administered 2022-07-24: 1000 mg via ORAL
  Filled 2022-07-24: qty 2

## 2022-07-24 MED ORDER — NIRMATRELVIR/RITONAVIR (PAXLOVID)TABLET: 3.0000 | ORAL_TABLET | Freq: Two times a day (BID) | ORAL | 0 refills | Status: AC

## 2022-07-24 NOTE — ED Notes (Signed)
Pt DC to home. IV removed, cath intact, pressure dressing applied. No bleeding noted at site. Pt ambulatory out of dept with steady gait

## 2022-07-24 NOTE — ED Provider Notes (Signed)
[facility] Provider Note    Event Date/Time   First MD Initiated Contact with Patient 07/24/22 [phone]     (approximate)   History   Covid Exposure   HPI  [name] is a 62 y.o. male   Past medical history of smoker, mild COPD, hyperlipidemia, kidney stones, hypertension presents to the emergency department with several days of headache, chills, cough, nasal congestion with known COVID exposure.  No COVID vaccinations.  No chest pain or shortness of breath.  No fever.    No other acute medical complaints.  This history was obtained by the patient. I reviewed external medical notes including a June 2022 emergency department visit when he was diagnosed with COVID     Physical Exam   Triage Vital Signs: ED Triage Vitals  Enc Vitals Group     BP 07/23/22 1819 (!) 149/86     Pulse Rate 07/23/22 1819 (!) 130     Resp 07/23/22 1819 20     Temp 07/23/22 1819 98 F (36.7 C)     Temp Source 07/23/22 1819 Oral     SpO2 07/23/22 1819 97 %     Weight 07/23/22 1817 230 lb (104.3 kg)     Height 07/23/22 1817 '5\' 8"'$  (1.727 m)     Head Circumference --      Peak Flow --      Pain Score 07/23/22 1817 5     Pain Loc --      Pain Edu? --      Excl. in [place]? --     Most recent vital signs: Vitals:   07/24/22 0203 07/24/22 0503  BP: 128/79 138/62  Pulse: (!) 122 90  Resp: 18 19  Temp: 97.8 F (36.6 C) 98.2 F (36.8 C)  SpO2: 94% 98%    General: Awake, no distress.  CV:  Good peripheral perfusion.  Resp:  Normal effort.  Abd:  No distention.  Other:  Initially tachycardic 130s, resolved to 90s on my examination.  Otherwise no hypoxemia no respiratory distress lungs clear to auscultation skin appears warm well-perfused he appears comfortable nontoxic.   ED Results / Procedures / Treatments   Labs (all labs ordered are listed, but only abnormal results are displayed) Labs Reviewed  RESP PANEL BY RT-PCR (RSV, FLU A&B, COVID)  [id number] -  Abnormal; Notable for the following components:      Result Value   SARS Coronavirus 2 by RT PCR POSITIVE (*)    All other components within normal limits  BASIC METABOLIC PANEL - Abnormal; Notable for the following components:   Glucose, Bld 142 (*)    All other components within normal limits  CBC  TROPONIN I (HIGH SENSITIVITY)     I reviewed labs and they are notable for COVID-positive.  EKG  ED ECG REPORT I, Lucillie Garfinkel, the attending physician, personally viewed and interpreted this ECG.   Date: 07/24/2022  EKG Time: 1822  Rate: 133  Rhythm: sinus tachycardia  Axis: nl  Intervals:none  ST&T Change: No acute ischemic changes    RADIOLOGY I independently reviewed and interpreted chest x-ray and see no obvious focalities or pneumothorax   PROCEDURES:  Critical Care performed: No  Procedures   MEDICATIONS ORDERED IN ED: Medications  ketorolac (TORADOL) 30 MG/ML injection 15 mg (15 mg Intravenous Given 07/24/22 0429)  acetaminophen (TYLENOL) tablet 1,000 mg (1,000 mg Oral Given 07/24/22 0429)  sodium chloride 0.9 % bolus 1,000 mL (0 mLs Intravenous Stopped  07/24/22 0504)    IMPRESSION / MDM / ASSESSMENT AND PLAN / ED COURSE  I reviewed the triage vital signs and the nursing notes.                              Differential diagnosis includes, but is not limited to, viral URI, bacterial pneumonia, sepsis or meningitis   MDM: Patient with viral URI symptoms and tested positive for COVID.  He has risk factors including COPD smoker elevated BMI and age, however he is not hypoxemic and does not appear to be toxic or in respiratory distress and his tachycardia resolved with some fluids now 90s, I prescribed him some Paxlovid, anticipatory guidance and given strict return precautions.    Patient's presentation is most consistent with acute presentation with potential threat to life or bodily function.       FINAL CLINICAL IMPRESSION(S) / ED DIAGNOSES   Final  diagnoses:  COVID     Rx / DC Orders   ED Discharge Orders          Ordered    nirmatrelvir/ritonavir (PAXLOVID) 20 x 150 MG & 10 x '100MG'$  TABS  2 times daily        07/24/22 0458             Note:  This document was prepared using Dragon voice recognition software and may include unintentional dictation errors.    Lucillie Garfinkel, MD 07/24/22 [phone]

## 2022-07-24 NOTE — Discharge Instructions (Signed)
Take medication as prescribed.  Take acetaminophen 650 mg and ibuprofen 400 mg every 6 hours for pain.  Take with food.  Thank you for choosing [place] for your health care today!  Please see your primary doctor this week for a follow up appointment.   Sometimes, in the early stages of certain disease courses it is difficult to detect in the emergency department evaluation -- so, it is important that you continue to monitor your symptoms and call your doctor right away or return to the emergency department if you develop any new or worsening symptoms.  Please go to the following website to schedule new (and existing) patient appointments:   [email or web address]  If you do not have a primary doctor try calling the following clinics to establish care:  If you have insurance:  [facility] [phone] [place] [address]   [facility]  [phone] [address] [place] [address]   If you do not have insurance:  [facility]  [phone] [address], [place] [address]   The following is another list of primary care offices in the area who are accepting new patients at this time.  Please reach out to one of them directly and let them know you would like to schedule an appointment to follow up on an Emergency Department visit, and/or to establish a new primary care provider (PCP).  There are likely other primary care clinics in the are who are accepting new patients, but this is an excellent place to start:  [place] physician: Dr Lavon Paganini [address] [place], [place] [address] [phone]  [facility] Lead Physician: Dr Steele Sizer [address], [place], [place] [address] [phone]  [place] Physician: Dr Park Liter [address] [place], [place] [address] [phone]  [facility] Lead Physician: Dr Dewaine Oats [place], [place], [place] [address] [phone]  [facility] Physician: Dr Halina Maidens [address] [place], [place] [address] [phone]   It was my pleasure to care for you today.   Hoover Brunette Jacelyn Grip, MD

## 2022-08-22 ENCOUNTER — Other Ambulatory Visit: Payer: Self-pay

## 2022-08-22 ENCOUNTER — Emergency Department: Payer: BC Managed Care – PPO

## 2022-08-22 ENCOUNTER — Emergency Department
Admission: EM | Admit: 2022-08-22 | Discharge: 2022-08-22 | Disposition: A | Discharge status: Home / Self Care | Payer: BC Managed Care – PPO | Attending: Emergency Medicine | Admitting: Emergency Medicine

## 2022-08-22 DIAGNOSIS — R1032 Left lower quadrant pain: Secondary | ICD-10-CM | POA: Diagnosis not present

## 2022-08-22 DIAGNOSIS — N201 Calculus of ureter: Secondary | ICD-10-CM | POA: Diagnosis not present

## 2022-08-22 DIAGNOSIS — E119 Type 2 diabetes mellitus without complications: Secondary | ICD-10-CM | POA: Insufficient documentation

## 2022-08-22 DIAGNOSIS — I7143 Infrarenal abdominal aortic aneurysm, without rupture: Secondary | ICD-10-CM

## 2022-08-22 DIAGNOSIS — J449 Chronic obstructive pulmonary disease, unspecified: Secondary | ICD-10-CM | POA: Diagnosis not present

## 2022-08-22 DIAGNOSIS — I10 Essential (primary) hypertension: Secondary | ICD-10-CM | POA: Insufficient documentation

## 2022-08-22 DIAGNOSIS — I714 Abdominal aortic aneurysm, without rupture, unspecified: Secondary | ICD-10-CM | POA: Diagnosis not present

## 2022-08-22 DIAGNOSIS — N281 Cyst of kidney, acquired: Secondary | ICD-10-CM | POA: Diagnosis not present

## 2022-08-22 DIAGNOSIS — N200 Calculus of kidney: Secondary | ICD-10-CM | POA: Diagnosis not present

## 2022-08-22 DIAGNOSIS — R1084 Generalized abdominal pain: Secondary | ICD-10-CM | POA: Diagnosis not present

## 2022-08-22 LAB — COMPREHENSIVE METABOLIC PANEL WITH GFR
ALT: 22 U/L (ref 0–44)
AST: 21 U/L (ref 15–41)
Albumin: 3.8 g/dL (ref 3.5–5.0)
Alkaline Phosphatase: 35 U/L — ABNORMAL LOW (ref 38–126)
Anion gap: 11 (ref 5–15)
BUN: 22 mg/dL — ABNORMAL HIGH (ref 6–20)
CO2: 18 mmol/L — ABNORMAL LOW (ref 22–32)
Calcium: 8.7 mg/dL — ABNORMAL LOW (ref 8.9–10.3)
Chloride: 105 mmol/L (ref 98–111)
Creatinine, Ser: 1.02 mg/dL (ref 0.61–1.24)
GFR, Estimated: >60 mL/min
Glucose, Bld: 128 mg/dL — ABNORMAL HIGH (ref 70–99)
Potassium: 3.9 mmol/L (ref 3.5–5.1)
Sodium: 134 mmol/L — ABNORMAL LOW (ref 135–145)
Total Bilirubin: 0.6 mg/dL (ref 0.3–1.2)
Total Protein: 6.7 g/dL (ref 6.5–8.1)

## 2022-08-22 LAB — CBC WITH DIFFERENTIAL/PLATELET
Abs Immature Granulocytes: 0.08 K/uL — ABNORMAL HIGH (ref 0.00–0.07)
Basophils Absolute: 0.1 K/uL (ref 0.0–0.1)
Basophils Relative: 1 %
Eosinophils Absolute: 0.4 K/uL (ref 0.0–0.5)
Eosinophils Relative: 3 %
HCT: 43.8 % (ref 39.0–52.0)
Hemoglobin: 15.2 g/dL (ref 13.0–17.0)
Immature Granulocytes: 1 %
Lymphocytes Relative: 27 %
Lymphs Abs: 2.7 K/uL (ref 0.7–4.0)
MCH: 31.9 pg (ref 26.0–34.0)
MCHC: 34.7 g/dL (ref 30.0–36.0)
MCV: 92 fL (ref 80.0–100.0)
Monocytes Absolute: 0.8 K/uL (ref 0.1–1.0)
Monocytes Relative: 8 %
Neutro Abs: 6.2 K/uL (ref 1.7–7.7)
Neutrophils Relative %: 60 %
Platelets: 254 K/uL (ref 150–400)
RBC: 4.76 MIL/uL (ref 4.22–5.81)
RDW: 13.5 % (ref 11.5–15.5)
WBC: 10.3 K/uL (ref 4.0–10.5)
nRBC: 0 % (ref 0.0–0.2)

## 2022-08-22 LAB — LIPASE, BLOOD: Lipase: 37 U/L (ref 11–51)

## 2022-08-22 LAB — URINALYSIS, ROUTINE W REFLEX MICROSCOPIC
Bilirubin Urine: NEGATIVE
Glucose, UA: NEGATIVE mg/dL
Ketones, ur: NEGATIVE mg/dL
Nitrite: NEGATIVE
Protein, ur: NEGATIVE mg/dL
Specific Gravity, Urine: 1.008 (ref 1.005–1.030)
pH: 5 (ref 5.0–8.0)

## 2022-08-22 MED ORDER — SODIUM CHLORIDE 0.9 % IV BOLUS
1000.0000 mL | Freq: Once | INTRAVENOUS | Status: AC
  Administered 2022-08-22: 1000 mL via INTRAVENOUS

## 2022-08-22 MED ORDER — IOHEXOL 350 MG/ML SOLN
100.0000 mL | Freq: Once | INTRAVENOUS | Status: AC | PRN
  Administered 2022-08-22: 100 mL via INTRAVENOUS

## 2022-08-22 MED ORDER — TRAMADOL HCL 50 MG PO TABS: 50.0000 mg | ORAL_TABLET | Freq: Four times a day (QID) | ORAL | 0 refills | Status: DC | PRN

## 2022-08-22 MED ORDER — KETOROLAC TROMETHAMINE 15 MG/ML IJ SOLN
15.0000 mg | Freq: Once | INTRAMUSCULAR | Status: AC
  Administered 2022-08-22: 15 mg via INTRAVENOUS
  Filled 2022-08-22: qty 1

## 2022-08-22 NOTE — ED Triage Notes (Signed)
EMS states abdominal pain, worse in right groin. History of kidney stones.

## 2022-08-22 NOTE — ED Notes (Signed)
Attempt made to have pt sign the signature pad, pad not working at this time, pt verbalizes understanding of d/c instruc and importance of follow up as well as where his prescriptions were sent

## 2022-08-22 NOTE — Discharge Instructions (Signed)
Please follow-up with Dr. Cherrie Gauze office for further evaluation of your kidney stone on the left side.  Drink lots of fluids throughout the day so that your urine is frequent and dilute.  Please follow-up with Dr. Nino Parsley office for further evaluation of your abdominal aortic aneurysm which is now almost 5 cm big.

## 2022-08-22 NOTE — ED Provider Notes (Signed)
[facility] Provider Note    Event Date/Time   First MD Initiated Contact with Patient 08/22/22 [phone]     (approximate)   History   Chief Complaint: Abdominal Pain   HPI  [name] is a 62 y.o. male with a history of COPD diabetes hypertension AAA who comes ED complaining of left lower abdominal pain radiating to the groin.  Feels like a kidney stone which she has had before.  Also has a history of aortic aneurysm and has not followed up with vascular surgery in 3 or 4 years.  Symptoms have been waxing waning for a week.  Denies chest pain shortness of breath back pain fever chills dizziness or syncope.  No lower extremity weakness.     Physical Exam   Triage Vital Signs: ED Triage Vitals  Enc Vitals Group     BP 08/22/22 0947 (!) 157/94     Pulse Rate 08/22/22 0947 95     Resp 08/22/22 0947 18     Temp 08/22/22 0947 (!) 97.5 F (36.4 C)     Temp Source 08/22/22 0947 Oral     SpO2 08/22/22 0947 97 %     Weight 08/22/22 0945 225 lb 8.5 oz (102.3 kg)     Height 08/22/22 0945 '5\' 9"'$  (1.753 m)     Head Circumference --      Peak Flow --      Pain Score 08/22/22 0945 9     Pain Loc --      Pain Edu? --      Excl. in [place]? --     Most recent vital signs: Vitals:   08/22/22 1100 08/22/22 1231  BP:  133/85  Pulse: 90   Resp: (!) 24   Temp:    SpO2: 93%     General: Awake, no distress.  CV:  Good peripheral perfusion.  Normal distal pulses Resp:  Normal effort.  Abd:  No distention.  Mild tenderness diffusely on the left side, worse in the left lower quadrant.,  Genitalia normal. Other:  No lower extremity edema.  No calf tenderness.   ED Results / Procedures / Treatments   Labs (all labs ordered are listed, but only abnormal results are displayed) Labs Reviewed  COMPREHENSIVE METABOLIC PANEL - Abnormal; Notable for the following components:      Result Value   Sodium 134 (*)    CO2 18 (*)    Glucose, Bld 128 (*)    BUN 22 (*)     Calcium 8.7 (*)    Alkaline Phosphatase 35 (*)    All other components within normal limits  CBC WITH DIFFERENTIAL/PLATELET - Abnormal; Notable for the following components:   Abs Immature Granulocytes 0.08 (*)    All other components within normal limits  URINALYSIS, ROUTINE W REFLEX MICROSCOPIC - Abnormal; Notable for the following components:   Color, Urine YELLOW (*)    APPearance CLEAR (*)    Hgb urine dipstick MODERATE (*)    Leukocytes,Ua TRACE (*)    Bacteria, UA RARE (*)    All other components within normal limits  LIPASE, BLOOD     EKG    RADIOLOGY CT interpreted by me, shows left kidney stone.  No frank hydronephrosis.  No evidence of ruptured aortic aneurysm.  Radiology report reviewed   PROCEDURES:  Procedures   MEDICATIONS ORDERED IN ED: Medications  ketorolac (TORADOL) 15 MG/ML injection 15 mg (15 mg Intravenous Given 08/22/22 1021)  sodium chloride  0.9 % bolus 1,000 mL (0 mLs Intravenous Stopped 08/22/22 1227)  iohexol (OMNIPAQUE) 350 MG/ML injection 100 mL (100 mLs Intravenous Contrast Given 08/22/22 1113)     IMPRESSION / MDM / ASSESSMENT AND PLAN / ED COURSE  I reviewed the triage vital signs and the nursing notes.  DDx: Ureterolithiasis, cystitis, AKI, electrolyte abnormality, viral illness, symptomatic AAA, diverticulitis  Patient's presentation is most consistent with acute presentation with potential threat to life or bodily function.  Patient presents with left lower quadrant abdominal pain, somewhat nonfocal.  With his history, will obtain a CT scan to evaluate for enlarging aortic aneurysm versus hernia diverticulitis or ureterolithiasis.  Will give IV fluids, Toradol for symptom relief.   ----------------------------------------- 12:22 PM on 08/22/2022 ----------------------------------------- Patient feeling much better.  Symptoms resolved.  CT shows AAA is 4.9 cm, no signs of rupture.  There is 11 mm left kidney stone at the UPJ without  frank obstruction.  Urinalysis and creatinine are normal.  Vital signs are normal.  Urology informed and can help coordinate outpatient follow-up.      FINAL CLINICAL IMPRESSION(S) / ED DIAGNOSES   Final diagnoses:  Ureterolithiasis  Infrarenal abdominal aortic aneurysm (AAA) without rupture [facility])     Rx / DC Orders   ED Discharge Orders          Ordered    Ambulatory referral to Vascular Surgery        08/22/22 1219    traMADol (ULTRAM) 50 MG tablet  Every 6 hours PRN        08/22/22 1219             Note:  This document was prepared using Dragon voice recognition software and may include unintentional dictation errors.   Carrie Mew, MD 08/23/22 [phone]

## 2022-09-04 DIAGNOSIS — I10 Essential (primary) hypertension: Secondary | ICD-10-CM | POA: Diagnosis not present

## 2022-09-04 DIAGNOSIS — E1129 Type 2 diabetes mellitus with other diabetic kidney complication: Secondary | ICD-10-CM | POA: Diagnosis not present

## 2022-09-04 DIAGNOSIS — E782 Mixed hyperlipidemia: Secondary | ICD-10-CM | POA: Diagnosis not present

## 2022-09-04 DIAGNOSIS — J449 Chronic obstructive pulmonary disease, unspecified: Secondary | ICD-10-CM | POA: Diagnosis not present

## 2022-09-04 DIAGNOSIS — F17209 Nicotine dependence, unspecified, with unspecified nicotine-induced disorders: Secondary | ICD-10-CM | POA: Diagnosis not present

## 2022-09-04 DIAGNOSIS — R809 Proteinuria, unspecified: Secondary | ICD-10-CM | POA: Diagnosis not present

## 2022-09-06 ENCOUNTER — Other Ambulatory Visit
Admission: RE | Admit: 2022-09-06 | Discharge: 2022-09-06 | Disposition: A | Discharge status: Home / Self Care | Payer: BC Managed Care – PPO | Source: Home / Self Care | Attending: Urology | Admitting: Urology

## 2022-09-06 ENCOUNTER — Ambulatory Visit
Admission: RE | Admit: 2022-09-06 | Discharge: 2022-09-06 | Disposition: A | Discharge status: Home / Self Care | Payer: BC Managed Care – PPO | Source: Ambulatory Visit | Attending: Urology | Admitting: Urology

## 2022-09-06 ENCOUNTER — Ambulatory Visit (INDEPENDENT_AMBULATORY_CARE_PROVIDER_SITE_OTHER): Payer: BC Managed Care – PPO | Admitting: Urology

## 2022-09-06 ENCOUNTER — Telehealth: Payer: Self-pay

## 2022-09-06 ENCOUNTER — Other Ambulatory Visit: Payer: Self-pay | Admitting: Urology

## 2022-09-06 ENCOUNTER — Ambulatory Visit
Admission: RE | Admit: 2022-09-06 | Discharge: 2022-09-06 | Disposition: A | Discharge status: Home / Self Care | Payer: BC Managed Care – PPO | Attending: Urology | Admitting: Urology

## 2022-09-06 ENCOUNTER — Encounter: Payer: Self-pay | Admitting: Urology

## 2022-09-06 ENCOUNTER — Other Ambulatory Visit: Payer: Self-pay | Admitting: *Deleted

## 2022-09-06 VITALS — BP 139/98 | HR 103 | Ht 69.0 in | Wt 231.0 lb

## 2022-09-06 DIAGNOSIS — N200 Calculus of kidney: Secondary | ICD-10-CM | POA: Diagnosis not present

## 2022-09-06 DIAGNOSIS — Z01818 Encounter for other preprocedural examination: Secondary | ICD-10-CM | POA: Insufficient documentation

## 2022-09-06 DIAGNOSIS — N201 Calculus of ureter: Secondary | ICD-10-CM

## 2022-09-06 DIAGNOSIS — N1330 Unspecified hydronephrosis: Secondary | ICD-10-CM | POA: Diagnosis not present

## 2022-09-06 DIAGNOSIS — I878 Other specified disorders of veins: Secondary | ICD-10-CM | POA: Diagnosis not present

## 2022-09-06 LAB — URINALYSIS, COMPLETE (UACMP) WITH MICROSCOPIC
Bacteria, UA: NONE SEEN
Bilirubin Urine: NEGATIVE
Glucose, UA: NEGATIVE mg/dL
Hgb urine dipstick: NEGATIVE
Ketones, ur: NEGATIVE mg/dL
Leukocytes,Ua: NEGATIVE
Nitrite: NEGATIVE
Protein, ur: NEGATIVE mg/dL
RBC / HPF: NONE SEEN RBC/hpf (ref 0–5)
Specific Gravity, Urine: 1.015 (ref 1.005–1.030)
Squamous Epithelial / HPF: NONE SEEN /HPF (ref 0–5)
pH: 6 (ref 5.0–8.0)

## 2022-09-06 MED ORDER — TRAMADOL HCL 50 MG PO TABS: 50.0000 mg | ORAL_TABLET | Freq: Four times a day (QID) | ORAL | 0 refills | Status: DC | PRN

## 2022-09-06 NOTE — Progress Notes (Signed)
   [facility] From  Surgery Date: Date: 09/09/2022  Surgeon: Dr. Hollice Espy, MD  Inpt ( No  )   Outpt (Yes)   Obs ( No  )   Diagnosis: N20.1 Left Ureteral Stone, N20.0 Right Renal Stone   -CPT: [phone]  Surgery: Left Ureteroscopy with Laser Lithotripsy and Stent Placement, Possible Right Ureteroscopy with Laser Lithotripsy and Stent Placement   Stop Anticoagulations: No, HOLD ASA  Cardiac/Medical/Pulmonary Clearance needed: no  *Orders entered into EPIC  Date: 09/06/22   *Case booked in EPIC  Date: 09/06/22  *Notified pt of Surgery: Date: 09/06/22  PRE-OP UA & CX: no  *Placed into Prior Authorization Work Que Date: 09/06/22  Assistant/laser/rep:No

## 2022-09-06 NOTE — H&P (View-Only) (Signed)
Haze Rushing Plume,acting as a scribe for Hollice Espy, MD.,have documented all relevant documentation on the behalf of Hollice Espy, MD,as directed by  Hollice Espy, MD while in the presence of Hollice Espy, MD.  09/06/2022 2:09 PM   Jodi Mourning 14-Jun-1961 [id number]  Referring provider: Sofie Hartigan, [place],  [place] [address]  Chief Complaint  Patient presents with   New Patient (Initial Visit)   Nephrolithiasis    HPI: 62 year-old male who was last seen by me in 2019 with a personal history of kidney stones who returns today to reestablish care after a recent ER visit.   He was seen and evaluated in the emergency room on 08/22/2022 with left lower abdominal pain radiating to his groin. He underwent CT scan that shows an 38m left kidney stone at the UPJ without frank obstruction. His vitals were unremarkable along with his labs. His pain was able to be controlled and he was scheduled for outpatient follow up. He did have a few WBC's and RBC's in his urine for bacteria consistent with a stone episode rather than infection.  He personally underwent a left ureteroscopy laser lithotripsy in 2019. Previous stone composition was 65% calcium oxalate, 20% calcium dihydrate, and 15% calcium phosphate. He had a ureteroscopy in 2016 and shockwave prior to that.   He reported the onset of pain in December, initially believing he would pass the stone as before, but the pain escalated, leading to the ER visit. He denies any fevers, chills, or burning during urination.  Additionally, he is currently taking aspirin for a cardiac concern and have an upcoming appointment with the cardiologist on 09/12/2022.  Results for orders placed or performed during the hospital encounter of 09/06/22  Urinalysis, Complete w Microscopic -  Result Value Ref Range   Color, Urine YELLOW YELLOW   APPearance CLEAR CLEAR   Specific Gravity, Urine 1.015 1.005 - 1.030   pH 6.0 5.0 -  8.0   Glucose, UA NEGATIVE NEGATIVE mg/dL   Hgb urine dipstick NEGATIVE NEGATIVE   Bilirubin Urine NEGATIVE NEGATIVE   Ketones, ur NEGATIVE NEGATIVE mg/dL   Protein, ur NEGATIVE NEGATIVE mg/dL   Nitrite NEGATIVE NEGATIVE   Leukocytes,Ua NEGATIVE NEGATIVE   Squamous Epithelial / HPF NONE SEEN 0 - 5 /HPF   WBC, UA 0-5 0 - 5 WBC/hpf   RBC / HPF NONE SEEN 0 - 5 RBC/hpf   Bacteria, UA NONE SEEN NONE SEEN     PMH: Past Medical History:  Diagnosis Date   AAA (abdominal aortic aneurysm) (HCC)    Acid reflux 12/01/2013   Arthritis    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   [place], [place]   CYSTOSCOPY W/ URETERAL STENT PLACEMENT Left 01/28/2015   Procedure: CYSTOSCOPY WITH RETROGRADE PYELOGRAM/URETERAL STENT PLACEMENT;  Surgeon: TAlexis Frock MD;  Location: [facility] ORS;  Service: Urology;  Laterality: Left;   CYSTOSCOPY/URETEROSCOPY/HOLMIUM LASER/STENT PLACEMENT Left 02/08/2015   Procedure: CYSTOSCOPY/URETEROSCOPY/HOLMIUM LASER/STENT PLACEMENT;  Surgeon: AHollice Espy MD;  Location: [facility] ORS;  Service: Urology;  Laterality: Left;   CYSTOSCOPY/URETEROSCOPY/HOLMIUM LASER/STENT PLACEMENT Left 01/07/2018   Procedure: CYSTOSCOPY/URETEROSCOPY/HOLMIUM LASER/STENT PLACEMENT;  Surgeon: Hollice Espy, MD;  Location: [facility] ORS;  Service: Urology;  Laterality: Left;   FACIAL COSMETIC SURGERY     21 fractures in left cheek and face from baseball   LITHOTRIPSY     VASECTOMY      Home Medications:  Allergies as of 09/06/2022   No Known Allergies      Medication List        Accurate as of September 06, 2022  2:09 PM. If you have any questions, ask your nurse or doctor.          STOP taking  these medications    azithromycin 250 MG tablet Commonly known as: ZITHROMAX Stopped by: Hollice Espy, MD   promethazine-dextromethorphan 6.25-15 MG/5ML syrup Commonly known as: PROMETHAZINE-DM Stopped by: Hollice Espy, MD       TAKE these medications    albuterol 108 (90 Base) MCG/ACT inhaler Commonly known as: VENTOLIN HFA Inhale 2 puffs into the lungs every 6 (six) hours as needed for wheezing or shortness of breath.   aspirin EC 325 MG tablet Take 1 tablet (325 mg total) by mouth daily.   atorvastatin 40 MG tablet Commonly known as: LIPITOR Take 40 mg by mouth daily.   esomeprazole 20 MG capsule Commonly known as: NEXIUM Take 20 mg by mouth every morning.   fenofibrate 145 MG tablet Commonly known as: TRICOR Take 145 mg by mouth every morning.   gabapentin 100 MG capsule Commonly known as: Neurontin Take 1 capsule (100 mg total) by mouth 3 (three) times daily.   Ipratropium-Albuterol 20-100 MCG/ACT Aers respimat Commonly known as: COMBIVENT Inhale 1 puff into the lungs 4 (four) times daily as needed for wheezing.   lisinopril 10 MG tablet Commonly known as: ZESTRIL Take 10 mg by mouth every morning.   metFORMIN 500 MG tablet Commonly known as: GLUCOPHAGE Take 500 mg by mouth 2 (two) times daily with a meal.   Multi-Vitamins Tabs Take 1 tablet by mouth every morning.   mupirocin ointment 2 % Commonly known as: BACTROBAN Apply 1 Application topically 2 (two) times daily.   nicotine 14 mg/24hr patch Commonly known as: NICODERM CQ - dosed in mg/24 hours Place 14 mg onto the skin daily.   nicotine 7 mg/24hr patch Commonly known as: NICODERM CQ - dosed in mg/24 hr Place 7 mg onto the skin daily.   pioglitazone 15 MG tablet Commonly known as: ACTOS Take 15 mg by mouth daily.   sildenafil 20 MG tablet Commonly known as: REVATIO Take 20-60 mg by mouth daily as needed.   traMADol 50 MG tablet Commonly known as: Ultram Take 1 tablet (50 mg  total) by mouth every 6 (six) hours as needed.        Family History: Family History  Problem Relation Age of Onset   Diabetes Mother    Diabetes Maternal Uncle    Cancer Maternal Uncle    Cancer Maternal Uncle    Prostate cancer Neg Hx    Bladder Cancer Neg Hx    Kidney cancer Neg Hx     Social History:  reports that he has been smoking cigarettes. He has a 300.00 pack-year smoking history. He has been exposed to tobacco smoke. He has never used smokeless tobacco. He reports current alcohol use. He reports that he does not use drugs.   Physical Exam: BP (!) 139/98   Pulse (!) 103   Ht 5' 9"$  (1.753 m)   Wt 231 lb (104.8 kg)   BMI 34.11 kg/m  Constitutional:  Alert and oriented, No acute distress. HEENT: [place] AT, moist mucus membranes.  Trachea midline, no masses. Neurologic: Grossly intact, no focal deficits, moving all 4 extremities. Psychiatric: Normal mood and affect.  Pertinent Imaging: CLINICAL DATA:  Left lower quadrant abdominal pain. Abdominal pain, worse and right groin. History of kidney stones.   EXAM: CT ABDOMEN AND PELVIS WITH CONTRAST   TECHNIQUE: Multidetector CT imaging of the abdomen and pelvis was performed using the standard protocol following bolus administration of intravenous contrast.   RADIATION DOSE REDUCTION: This exam was performed according to the departmental dose-optimization program which includes automated exposure control, adjustment of the mA and/or kV according to patient size and/or use of iterative reconstruction technique.   CONTRAST:  1106m OMNIPAQUE IOHEXOL 350 MG/ML SOLN   COMPARISON:  Noncontrast abdominopelvic CT 12/24/2017. Renal ultrasound 02/16/2018.   FINDINGS: Lower chest: Chronic atelectasis or scarring at both lung bases with aortic and coronary artery atherosclerosis. Small hiatal hernia.   Hepatobiliary: Diffuse hepatic steatosis. No focal liver lesion or abnormal enhancement identified. No evidence of  gallstones, gallbladder wall thickening or biliary dilatation.   Pancreas: Unremarkable. No pancreatic ductal dilatation or surrounding inflammatory changes.   Spleen: Normal in size without focal abnormality.   Adrenals/Urinary Tract: Both adrenal glands appear normal. Nonobstructing renal calculi measuring up to 10 mm in the interpolar region of the right kidney. There is chronic left-sided pelvicaliectasis with focal transition at the UPJ, suggesting underlying UPJ stenosis. There is a superimposed calculus at the UPJ, measuring 11 mm on coronal image 60/5, which is likely partially obstructing. There is an additional large calculus dependently in the dilated left renal pelvis, measuring up to 1.7 cm in diameter. There is some wall thickening of the left ureter which is normal in caliber. No significant perinephric soft tissue stranding. Small bladder diverticula are noted. Small renal cysts bilaterally for which no follow-up imaging is recommended.   Stomach/Bowel: No enteric contrast administered. The stomach appears unremarkable for its degree of distension. No evidence of bowel wall thickening, distention or surrounding inflammatory change. Scattered colonic diverticulosis which is greatest in the descending and sigmoid colon.   Vascular/Lymphatic: There are no enlarged abdominal or pelvic lymph nodes. Again demonstrated is an infrarenal abdominal aortic aneurysm which extends approximately 5.5 cm in length. This has enlarged from the previous study, now measuring up to 4.9 cm in greatest transverse dimension (previously 3.8 cm). The aneurysm is eccentric to the left and associated with intramural thrombus. Underlying diffuse aortic and branch vessel atherosclerosis without additional significant aneurysm or signs of large vessel occlusion.   Reproductive: The prostate gland appears unremarkable.   Other: No evidence of abdominal wall mass or hernia. No ascites.    Musculoskeletal: No acute or significant osseous findings. Lower lumbar facet arthropathy.   IMPRESSION: 1. Chronic left-sided pelvicaliectasis with focal transition at the UPJ, suggesting underlying UPJ stenosis. There is a superimposed 11 mm calculus at the UPJ which is likely partially obstructing. There is an additional large calculus dependently in the dilated left renal pelvis. Both of these are visible on the scout image. 2. Nonobstructing right renal calculi. 3. Enlarging infrarenal abdominal aortic aneurysm, now measuring up to 4.9 cm in greatest transverse dimension. Recommend follow-up CT/MR every 6 months and vascular consultation. This recommendation follows ACR consensus guidelines: White Paper of the ACR Incidental Findings Committee II on Vascular Findings. J Am Coll Radiol 2013;CJ:[id number] 4.  Aortic Atherosclerosis (ICD10-I70.0).     Electronically Signed   By:  Richardean Sale M.D.   On: 08/22/2022 11:37  This was personally reviewed and I disagree with the radiologic interpretation. There is evidence of urinary obstruction with a dilated left renal pelvis. He actually has 3 stones, a 11 mm stone at the UPJ causing obstruction as well as a much larger renal pelvic stone on left side measuring 27m as well as a non obstructing stone measuring 14 mm on the right.   Assessment & Plan:   1. Left UPJ stone/ Bilateral nephrolithiasis/ Left hydronephrosis - We discussed various treatment options for urolithiasis including observation with or without medical expulsive therapy, shockwave lithotripsy (SWL), ureteroscopy and laser lithotripsy with stent placement, and percutaneous nephrolithotomy.   We discussed that management is based on stone size, location, density, patient co-morbidities, and patient preference.    Stones <588min size have a >80% spontaneous passage rate. Data surrounding the use of tamsulosin for medical expulsive therapy is controversial, but meta  analyses suggests it is most efficacious for distal stones between 5-1083mn size. Possible side effects include dizziness/lightheadedness, and retrograde ejaculation.   SWL has a lower stone free rate in a single procedure, but also a lower complication rate compared to ureteroscopy and avoids a stent and associated stent related symptoms. Possible complications include renal hematoma, steinstrasse, and need for additional treatment. We discussed the role of his increased skin to stone distance can lead to decreased efficacy with shockwave lithotripsy.   Ureteroscopy with laser lithotripsy and stent placement has a higher stone free rate than SWL in a single procedure, however increased complication rate including possible infection, ureteral injury, bleeding, and stent related morbidity. Common stent related symptoms include dysuria, urgency/frequency, and flank pain.   After an extensive discussion of the risks and benefits of the above treatment options, the patient would like to proceed with a ureteroscopy with laser lithotripsy.   - We will plan to begin with the left side and if things go well we will then consider right sided treatment. He understands that it may be a staged procedure.  - Preoperative UA urine culture today  Return for left (and possibly right) ureteroscopy, laser lithotripsy, and stent placement..  I have reviewed the above documentation for accuracy and completeness, and I agree with the above.   AshHollice EspyD   [facility] [id number]kn[address] RoaduiPea RidgerRising Sun-LebanonC [phone]

## 2022-09-06 NOTE — Progress Notes (Signed)
Haze Rushing Plume,acting as a scribe for Hollice Espy, MD.,have documented all relevant documentation on the behalf of Hollice Espy, MD,as directed by  Hollice Espy, MD while in the presence of Hollice Espy, MD.  09/06/2022 2:09 PM   Jodi Mourning [birth year] [id number]  Referring provider: Sofie Hartigan, [address] [place],  [place] [id number]  Chief Complaint  Patient presents with   New Patient (Initial Visit)   Nephrolithiasis    HPI: 62 year-old male who was last seen by me in 2019 with a personal history of kidney stones who returns today to reestablish care after a recent ER visit.   He was seen and evaluated in the emergency room on 08/22/2022 with left lower abdominal pain radiating to his groin. He underwent CT scan that shows an 97m left kidney stone at the UPJ without frank obstruction. His vitals were unremarkable along with his labs. His pain was able to be controlled and he was scheduled for outpatient follow up. He did have a few WBC's and RBC's in his urine for bacteria consistent with a stone episode rather than infection.  He personally underwent a left ureteroscopy laser lithotripsy in 2019. Previous stone composition was 65% calcium oxalate, 20% calcium dihydrate, and 15% calcium phosphate. He had a ureteroscopy in 2016 and shockwave prior to that.   He reported the onset of pain in December, initially believing he would pass the stone as before, but the pain escalated, leading to the ER visit. He denies any fevers, chills, or burning during urination.  Additionally, he is currently taking aspirin for a cardiac concern and have an upcoming appointment with the cardiologist on 09/12/2022.  Results for orders placed or performed during the hospital encounter of 09/06/22  Urinalysis, Complete w Microscopic -  Result Value Ref Range   Color, Urine YELLOW YELLOW   APPearance CLEAR CLEAR   Specific Gravity, Urine 1.015 1.005 - 1.030   pH 6.0 5.0 -  8.0   Glucose, UA NEGATIVE NEGATIVE mg/dL   Hgb urine dipstick NEGATIVE NEGATIVE   Bilirubin Urine NEGATIVE NEGATIVE   Ketones, ur NEGATIVE NEGATIVE mg/dL   Protein, ur NEGATIVE NEGATIVE mg/dL   Nitrite NEGATIVE NEGATIVE   Leukocytes,Ua NEGATIVE NEGATIVE   Squamous Epithelial / HPF NONE SEEN 0 - 5 /HPF   WBC, UA 0-5 0 - 5 WBC/hpf   RBC / HPF NONE SEEN 0 - 5 RBC/hpf   Bacteria, UA NONE SEEN NONE SEEN     PMH: Past Medical History:  Diagnosis Date   AAA (abdominal aortic aneurysm) (HCC)    Acid reflux 12/01/2013   Arthritis    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   [place], [place]   CYSTOSCOPY W/ URETERAL STENT PLACEMENT Left 01/28/2015   Procedure: CYSTOSCOPY WITH RETROGRADE PYELOGRAM/URETERAL STENT PLACEMENT;  Surgeon: TAlexis Frock MD;  Location: [facility];  Service: Urology;  Laterality: Left;   CYSTOSCOPY/URETEROSCOPY/HOLMIUM LASER/STENT PLACEMENT Left 02/08/2015   Procedure: CYSTOSCOPY/URETEROSCOPY/HOLMIUM LASER/STENT PLACEMENT;  Surgeon: AHollice Espy MD;  Location: [facility];  Service: Urology;  Laterality: Left;   CYSTOSCOPY/URETEROSCOPY/HOLMIUM LASER/STENT PLACEMENT Left 01/07/2018   Procedure: CYSTOSCOPY/URETEROSCOPY/HOLMIUM LASER/STENT PLACEMENT;  Surgeon: Hollice Espy, MD;  Location: [facility];  Service: Urology;  Laterality: Left;   FACIAL COSMETIC SURGERY     21 fractures in left cheek and face from baseball   LITHOTRIPSY     VASECTOMY      Home Medications:  Allergies as of 09/06/2022   No Known Allergies      Medication List        Accurate as of September 06, 2022  2:09 PM. If you have any questions, ask your nurse or doctor.          STOP taking  these medications    azithromycin 250 MG tablet Commonly known as: ZITHROMAX Stopped by: Hollice Espy, MD   promethazine-dextromethorphan 6.25-15 MG/5ML syrup Commonly known as: PROMETHAZINE-DM Stopped by: Hollice Espy, MD       TAKE these medications    albuterol 108 (90 Base) MCG/ACT inhaler Commonly known as: VENTOLIN HFA Inhale 2 puffs into the lungs every 6 (six) hours as needed for wheezing or shortness of breath.   aspirin EC 325 MG tablet Take 1 tablet (325 mg total) by mouth daily.   atorvastatin 40 MG tablet Commonly known as: LIPITOR Take 40 mg by mouth daily.   esomeprazole 20 MG capsule Commonly known as: NEXIUM Take 20 mg by mouth every morning.   fenofibrate 145 MG tablet Commonly known as: TRICOR Take 145 mg by mouth every morning.   gabapentin 100 MG capsule Commonly known as: Neurontin Take 1 capsule (100 mg total) by mouth 3 (three) times daily.   Ipratropium-Albuterol 20-100 MCG/ACT Aers respimat Commonly known as: COMBIVENT Inhale 1 puff into the lungs 4 (four) times daily as needed for wheezing.   lisinopril 10 MG tablet Commonly known as: ZESTRIL Take 10 mg by mouth every morning.   metFORMIN 500 MG tablet Commonly known as: GLUCOPHAGE Take 500 mg by mouth 2 (two) times daily with a meal.   Multi-Vitamins Tabs Take 1 tablet by mouth every morning.   mupirocin ointment 2 % Commonly known as: BACTROBAN Apply 1 Application topically 2 (two) times daily.   nicotine 14 mg/24hr patch Commonly known as: NICODERM CQ - dosed in mg/24 hours Place 14 mg onto the skin daily.   nicotine 7 mg/24hr patch Commonly known as: NICODERM CQ - dosed in mg/24 hr Place 7 mg onto the skin daily.   pioglitazone 15 MG tablet Commonly known as: ACTOS Take 15 mg by mouth daily.   sildenafil 20 MG tablet Commonly known as: REVATIO Take 20-60 mg by mouth daily as needed.   traMADol 50 MG tablet Commonly known as: Ultram Take 1 tablet (50 mg  total) by mouth every 6 (six) hours as needed.        Family History: Family History  Problem Relation Age of Onset   Diabetes Mother    Diabetes Maternal Uncle    Cancer Maternal Uncle    Cancer Maternal Uncle    Prostate cancer Neg Hx    Bladder Cancer Neg Hx    Kidney cancer Neg Hx     Social History:  reports that he has been smoking cigarettes. He has a 300.00 pack-year smoking history. He has been exposed to tobacco smoke. He has never used smokeless tobacco. He reports current alcohol use. He reports that he does not use drugs.   Physical Exam: BP (!) 139/98   Pulse (!) 103   Ht 5' 9"$  (1.753 m)   Wt 231 lb (104.8 kg)   BMI 34.11 kg/m  Constitutional:  Alert and oriented, No acute distress. HEENT: [place] AT, moist mucus membranes.  Trachea midline, no masses. Neurologic: Grossly intact, no focal deficits, moving all 4 extremities. Psychiatric: Normal mood and affect.  Pertinent Imaging: CLINICAL DATA:  Left lower quadrant abdominal pain. Abdominal pain, worse and right groin. History of kidney stones.   EXAM: CT ABDOMEN AND PELVIS WITH CONTRAST   TECHNIQUE: Multidetector CT imaging of the abdomen and pelvis was performed using the standard protocol following bolus administration of intravenous contrast.   RADIATION DOSE REDUCTION: This exam was performed according to the departmental dose-optimization program which includes automated exposure control, adjustment of the mA and/or kV according to patient size and/or use of iterative reconstruction technique.   CONTRAST:  121m OMNIPAQUE IOHEXOL 350 MG/ML SOLN   COMPARISON:  Noncontrast abdominopelvic CT 12/24/2017. Renal ultrasound 02/16/2018.   FINDINGS: Lower chest: Chronic atelectasis or scarring at both lung bases with aortic and coronary artery atherosclerosis. Small hiatal hernia.   Hepatobiliary: Diffuse hepatic steatosis. No focal liver lesion or abnormal enhancement identified. No evidence of  gallstones, gallbladder wall thickening or biliary dilatation.   Pancreas: Unremarkable. No pancreatic ductal dilatation or surrounding inflammatory changes.   Spleen: Normal in size without focal abnormality.   Adrenals/Urinary Tract: Both adrenal glands appear normal. Nonobstructing renal calculi measuring up to 10 mm in the interpolar region of the right kidney. There is chronic left-sided pelvicaliectasis with focal transition at the UPJ, suggesting underlying UPJ stenosis. There is a superimposed calculus at the UPJ, measuring 11 mm on coronal image 60/5, which is likely partially obstructing. There is an additional large calculus dependently in the dilated left renal pelvis, measuring up to 1.7 cm in diameter. There is some wall thickening of the left ureter which is normal in caliber. No significant perinephric soft tissue stranding. Small bladder diverticula are noted. Small renal cysts bilaterally for which no follow-up imaging is recommended.   Stomach/Bowel: No enteric contrast administered. The stomach appears unremarkable for its degree of distension. No evidence of bowel wall thickening, distention or surrounding inflammatory change. Scattered colonic diverticulosis which is greatest in the descending and sigmoid colon.   Vascular/Lymphatic: There are no enlarged abdominal or pelvic lymph nodes. Again demonstrated is an infrarenal abdominal aortic aneurysm which extends approximately 5.5 cm in length. This has enlarged from the previous study, now measuring up to 4.9 cm in greatest transverse dimension (previously 3.8 cm). The aneurysm is eccentric to the left and associated with intramural thrombus. Underlying diffuse aortic and branch vessel atherosclerosis without additional significant aneurysm or signs of large vessel occlusion.   Reproductive: The prostate gland appears unremarkable.   Other: No evidence of abdominal wall mass or hernia. No ascites.    Musculoskeletal: No acute or significant osseous findings. Lower lumbar facet arthropathy.   IMPRESSION: 1. Chronic left-sided pelvicaliectasis with focal transition at the UPJ, suggesting underlying UPJ stenosis. There is a superimposed 11 mm calculus at the UPJ which is likely partially obstructing. There is an additional large calculus dependently in the dilated left renal pelvis. Both of these are visible on the scout image. 2. Nonobstructing right renal calculi. 3. Enlarging infrarenal abdominal aortic aneurysm, now measuring up to 4.9 cm in greatest transverse dimension. Recommend follow-up CT/MR every 6 months and vascular consultation. This recommendation follows ACR consensus guidelines: White Paper of the ACR Incidental Findings Committee II on Vascular Findings. J Am Coll Radiol 2013;[id number] 4.  Aortic Atherosclerosis (ICD10-I70.0).     Electronically Signed   By:  Richardean Sale M.D.   On: 08/22/2022 11:37  This was personally reviewed and I disagree with the radiologic interpretation. There is evidence of urinary obstruction with a dilated left renal pelvis. He actually has 3 stones, a 11 mm stone at the UPJ causing obstruction as well as a much larger renal pelvic stone on left side measuring 63m as well as a non obstructing stone measuring 14 mm on the right.   Assessment & Plan:   1. Left UPJ stone/ Bilateral nephrolithiasis/ Left hydronephrosis - We discussed various treatment options for urolithiasis including observation with or without medical expulsive therapy, shockwave lithotripsy (SWL), ureteroscopy and laser lithotripsy with stent placement, and percutaneous nephrolithotomy.   We discussed that management is based on stone size, location, density, patient co-morbidities, and patient preference.    Stones <561min size have a >80% spontaneous passage rate. Data surrounding the use of tamsulosin for medical expulsive therapy is controversial, but meta  analyses suggests it is most efficacious for distal stones between 5-1026mn size. Possible side effects include dizziness/lightheadedness, and retrograde ejaculation.   SWL has a lower stone free rate in a single procedure, but also a lower complication rate compared to ureteroscopy and avoids a stent and associated stent related symptoms. Possible complications include renal hematoma, steinstrasse, and need for additional treatment. We discussed the role of his increased skin to stone distance can lead to decreased efficacy with shockwave lithotripsy.   Ureteroscopy with laser lithotripsy and stent placement has a higher stone free rate than SWL in a single procedure, however increased complication rate including possible infection, ureteral injury, bleeding, and stent related morbidity. Common stent related symptoms include dysuria, urgency/frequency, and flank pain.   After an extensive discussion of the risks and benefits of the above treatment options, the patient would like to proceed with a ureteroscopy with laser lithotripsy.   - We will plan to begin with the left side and if things go well we will then consider right sided treatment. He understands that it may be a staged procedure.  - Preoperative UA urine culture today  Return for left (and possibly right) ureteroscopy, laser lithotripsy, and stent placement..  I have reviewed the above documentation for accuracy and completeness, and I agree with the above.   AshHollice EspyD   [facility] [id number]uiKarnes CityrOzarkC [id number]

## 2022-09-06 NOTE — Telephone Encounter (Signed)
I spoke with Mr. [name] We have discussed possible surgery dates and Monday February 19th, 2024 was agreed upon by all parties. Patient given information about surgery date, what to expect pre-operatively and post operatively.  We discussed that a Pre-Admission Testing office will be calling to set up the pre-op visit that will take place prior to surgery, and that these appointments are typically done over the phone with a Pre-Admissions RN. Informed patient that our office will communicate any additional care to be provided after surgery. Patients questions or concerns were discussed during our call. Advised to call our office should there be any additional information, questions or concerns that arise. Patient verbalized understanding.

## 2022-09-06 NOTE — Progress Notes (Signed)
Surgical Physician Order Form [facility]  * Scheduling expectation : Next Available  *Length of Case:   *Clearance needed: no  *Anticoagulation Instructions: Hold all anticoagulants  *Aspirin Instructions: Hold Aspirin  *Post-op visit Date/Instructions:   TBD  *Diagnosis: Left Ureteral Stone  *Procedure: left Ureteroscopy w/laser lithotripsy & stent placement [id number]), possible right URS, LL, stent   Additional orders: N/A  -Admit type: OUTpatient  -Anesthesia: General  -VTE Prophylaxis Standing Order SCD's       Other:   -Standing Lab Orders Per Anesthesia    Lab other: None  -Standing Test orders EKG/Chest x-ray per Anesthesia       Test other:   - Medications:  Ancef 2gm IV  -Other orders:  N/A

## 2022-09-08 LAB — URINE CULTURE: Culture: NO GROWTH

## 2022-09-08 MED ORDER — CEFAZOLIN SODIUM-DEXTROSE 2-4 GM/100ML-% IV SOLN
2.0000 g | INTRAVENOUS | Status: AC
  Administered 2022-09-09: 2 g via INTRAVENOUS

## 2022-09-09 ENCOUNTER — Ambulatory Visit: Payer: BC Managed Care – PPO | Admitting: Certified Registered"

## 2022-09-09 ENCOUNTER — Encounter: Payer: Self-pay | Admitting: Urology

## 2022-09-09 ENCOUNTER — Encounter
Admission: RE | Disposition: A | Discharge status: Home / Self Care | Payer: Self-pay | Source: Ambulatory Visit | Attending: Urology

## 2022-09-09 ENCOUNTER — Ambulatory Visit
Admission: RE | Admit: 2022-09-09 | Discharge: 2022-09-09 | Disposition: A | Discharge status: Home / Self Care | Payer: BC Managed Care – PPO | Source: Ambulatory Visit | Attending: Urology | Admitting: Urology

## 2022-09-09 ENCOUNTER — Other Ambulatory Visit: Payer: Self-pay

## 2022-09-09 ENCOUNTER — Ambulatory Visit: Payer: BC Managed Care – PPO

## 2022-09-09 DIAGNOSIS — I714 Abdominal aortic aneurysm, without rupture, unspecified: Secondary | ICD-10-CM | POA: Insufficient documentation

## 2022-09-09 DIAGNOSIS — E1151 Type 2 diabetes mellitus with diabetic peripheral angiopathy without gangrene: Secondary | ICD-10-CM | POA: Diagnosis not present

## 2022-09-09 DIAGNOSIS — Z7982 Long term (current) use of aspirin: Secondary | ICD-10-CM | POA: Insufficient documentation

## 2022-09-09 DIAGNOSIS — Z7984 Long term (current) use of oral hypoglycemic drugs: Secondary | ICD-10-CM | POA: Diagnosis not present

## 2022-09-09 DIAGNOSIS — F17210 Nicotine dependence, cigarettes, uncomplicated: Secondary | ICD-10-CM | POA: Diagnosis not present

## 2022-09-09 DIAGNOSIS — I10 Essential (primary) hypertension: Secondary | ICD-10-CM | POA: Diagnosis not present

## 2022-09-09 DIAGNOSIS — K219 Gastro-esophageal reflux disease without esophagitis: Secondary | ICD-10-CM | POA: Diagnosis not present

## 2022-09-09 DIAGNOSIS — N200 Calculus of kidney: Secondary | ICD-10-CM | POA: Diagnosis not present

## 2022-09-09 DIAGNOSIS — E1169 Type 2 diabetes mellitus with other specified complication: Secondary | ICD-10-CM

## 2022-09-09 DIAGNOSIS — F17219 Nicotine dependence, cigarettes, with unspecified nicotine-induced disorders: Secondary | ICD-10-CM | POA: Diagnosis not present

## 2022-09-09 DIAGNOSIS — E785 Hyperlipidemia, unspecified: Secondary | ICD-10-CM | POA: Diagnosis not present

## 2022-09-09 DIAGNOSIS — J449 Chronic obstructive pulmonary disease, unspecified: Secondary | ICD-10-CM | POA: Diagnosis not present

## 2022-09-09 DIAGNOSIS — N201 Calculus of ureter: Secondary | ICD-10-CM

## 2022-09-09 DIAGNOSIS — N132 Hydronephrosis with renal and ureteral calculous obstruction: Secondary | ICD-10-CM | POA: Diagnosis not present

## 2022-09-09 DIAGNOSIS — N202 Calculus of kidney with calculus of ureter: Secondary | ICD-10-CM | POA: Diagnosis not present

## 2022-09-09 HISTORY — PX: CYSTOSCOPY/URETEROSCOPY/HOLMIUM LASER/STENT PLACEMENT: SHX6546

## 2022-09-09 LAB — GLUCOSE, CAPILLARY: Glucose-Capillary: 159 mg/dL — ABNORMAL HIGH (ref 70–99)

## 2022-09-09 SURGERY — CYSTOSCOPY/URETEROSCOPY/HOLMIUM LASER/STENT PLACEMENT
Anesthesia: General | Laterality: Bilateral

## 2022-09-09 MED ORDER — LIDOCAINE HCL (CARDIAC) PF 100 MG/5ML IV SOSY
PREFILLED_SYRINGE | INTRAVENOUS | Status: DC | PRN
  Administered 2022-09-09: 50 mg via INTRAVENOUS

## 2022-09-09 MED ORDER — FENTANYL CITRATE (PF) 100 MCG/2ML IJ SOLN
25.0000 ug | INTRAMUSCULAR | Status: DC | PRN
  Administered 2022-09-09 (×2): 25 ug via INTRAVENOUS

## 2022-09-09 MED ORDER — MIDAZOLAM HCL 2 MG/2ML IJ SOLN
INTRAMUSCULAR | Status: DC | PRN
  Administered 2022-09-09: 2 mg via INTRAVENOUS

## 2022-09-09 MED ORDER — METOPROLOL TARTRATE 5 MG/5ML IV SOLN
INTRAVENOUS | Status: AC
  Filled 2022-09-09: qty 5

## 2022-09-09 MED ORDER — ONDANSETRON HCL 4 MG/2ML IJ SOLN
INTRAMUSCULAR | Status: AC
  Filled 2022-09-09: qty 2

## 2022-09-09 MED ORDER — MIDAZOLAM HCL 2 MG/2ML IJ SOLN
INTRAMUSCULAR | Status: AC
  Filled 2022-09-09: qty 2

## 2022-09-09 MED ORDER — SUCCINYLCHOLINE CHLORIDE 200 MG/10ML IV SOSY
PREFILLED_SYRINGE | INTRAVENOUS | Status: DC | PRN
  Administered 2022-09-09: 100 mg via INTRAVENOUS

## 2022-09-09 MED ORDER — OXYCODONE HCL 5 MG PO TABS
ORAL_TABLET | ORAL | Status: AC
  Filled 2022-09-09: qty 1

## 2022-09-09 MED ORDER — SUGAMMADEX SODIUM 200 MG/2ML IV SOLN
INTRAVENOUS | Status: DC | PRN
  Administered 2022-09-09: 150 mg via INTRAVENOUS

## 2022-09-09 MED ORDER — PHENYLEPHRINE HCL (PRESSORS) 10 MG/ML IV SOLN
INTRAVENOUS | Status: DC | PRN
  Administered 2022-09-09 (×4): 160 ug via INTRAVENOUS
  Administered 2022-09-09 (×2): 80 ug via INTRAVENOUS

## 2022-09-09 MED ORDER — FENTANYL CITRATE (PF) 100 MCG/2ML IJ SOLN
INTRAMUSCULAR | Status: AC
  Filled 2022-09-09: qty 2

## 2022-09-09 MED ORDER — ROCURONIUM BROMIDE 100 MG/10ML IV SOLN
INTRAVENOUS | Status: DC | PRN
  Administered 2022-09-09 (×2): 20 mg via INTRAVENOUS
  Administered 2022-09-09: 30 mg via INTRAVENOUS

## 2022-09-09 MED ORDER — LIDOCAINE HCL (PF) 2 % IJ SOLN
INTRAMUSCULAR | Status: AC
  Filled 2022-09-09: qty 5

## 2022-09-09 MED ORDER — GLYCOPYRROLATE 0.2 MG/ML IJ SOLN
INTRAMUSCULAR | Status: AC
  Filled 2022-09-09: qty 1

## 2022-09-09 MED ORDER — CHLORHEXIDINE GLUCONATE 0.12 % MT SOLN
OROMUCOSAL | Status: AC
  Administered 2022-09-09: 15 mL via OROMUCOSAL
  Filled 2022-09-09: qty 15

## 2022-09-09 MED ORDER — CEFAZOLIN SODIUM-DEXTROSE 2-4 GM/100ML-% IV SOLN
INTRAVENOUS | Status: AC
  Filled 2022-09-09: qty 100

## 2022-09-09 MED ORDER — SUCCINYLCHOLINE CHLORIDE 200 MG/10ML IV SOSY
PREFILLED_SYRINGE | INTRAVENOUS | Status: AC
  Filled 2022-09-09: qty 10

## 2022-09-09 MED ORDER — OXYBUTYNIN CHLORIDE 5 MG PO TABS: 5.0000 mg | ORAL_TABLET | Freq: Three times a day (TID) | ORAL | 0 refills | Status: DC | PRN

## 2022-09-09 MED ORDER — GLYCOPYRROLATE 0.2 MG/ML IJ SOLN
INTRAMUSCULAR | Status: DC | PRN
  Administered 2022-09-09: .2 mg via INTRAVENOUS

## 2022-09-09 MED ORDER — TAMSULOSIN HCL 0.4 MG PO CAPS: 0.4000 mg | ORAL_CAPSULE | Freq: Every day | ORAL | 0 refills | Status: DC

## 2022-09-09 MED ORDER — SODIUM CHLORIDE 0.9 % IR SOLN
Status: DC | PRN
  Administered 2022-09-09: 3000 mL

## 2022-09-09 MED ORDER — METOPROLOL TARTRATE 5 MG/5ML IV SOLN
INTRAVENOUS | Status: DC | PRN
  Administered 2022-09-09: 2 mg via INTRAVENOUS

## 2022-09-09 MED ORDER — VASOPRESSIN 20 UNIT/ML IV SOLN
INTRAVENOUS | Status: DC | PRN
  Administered 2022-09-09: 2 [IU] via INTRAVENOUS

## 2022-09-09 MED ORDER — PROPOFOL 10 MG/ML IV BOLUS
INTRAVENOUS | Status: AC
  Filled 2022-09-09: qty 20

## 2022-09-09 MED ORDER — PHENYLEPHRINE HCL-NACL 20-0.9 MG/250ML-% IV SOLN
INTRAVENOUS | Status: DC | PRN
  Administered 2022-09-09: 40 ug/min via INTRAVENOUS

## 2022-09-09 MED ORDER — CHLORHEXIDINE GLUCONATE 0.12 % MT SOLN: 15.0000 mL | Freq: Once | OROMUCOSAL | Status: AC

## 2022-09-09 MED ORDER — ONDANSETRON HCL 4 MG/2ML IJ SOLN
INTRAMUSCULAR | Status: DC | PRN
  Administered 2022-09-09: 4 mg via INTRAVENOUS

## 2022-09-09 MED ORDER — HYDROCODONE-ACETAMINOPHEN 5-325 MG PO TABS: 1.0000 | ORAL_TABLET | Freq: Four times a day (QID) | ORAL | 0 refills | Status: DC | PRN

## 2022-09-09 MED ORDER — VASOPRESSIN 20 UNIT/ML IV SOLN
INTRAVENOUS | Status: AC
  Filled 2022-09-09: qty 1

## 2022-09-09 MED ORDER — DEXAMETHASONE SODIUM PHOSPHATE 10 MG/ML IJ SOLN
INTRAMUSCULAR | Status: AC
  Filled 2022-09-09: qty 1

## 2022-09-09 MED ORDER — DEXAMETHASONE SODIUM PHOSPHATE 10 MG/ML IJ SOLN
INTRAMUSCULAR | Status: DC | PRN
  Administered 2022-09-09: 10 mg via INTRAVENOUS

## 2022-09-09 MED ORDER — PROPOFOL 10 MG/ML IV BOLUS
INTRAVENOUS | Status: DC | PRN
  Administered 2022-09-09: 120 mg via INTRAVENOUS

## 2022-09-09 MED ORDER — FENTANYL CITRATE (PF) 100 MCG/2ML IJ SOLN
INTRAMUSCULAR | Status: DC | PRN
  Administered 2022-09-09 (×2): 50 ug via INTRAVENOUS

## 2022-09-09 MED ORDER — OXYCODONE HCL 5 MG PO TABS
5.0000 mg | ORAL_TABLET | Freq: Once | ORAL | Status: AC | PRN
  Administered 2022-09-09: 5 mg via ORAL

## 2022-09-09 MED ORDER — PHENYLEPHRINE HCL-NACL 20-0.9 MG/250ML-% IV SOLN
INTRAVENOUS | Status: AC
  Filled 2022-09-09: qty 250

## 2022-09-09 MED ORDER — LACTATED RINGERS IV SOLN: INTRAVENOUS | Status: DC

## 2022-09-09 MED ORDER — KETOROLAC TROMETHAMINE 30 MG/ML IJ SOLN
INTRAMUSCULAR | Status: AC
  Filled 2022-09-09: qty 1

## 2022-09-09 MED ORDER — IOHEXOL 180 MG/ML  SOLN
INTRAMUSCULAR | Status: DC | PRN
  Administered 2022-09-09: 30 mL

## 2022-09-09 MED ORDER — OXYCODONE HCL 5 MG/5ML PO SOLN: 5.0000 mg | Freq: Once | ORAL | Status: AC | PRN

## 2022-09-09 MED ORDER — ORAL CARE MOUTH RINSE: 15.0000 mL | Freq: Once | OROMUCOSAL | Status: AC

## 2022-09-09 MED ORDER — KETOROLAC TROMETHAMINE 30 MG/ML IJ SOLN
30.0000 mg | Freq: Once | INTRAMUSCULAR | Status: AC
  Administered 2022-09-09: 30 mg via INTRAVENOUS

## 2022-09-09 SURGICAL SUPPLY — 30 items
ADH LQ OCL WTPRF AMP STRL LF (MISCELLANEOUS)
ADHESIVE MASTISOL STRL (MISCELLANEOUS) IMPLANT
BAG DRAIN SIEMENS DORNER NS (MISCELLANEOUS) ×1 IMPLANT
BAG DRN NS LF (MISCELLANEOUS) ×1
BASKET ZERO TIP 1.9FR (BASKET) IMPLANT
BRUSH SCRUB EZ 1% IODOPHOR (MISCELLANEOUS) ×1 IMPLANT
BSKT STON RTRVL ZERO TP 1.9FR (BASKET) ×1
CATH URET FLEX-TIP 2 LUMEN 10F (CATHETERS) IMPLANT
CATH URETL OPEN 5X70 (CATHETERS) ×1 IMPLANT
CNTNR URN SCR LID CUP LEK RST (MISCELLANEOUS) IMPLANT
CONT SPEC 4OZ STRL OR WHT (MISCELLANEOUS)
DRAPE UTILITY 15X26 TOWEL STRL (DRAPES) ×1 IMPLANT
DRSG TEGADERM 2-3/8X2-3/4 SM (GAUZE/BANDAGES/DRESSINGS) IMPLANT
FIBER LASER MOSES 200 DFL (Laser) IMPLANT
GLOVE BIO SURGEON STRL SZ 6.5 (GLOVE) ×1 IMPLANT
GOWN STRL REUS W/ TWL LRG LVL3 (GOWN DISPOSABLE) ×2 IMPLANT
GOWN STRL REUS W/TWL LRG LVL3 (GOWN DISPOSABLE) ×2
GUIDEWIRE GREEN .038 145CM (MISCELLANEOUS) IMPLANT
GUIDEWIRE STR DUAL SENSOR (WIRE) ×1 IMPLANT
IV NS IRRIG 3000ML ARTHROMATIC (IV SOLUTION) ×1 IMPLANT
KIT TURNOVER CYSTO (KITS) ×1 IMPLANT
PACK CYSTO AR (MISCELLANEOUS) ×1 IMPLANT
SET CYSTO W/LG BORE CLAMP LF (SET/KITS/TRAYS/PACK) ×1 IMPLANT
SHEATH NAVIGATOR HD 12/14X36 (SHEATH) IMPLANT
SHEATH NAVIGATOR HD 12/14X46 (SHEATH) IMPLANT
STENT URET 6FRX24 CONTOUR (STENTS) IMPLANT
STENT URET 6FRX26 CONTOUR (STENTS) IMPLANT
SURGILUBE 2OZ TUBE FLIPTOP (MISCELLANEOUS) ×1 IMPLANT
TRAP FLUID SMOKE EVACUATOR (MISCELLANEOUS) ×1 IMPLANT
WATER STERILE IRR 500ML POUR (IV SOLUTION) ×1 IMPLANT

## 2022-09-09 NOTE — Anesthesia Procedure Notes (Signed)
Procedure Name: Intubation Date/Time: 09/09/2022 12:10 PM  Performed by: Rolla Plate, CRNAPre-anesthesia Checklist: Patient identified, Patient being monitored, Timeout performed, Emergency Drugs available and Suction available Patient Re-evaluated:Patient Re-evaluated prior to induction Oxygen Delivery Method: Circle system utilized Preoxygenation: Pre-oxygenation with 100% oxygen Induction Type: IV induction and Rapid sequence Laryngoscope Size: McGraph and 4 Grade View: Grade I Tube type: Oral Tube size: 7.5 mm Number of attempts: 1 Airway Equipment and Method: Stylet and Video-laryngoscopy Placement Confirmation: ETT inserted through vocal cords under direct vision, positive ETCO2 and breath sounds checked- equal and bilateral Secured at: 21 cm Tube secured with: Tape Dental Injury: Teeth and Oropharynx as per pre-operative assessment

## 2022-09-09 NOTE — Op Note (Signed)
Date of procedure: 09/09/22  Preoperative diagnosis:  Left UPJ stone Bilateral nephrolithiasis  Postoperative diagnosis:  Same as above  Procedure: Left and right ureteroscopy Laser lithotripsy Bilateral ureteral stent placement Retrograde pyelogram Interpretation of fluoroscopy less than 30 minutes  Surgeon: Hollice Espy, MD  Anesthesia: General  Complications: None  Intraoperative findings: Large 1.7 cm very hard left renal pelvic stone along with additional nonobstructing stone burden treated.  Copious amount of stone dust and debris.  11 mm UPJ stone was not identified but could represent one of the nonobstructing stones.  Right-sided 10 mm stone in the mid upper pole calyx with infundibular stenosis.  Radiographically stone free at the end of the procedure.  Bilateral stents without tether.  EBL: Minimal  Specimens: Stone fragment  Drains: 6 x 26 French double-J ureteral stent bilaterally  Indication: [name] is a 62 y.o. patient with left flank pain secondary to 11 mm UPJ stone along with multiple bilateral nonobstructing stone burden, left greater than right.  After reviewing the management options for treatment, he/him/his  elected to proceed with the above surgical procedure(s). We have discussed the potential benefits and risks of the procedure, side effects of the proposed treatment, the likelihood of the patient achieving the goals of the procedure, and any potential problems that might occur during the procedure or recuperation. Informed consent has been obtained.  Description of procedure:  The patient was taken to the operating room and general anesthesia was induced.  The patient was placed in the dorsal lithotomy position, prepped and draped in the usual sterile fashion, and preoperative antibiotics were administered. A preoperative time-out was performed.    A 21 French scope was advanced per urethra into the bladder.  Attention was turned to the  left side which was cannulated using a 5 [place] open-ended ureteral catheter.  On scout imaging, there is a stone that he felt was probably the small UPJ stone along with larger 17 mm renal pelvic stone.  On retrograde however, the proximal ureteral stone was not seen.  The renal pelvis was dilated consistent with obstruction.   This was then cannulated using a sensor wire up to the level of the kidney.  A dual-lumen ureteral access sheath was used just within the distal ureter and additional contrast was used to create a retrograde pyelogram on the side.  A Super Stiff wire was then introduced all the way up to the level of the kidney through the second lumen of the catheter.  The sensor wire was snapped in place as a safety wire.  A ureteral access sheath, 10/12 [place] was advanced  just proximal to the ureteral stone.  A dual-lumen digital ureteroscope was then advanced up to the level of the stones which were located in the renal pelvis.  A 242 m laser fiber was then brought in using dusting settings of 0.3 J and 60 Hz and later 80 then 100 Hz, the stone was dusted.  All fragments were removed.  The scope was then advanced into the each of the calyces and additional stone burden was identified and dusted.  At the end of the procedure, no significant residual stone fragment remained greater than the size of the tip of the laser fiber.  A final retrograde pyelogram created roadmap to ensure that each every calyx was visualized and all significant stone burden was addressed.  There was no contrast extravasation.  The scope was then backed down the length of the ureter inspecting the ureteral integrity along  the way.  There was no residual stone burden or significant ureteral injuries appreciated.  Finally, a 6 by 37 French ureteral access sheath was advanced over the safety wire up to the level of the kidney.  Upon wire removal, there was a full coil noted both within the renal pelvis as well as within the  bladder.     Patient was then turned to the right side.  A wire was advanced up to the level of the kidney.  Dual-lumen access sheath was then used to advance a second Super Stiff wire up to the level of the kidney.  The sensor wire was excluded and snapped in place in the Super Stiff was used as a working wire such that the same access sheath could be advanced to the proximal ureter.  I then advanced the scope into the kidney.  I navigated to the 10 mm stone which is located in mid upper pole calyx which had a relatively stenotic infundibulum through which the scope was barely able to pass.  Then fragmented this stone into smaller pieces.  1.9 French tipless nitinol basket was then used to extract all the significant residual pieces of the stone.  Radiographically, the right kidney was stone free.  Contrast was injected through the scope.  1 final retrograde pyelogram.  There was no contrast extravasation and the collecting system was decompressed.  Each every calyx was then directly visualized and no significant residual stone burden was identified.  The scope was then back to length the ureter inspecting along the way.  I backloaded the safety wire over rigid cystoscope and then placed a 6 x 26 French double-J ureteral stent on this side without difficulty such that there was a full coil both within the renal pelvis as well as within the bladder.  The patient was then cleaned and dried, repositioned in supine position, reversed of anesthesia, and taken to the PACU in stable condition.  Plan: Plan for cystoscopy, stent removal next week.   Hollice Espy, M.D.

## 2022-09-09 NOTE — Interval H&P Note (Signed)
History and Physical Interval Note:  09/09/2022 11:27 AM  [name]  has presented today for surgery, with the diagnosis of Left Ureteral Stone, Right Renal Stone.  The various methods of treatment have been discussed with the patient and family. After consideration of risks, benefits and other options for treatment, the patient has consented to  Procedure(s): CYSTOSCOPY/URETEROSCOPY/HOLMIUM LASER/STENT PLACEMENT (Bilateral) as a surgical intervention.  The patient's history has been reviewed, patient examined, no change in status, stable for surgery.  I have reviewed the patient's chart and labs.  Questions were answered to the patient's satisfaction.    RRR CTAB  Hollice Espy

## 2022-09-09 NOTE — Anesthesia Preprocedure Evaluation (Signed)
Anesthesia Evaluation  Patient identified by MRN, date of birth, ID band Patient awake    Reviewed: Allergy & Precautions, H&P , NPO status , Patient's Chart, lab work & pertinent test results  History of Anesthesia Complications Negative for: history of anesthetic complications  Airway Mallampati: III  TM Distance: <3 FB Neck ROM: full    Dental  (+) Chipped, Dental Advidsory Given, Missing   Pulmonary neg shortness of breath, COPD,  COPD inhaler, Current Smoker          Cardiovascular Exercise Tolerance: Good hypertension, (-) angina + Peripheral Vascular Disease  (-) Past MI and (-) DOE      Neuro/Psych negative neurological ROS  negative psych ROS   GI/Hepatic Neg liver ROS,GERD  Medicated and Controlled,,  Endo/Other  diabetes    Renal/GU Renal disease     Musculoskeletal  (+) Arthritis ,    Abdominal   Peds  Hematology negative hematology ROS (+)   Anesthesia Other Findings Past Medical History: No date: AAA (abdominal aortic aneurysm) ([place]) 12/01/2013: Acid reflux No date: Arthritis 12/01/2013: Benign fibroma of prostate 03/14/2015: BP (high blood pressure) 03/14/2015: Calculus of kidney 03/25/2014: COPD, mild ([place]) No date: History of kidney stones 12/01/2013: HLD (hyperlipidemia) No date: Hypertension No date: Renal stones  Past Surgical History: No date: APPENDECTOMY No date: CARDIAC CATHETERIZATION 2004?: CORONARY ANGIOPLASTY     Comment:  [place], [place] 01/28/2015: CYSTOSCOPY W/ URETERAL STENT PLACEMENT; Left     Comment:  Procedure: CYSTOSCOPY WITH RETROGRADE PYELOGRAM/URETERAL              STENT PLACEMENT;  Surgeon: Alexis Frock, MD;  Location:              [facility] ORS;  Service: Urology;  Laterality: Left; 02/08/2015: CYSTOSCOPY/URETEROSCOPY/HOLMIUM LASER/STENT PLACEMENT; Left     Comment:  Procedure: CYSTOSCOPY/URETEROSCOPY/HOLMIUM LASER/STENT               PLACEMENT;  Surgeon: Hollice Espy,  MD;  Location: [facility]               ORS;  Service: Urology;  Laterality: Left; No date: FACIAL COSMETIC SURGERY     Comment:  21 fractures in left cheek and face from baseball No date: LITHOTRIPSY No date: VASECTOMY  BMI    Body Mass Index:  34.42 kg/m      Reproductive/Obstetrics negative OB ROS                             Anesthesia Physical Anesthesia Plan  ASA: III  Anesthesia Plan: General ETT   Post-op Pain Management:    Induction: Intravenous  PONV Risk Score and Plan: 2 and Ondansetron, Dexamethasone and Midazolam  Airway Management Planned: Oral ETT  Additional Equipment:   Intra-op Plan:   Post-operative Plan: Extubation in OR  Informed Consent: I have reviewed the patients History and Physical, chart, labs and discussed the procedure including the risks, benefits and alternatives for the proposed anesthesia with the patient or authorized representative who has indicated his/her understanding and acceptance.     Dental Advisory Given  Plan Discussed with: Anesthesiologist, CRNA and Surgeon  Anesthesia Plan Comments: (Patient consented for risks of anesthesia including but not limited to:  - adverse reactions to medications - damage to teeth, lips or other oral mucosa - sore throat or hoarseness - Damage to heart, brain, lungs or loss of life  Patient voiced understanding.)        Anesthesia  Quick Evaluation

## 2022-09-09 NOTE — Anesthesia Postprocedure Evaluation (Signed)
Anesthesia Post Note  Patient: [name]  Procedure(s) Performed: CYSTOSCOPY/URETEROSCOPY/HOLMIUM LASER/STENT PLACEMENT/RETROGRADE (Bilateral)  Patient location during evaluation: PACU Anesthesia Type: General Level of consciousness: awake and alert Pain management: pain level controlled Vital Signs Assessment: post-procedure vital signs reviewed and stable Respiratory status: spontaneous breathing, nonlabored ventilation, respiratory function stable and patient connected to nasal cannula oxygen Cardiovascular status: blood pressure returned to baseline and stable Postop Assessment: no apparent nausea or vomiting Anesthetic complications: no  No notable events documented.   Last Vitals:  Vitals:   09/09/22 1443 09/09/22 1445  BP: 90/65 94/63  Pulse: 96 87  Resp: 17 20  Temp:  (!) 36.3 C  SpO2: 93% 92%    Last Pain:  Vitals:   09/09/22 1445  TempSrc:   PainSc: [place]

## 2022-09-09 NOTE — Discharge Instructions (Addendum)
You have a ureteral stent in place.  This is a tube that extends from your kidney to your bladder.  This may cause urinary bleeding, burning with urination, and urinary frequency.  Please call our office or present to the ED if you develop fevers >101 or pain which is not able to be controlled with oral pain medications.  You may be given either Flomax and/ or ditropan to help with bladder spasms and stent pain in addition to pain medications.    [facility] [address], [place], [place] [address] [phone]   AMBULATORY SURGERY  DISCHARGE INSTRUCTIONS   The drugs that you were given will stay in your system until tomorrow so for the next 24 hours you should not:  Drive an automobile Make any legal decisions Drink any alcoholic beverage   You may resume regular meals tomorrow.  Today it is better to start with liquids and gradually work up to solid foods.  You may eat anything you prefer, but it is better to start with liquids, then soup and crackers, and gradually work up to solid foods.   Please notify your doctor immediately if you have any unusual bleeding, trouble breathing, redness and pain at the surgery site, drainage, fever, or pain not relieved by medication.    Additional Instructions:  Make any legal decisions Drink any alcoholic beverage   You may resume regular meals tomorrow.  Today it is better to start with liquids and gradually work up to solid foods.  You may eat anything you prefer, but it is better to start with liquids, then soup and crackers, and gradually work up to solid foods.    Please contact your physician with any problems or Same Day Surgery at [phone], Monday through Friday 6 am to 4 pm, or [place] at [facility] number at [phone]. Please contact your physician with any problems or Same Day Surgery at [phone], Monday through Friday 6 am to 4 pm, or [place] at [facility] number  at [phone].

## 2022-09-09 NOTE — Transfer of Care (Signed)
Immediate Anesthesia Transfer of Care Note  Patient: [name]  Procedure(s) Performed: CYSTOSCOPY/URETEROSCOPY/HOLMIUM LASER/STENT PLACEMENT (Bilateral)  Patient Location: PACU  Anesthesia Type:General  Level of Consciousness: awake, alert , and oriented  Airway & Oxygen Therapy: Patient Spontanous Breathing and Patient connected to face mask oxygen  Post-op Assessment: Report given to RN and Post -op Vital signs reviewed and stable  Post vital signs: Reviewed  Last Vitals:  Vitals Value Taken Time  BP 143/91   Temp    Pulse 89 09/09/22 1341  Resp 28 09/09/22 1341  SpO2 95 % 09/09/22 1341  Vitals shown include unvalidated device data.  Last Pain:         Complications: No notable events documented.

## 2022-09-09 NOTE — Progress Notes (Signed)
Patient blood pressure has been a little lower in PACU. Notified Dr. Barbra Sarks. No new orders given and says he is okay with it. Patient is asymptomatic. BP 94/63 (73). Prior 88/64 (73).

## 2022-09-10 ENCOUNTER — Encounter: Payer: Self-pay | Admitting: Urology

## 2022-09-10 LAB — GLUCOSE, CAPILLARY: Glucose-Capillary: 123 mg/dL — ABNORMAL HIGH (ref 70–99)

## 2022-09-12 ENCOUNTER — Ambulatory Visit (INDEPENDENT_AMBULATORY_CARE_PROVIDER_SITE_OTHER): Payer: BC Managed Care – PPO | Admitting: Nurse Practitioner

## 2022-09-12 ENCOUNTER — Encounter (INDEPENDENT_AMBULATORY_CARE_PROVIDER_SITE_OTHER): Payer: Self-pay | Admitting: Nurse Practitioner

## 2022-09-12 VITALS — BP 101/70 | HR 133 | Resp 20 | Ht 69.0 in | Wt 229.0 lb

## 2022-09-12 DIAGNOSIS — E1169 Type 2 diabetes mellitus with other specified complication: Secondary | ICD-10-CM

## 2022-09-12 DIAGNOSIS — E1159 Type 2 diabetes mellitus with other circulatory complications: Secondary | ICD-10-CM

## 2022-09-12 DIAGNOSIS — E785 Hyperlipidemia, unspecified: Secondary | ICD-10-CM

## 2022-09-12 DIAGNOSIS — I739 Peripheral vascular disease, unspecified: Secondary | ICD-10-CM | POA: Diagnosis not present

## 2022-09-12 DIAGNOSIS — I7143 Infrarenal abdominal aortic aneurysm, without rupture: Secondary | ICD-10-CM

## 2022-09-12 DIAGNOSIS — I152 Hypertension secondary to endocrine disorders: Secondary | ICD-10-CM

## 2022-09-12 NOTE — Progress Notes (Signed)
Subjective:    Patient ID: [name], male    DOB: June 05, 1961, 61 y.o.   MRN: [id number] Chief Complaint  Patient presents with   New Patient (Initial Visit)    np. consult. Enlarging infrarenal abdominal aortic aneurysm, now measuring up to 4.9 cm in greatest transverse dimension. referred by [facility]. PCP -feldpausch, dale.      [name] is a 62 year old male that returns to the office for surveillance of a known abdominal aortic aneurysm. Patient denies abdominal pain or back pain, no other abdominal complaints. No changes suggesting embolic episodes.   The patient's abdominal aortic aneurysm was noted following workup for kidney stones.  The patient was previously followed by our office.  He was last seen in 2019 with a 3.5 cm aneurysm.  Patient denies amaurosis fugax or TIA symptoms.  He does endorsed having claudication-like symptoms in his bilateral hips.  The patient denies angina or shortness of breath.   Recent CT scan shows a 4.91 abdominal aortic aneurysm that is saccular in nature    Review of Systems  Cardiovascular:        Claudication  All other systems reviewed and are negative.      Objective:   Physical Exam Vitals reviewed.  HENT:     Head: Normocephalic.  Cardiovascular:     Rate and Rhythm: Normal rate.  Pulmonary:     Effort: Pulmonary effort is normal.  Skin:    General: Skin is warm and dry.  Neurological:     Mental Status: He is alert and oriented to person, place, and time.  Psychiatric:        Mood and Affect: Mood normal.        Behavior: Behavior normal.        Thought Content: Thought content normal.        Judgment: Judgment normal.     BP 101/70 (BP Location: Left Arm)   Pulse (!) 133   Resp 20   Ht 5' 9"$  (1.753 m)   Wt 229 lb (103.9 kg)   BMI 33.82 kg/m   Past Medical History:  Diagnosis Date   AAA (abdominal aortic aneurysm) (HCC)    Acid reflux 12/01/2013   Arthritis    Benign fibroma of prostate  12/01/2013   Calculus of kidney 03/14/2015   COPD, mild ([place]) 03/25/2014   History of kidney stones    HLD (hyperlipidemia) 12/01/2013   Hypertension    Renal stones     Social History   Socioeconomic History   Marital status: Widowed    Spouse name: Not on file   Number of children: Not on file   Years of education: Not on file   Highest education level: Not on file  Occupational History   Not on file  Tobacco Use   Smoking status: Every Day    Packs/day: 10.00    Years: 30.00    Total pack years: 300.00    Types: Cigarettes    Passive exposure: Current   Smokeless tobacco: Never  Vaping Use   Vaping Use: Never used  Substance and Sexual Activity   Alcohol use: Yes    Comment: occasionally   Drug use: No   Sexual activity: Not on file  Other Topics Concern   Not on file  Social History Narrative   Not on file   Social Determinants of Health   Financial Resource Strain: Not on file  Food Insecurity: Not on file  Transportation Needs: Not on file  Physical Activity: Not on file  Stress: Not on file  Social Connections: Not on file  Intimate Partner Violence: Not on file    Past Surgical History:  Procedure Laterality Date   APPENDECTOMY     CARDIAC CATHETERIZATION     CORONARY ANGIOPLASTY  2004?   [place], [place]   CYSTOSCOPY W/ URETERAL STENT PLACEMENT Left 01/28/2015   Procedure: CYSTOSCOPY WITH RETROGRADE PYELOGRAM/URETERAL STENT PLACEMENT;  Surgeon: Alexis Frock, MD;  Location: [facility] ORS;  Service: Urology;  Laterality: Left;   CYSTOSCOPY/URETEROSCOPY/HOLMIUM LASER/STENT PLACEMENT Left 02/08/2015   Procedure: CYSTOSCOPY/URETEROSCOPY/HOLMIUM LASER/STENT PLACEMENT;  Surgeon: Hollice Espy, MD;  Location: [facility] ORS;  Service: Urology;  Laterality: Left;   CYSTOSCOPY/URETEROSCOPY/HOLMIUM LASER/STENT PLACEMENT Left 01/07/2018   Procedure: CYSTOSCOPY/URETEROSCOPY/HOLMIUM LASER/STENT PLACEMENT;  Surgeon: Hollice Espy, MD;  Location: [facility];  Service: Urology;   Laterality: Left;   CYSTOSCOPY/URETEROSCOPY/HOLMIUM LASER/STENT PLACEMENT Bilateral 09/09/2022   Procedure: CYSTOSCOPY/URETEROSCOPY/HOLMIUM LASER/STENT PLACEMENT/RETROGRADE;  Surgeon: Hollice Espy, MD;  Location: [facility] ORS;  Service: Urology;  Laterality: Bilateral;   FACIAL COSMETIC SURGERY     21 fractures in left cheek and face from baseball   LITHOTRIPSY     VASECTOMY      Family History  Problem Relation Age of Onset   Diabetes Mother    Diabetes Maternal Uncle    Cancer Maternal Uncle    Cancer Maternal Uncle    Prostate cancer Neg Hx    Bladder Cancer Neg Hx    Kidney cancer Neg Hx     No Known Allergies     Latest Ref Rng & Units 08/22/2022    9:48 AM 07/23/2022    6:21 PM 04/24/2022    9:58 PM  CBC  WBC 4.0 - 10.5 K/uL 10.3  9.1  10.5   Hemoglobin 13.0 - 17.0 g/dL 15.2  16.9  15.5   Hematocrit 39.0 - 52.0 % 43.8  50.7  47.1   Platelets 150 - 400 K/uL 254  294  270       CMP     Component Value Date/Time   NA 134 (L) 08/22/2022 0948   NA 135 (L) 09/25/2012 1155   K 3.9 08/22/2022 0948   K 3.7 09/25/2012 1155   CL 105 08/22/2022 0948   CL 102 09/25/2012 1155   CO2 18 (L) 08/22/2022 0948   CO2 26 09/25/2012 1155   GLUCOSE 128 (H) 08/22/2022 0948   GLUCOSE 95 09/25/2012 1155   BUN 22 (H) 08/22/2022 0948   BUN 16 09/25/2012 1155   CREATININE 1.02 08/22/2022 0948   CREATININE 0.90 09/25/2012 1155   CALCIUM 8.7 (L) 08/22/2022 0948   CALCIUM 8.8 09/25/2012 1155   PROT 6.7 08/22/2022 0948   PROT 8.2 09/25/2012 1155   ALBUMIN 3.8 08/22/2022 0948   ALBUMIN 3.9 09/25/2012 1155   AST 21 08/22/2022 0948   AST 24 09/25/2012 1155   ALT 22 08/22/2022 0948   ALT 47 09/25/2012 1155   ALKPHOS 35 (L) 08/22/2022 0948   ALKPHOS 89 09/25/2012 1155   BILITOT 0.6 08/22/2022 0948   BILITOT 0.6 09/25/2012 1155   GFRNONAA >60 08/22/2022 0948   GFRNONAA >60 09/25/2012 1155   GFRAA >60 05/04/2019 2004   GFRAA >60 09/25/2012 1155     No results found.     Assessment  & Plan:   1. Infrarenal abdominal aortic aneurysm (AAA) without rupture (HCC) Recommend:  The patient has an abdominal aortic aneurysm that is nearly 5.0 cm or greater by duplex scan and based on this study  it appears that it is suitable for endovascular treatment.    The patient is otherwise in reasonable health.   Therefore, the patient should undergo endovascular repair of the AAA to prevent future leathal rupture.   Patient will require CT angiography of the abdomen and pelvis in order to appropriately plan repair of the AAA.  The risks and benefits as well as the alternative therapies was discussed in detail with the patient. All questions were answered. The patient agrees to move forward the AAA repair.  Therefore a CT angiogram with be scheduled as an outpatient.  The patient will follow up with me in the office after the CT scan to review the study and finalize plan for repair. - CT Angio Abd/Pel w/ and/or w/o; Future  2. Hypertension associated with diabetes ([place]) Continue antihypertensive medications as already ordered, these medications have been reviewed and there are no changes at this time.  3. Hyperlipidemia associated with type 2 diabetes mellitus ([place]) Continue statin as ordered and reviewed, no changes at this time  4. PVD (peripheral vascular disease) ([place]) The patient has symptoms consistent with claudication that would likely be iliac level.  The patient seemed to have what appears to be evidence of atherosclerosis in the lateral common iliac arteries on CT scan.   Given that we are planning on doing a possible treatment of his abdominal aortic aneurysm, we will also be able to treat this as well.  We will evaluate for possibility of focal stenosis on his upcoming CT scan.   Current Outpatient Medications on File Prior to Visit  Medication Sig Dispense Refill   albuterol (VENTOLIN HFA) 108 (90 Base) MCG/ACT inhaler Inhale 2 puffs into the lungs every 6 (six) hours as  needed for wheezing or shortness of breath. 16 g 2   aspirin EC 325 MG tablet Take 1 tablet (325 mg total) by mouth daily. 30 tablet 0   atorvastatin (LIPITOR) 40 MG tablet Take 40 mg by mouth daily.     esomeprazole (NEXIUM) 20 MG capsule Take 20 mg by mouth every morning.     fenofibrate (TRICOR) 145 MG tablet Take 145 mg by mouth every morning.   3   HYDROcodone-acetaminophen (NORCO/VICODIN) 5-325 MG tablet Take 1-2 tablets by mouth every 6 (six) hours as needed for moderate pain. 10 tablet 0   Ipratropium-Albuterol (COMBIVENT) 20-100 MCG/ACT AERS respimat Inhale 1 puff into the lungs 4 (four) times daily as needed for wheezing.      lisinopril (PRINIVIL,ZESTRIL) 10 MG tablet Take 10 mg by mouth every morning.      metFORMIN (GLUCOPHAGE) 500 MG tablet Take 500 mg by mouth 2 (two) times daily with a meal.  0   Multiple Vitamin (MULTI-VITAMINS) TABS Take 1 tablet by mouth every morning.      nicotine (NICODERM CQ - DOSED IN MG/24 HOURS) 14 mg/24hr patch Place 14 mg onto the skin daily.     oxybutynin (DITROPAN) 5 MG tablet Take 1 tablet (5 mg total) by mouth every 8 (eight) hours as needed for bladder spasms. 30 tablet 0   pioglitazone (ACTOS) 15 MG tablet Take 15 mg by mouth daily.     sildenafil (REVATIO) 20 MG tablet Take 20-60 mg by mouth daily as needed.     tamsulosin (FLOMAX) 0.4 MG CAPS capsule Take 1 capsule (0.4 mg total) by mouth daily. 30 capsule 0   traMADol (ULTRAM) 50 MG tablet Take 1 tablet (50 mg total) by mouth every 6 (six) hours as needed.  10 tablet 0   gabapentin (NEURONTIN) 100 MG capsule Take 1 capsule (100 mg total) by mouth 3 (three) times daily. (Patient not taking: Reported on 09/12/2022) 90 capsule 2   mupirocin ointment (BACTROBAN) 2 % Apply 1 Application topically 2 (two) times daily. (Patient not taking: Reported on 09/12/2022) 22 g 0   nicotine (NICODERM CQ - DOSED IN MG/24 HR) 7 mg/24hr patch Place 7 mg onto the skin daily. (Patient not taking: Reported on 09/12/2022)      Current Facility-Administered Medications on File Prior to Visit  Medication Dose Route Frequency Provider Last Rate Last Admin   dexamethasone (DECADRON) injection    Anesthesia Intra-op Silvana Newness, CRNA   5 mg at 02/08/15 1339   fentaNYL (SUBLIMAZE) injection    Anesthesia Intra-op Silvana Newness, CRNA   50 mcg at 02/08/15 1416   glycopyrrolate (ROBINUL) injection    Anesthesia Intra-op Silvana Newness, CRNA   0.6 mg at 02/08/15 1419   lidocaine (cardiac) 100 mg/39m (XYLOCAINE) 20 MG/ML injection 2%   Intravenous Anesthesia Intra-op SSilvana Newness CRNA   100 mg at 02/08/15 1317   midazolam (VERSED) injection    Anesthesia Intra-op SSilvana Newness CRNA   2 mg at 01/07/18 1305   neostigmine (BLOXIVERZ) injection   Intravenous Anesthesia Intra-op SSilvana Newness CRNA   4 mg at 02/08/15 1419   ondansetron (ZOFRAN) injection   Intravenous Anesthesia Intra-op SSilvana Newness CRNA   4 mg at 02/08/15 1421   propofol (DIPRIVAN) 10 mg/mL bolus/IV push    Anesthesia Intra-op SSilvana Newness CRNA   180 mg at 02/08/15 1318   rocuronium ([facility] injection    Anesthesia Intra-op SSilvana Newness CRNA   10 mg at 02/08/15 1349    There are no Patient Instructions on file for this visit. No follow-ups on file.   FKris Hartmann NP

## 2022-09-16 ENCOUNTER — Other Ambulatory Visit: Payer: Self-pay | Admitting: Urology

## 2022-09-16 LAB — CALCULI, WITH PHOTOGRAPH (CLINICAL LAB)
Calcium Oxalate Monohydrate: 100 %
Weight Calculi: 16 mg

## 2022-09-19 ENCOUNTER — Ambulatory Visit (INDEPENDENT_AMBULATORY_CARE_PROVIDER_SITE_OTHER): Payer: BC Managed Care – PPO | Admitting: Urology

## 2022-09-19 ENCOUNTER — Encounter: Payer: Self-pay | Admitting: Urology

## 2022-09-19 VITALS — BP 123/77 | HR 106 | Ht 69.0 in | Wt 233.0 lb

## 2022-09-19 DIAGNOSIS — R8290 Unspecified abnormal findings in urine: Secondary | ICD-10-CM | POA: Diagnosis not present

## 2022-09-19 DIAGNOSIS — N200 Calculus of kidney: Secondary | ICD-10-CM

## 2022-09-19 DIAGNOSIS — Z87442 Personal history of urinary calculi: Secondary | ICD-10-CM

## 2022-09-19 LAB — URINALYSIS, COMPLETE
Bilirubin, UA: NEGATIVE
Glucose, UA: NEGATIVE
Nitrite, UA: POSITIVE — AB
Specific Gravity, UA: 1.015 (ref 1.005–1.030)
Urobilinogen, Ur: 1 mg/dL (ref 0.2–1.0)
pH, UA: 5.5 (ref 5.0–7.5)

## 2022-09-19 LAB — MICROSCOPIC EXAMINATION: RBC, Urine: >30 /hpf — AB (ref 0–2)

## 2022-09-19 MED ORDER — SULFAMETHOXAZOLE-TRIMETHOPRIM 800-160 MG PO TABS: 1.0000 | ORAL_TABLET | Freq: Two times a day (BID) | ORAL | Status: DC

## 2022-09-19 MED ORDER — HYDROCODONE-ACETAMINOPHEN 5-325 MG PO TABS: 1.0000 | ORAL_TABLET | Freq: Four times a day (QID) | ORAL | 0 refills | Status: DC | PRN

## 2022-09-19 MED ORDER — SULFAMETHOXAZOLE-TRIMETHOPRIM 800-160 MG PO TABS: 1.0000 | ORAL_TABLET | Freq: Two times a day (BID) | ORAL | 0 refills | Status: DC

## 2022-09-19 NOTE — Progress Notes (Signed)
09/19/2022 12:00 PM   [name] 04-21-1961 [id number]  Referring provider: Sofie Hartigan, [place],  [place] [id number]  Chief Complaint  Patient presents with   Cysto Stent Removal    HPI: 62 year old male who presents today for cystoscopy, stent removal.  Please see previous notes for details.  He reports that he is been having some tenderness in his kidney as well as ongoing bleeding.  He has not had any fevers or chills.  Urinalysis today is highly suspicious including positive for nitrates, WBCs and RBCs along with bacteria.   PMH: Past Medical History:  Diagnosis Date   AAA (abdominal aortic aneurysm) ([place])    Acid reflux 12/01/2013   Arthritis    Benign fibroma of prostate 12/01/2013   Calculus of kidney 03/14/2015   COPD, mild ([place]) 03/25/2014   History of kidney stones    HLD (hyperlipidemia) 12/01/2013   Hypertension    Renal stones     Surgical History: Past Surgical History:  Procedure Laterality Date   APPENDECTOMY     CARDIAC CATHETERIZATION     CORONARY ANGIOPLASTY  2004?   [place], [place]   CYSTOSCOPY W/ URETERAL STENT PLACEMENT Left 01/28/2015   Procedure: CYSTOSCOPY WITH RETROGRADE PYELOGRAM/URETERAL STENT PLACEMENT;  Surgeon: Alexis Frock, MD;  Location: [facility];  Service: Urology;  Laterality: Left;   CYSTOSCOPY/URETEROSCOPY/HOLMIUM LASER/STENT PLACEMENT Left 02/08/2015   Procedure: CYSTOSCOPY/URETEROSCOPY/HOLMIUM LASER/STENT PLACEMENT;  Surgeon: Hollice Espy, MD;  Location: [facility];  Service: Urology;  Laterality: Left;   CYSTOSCOPY/URETEROSCOPY/HOLMIUM LASER/STENT PLACEMENT Left 01/07/2018   Procedure: CYSTOSCOPY/URETEROSCOPY/HOLMIUM LASER/STENT PLACEMENT;  Surgeon: Hollice Espy, MD;  Location: [facility];  Service: Urology;  Laterality: Left;   CYSTOSCOPY/URETEROSCOPY/HOLMIUM LASER/STENT PLACEMENT Bilateral 09/09/2022   Procedure: CYSTOSCOPY/URETEROSCOPY/HOLMIUM LASER/STENT PLACEMENT/RETROGRADE;  Surgeon: Hollice Espy, MD;  Location: [facility];  Service: Urology;  Laterality: Bilateral;   FACIAL COSMETIC SURGERY     21 fractures in left cheek and face from baseball   LITHOTRIPSY     VASECTOMY      Home Medications:  Allergies as of 09/19/2022   No Known Allergies      Medication List        Accurate as of September 19, 2022 12:00 PM. If you have any questions, ask your nurse or doctor.          albuterol 108 (90 Base) MCG/ACT inhaler Commonly known as: VENTOLIN HFA Inhale 2 puffs into the lungs every 6 (six) hours as needed for wheezing or shortness of breath.   aspirin EC 325 MG tablet Take 1 tablet (325 mg total) by mouth daily.   atorvastatin 40 MG tablet Commonly known as: LIPITOR Take 40 mg by mouth daily.   esomeprazole 20 MG capsule Commonly known as: NEXIUM Take 20 mg by mouth every morning.   fenofibrate 145 MG tablet Commonly known as: TRICOR Take 145 mg by mouth every morning.   gabapentin 100 MG capsule Commonly known as: Neurontin Take 1 capsule (100 mg total) by mouth 3 (three) times daily.   HYDROcodone-acetaminophen 5-325 MG tablet Commonly known as: NORCO/VICODIN Take 1-2 tablets by mouth every 6 (six) hours as needed for moderate pain.   Ipratropium-Albuterol 20-100 MCG/ACT Aers respimat Commonly known as: COMBIVENT Inhale 1 puff into the lungs 4 (four) times daily as needed for wheezing.   lisinopril 10 MG tablet Commonly known as: ZESTRIL Take 10 mg by mouth every morning.   meloxicam 15 MG tablet Commonly known as: MOBIC Take 15 mg by mouth  daily.   metFORMIN 500 MG tablet Commonly known as: GLUCOPHAGE Take 500 mg by mouth 2 (two) times daily with a meal.   Multi-Vitamins Tabs Take 1 tablet by mouth every morning.   mupirocin ointment 2 % Commonly known as: BACTROBAN Apply 1 Application topically 2 (two) times daily.   nicotine 7 mg/24hr patch Commonly known as: NICODERM CQ - dosed in mg/24 hr Place 7 mg onto the skin daily.    oxybutynin 5 MG tablet Commonly known as: DITROPAN Take 1 tablet (5 mg total) by mouth every 8 (eight) hours as needed for bladder spasms.   pioglitazone 15 MG tablet Commonly known as: ACTOS Take 15 mg by mouth daily.   sildenafil 20 MG tablet Commonly known as: REVATIO Take 20-60 mg by mouth daily as needed.   sulfamethoxazole-trimethoprim 800-160 MG tablet Commonly known as: BACTRIM DS Take 1 tablet by mouth 2 (two) times daily. Started by: Hollice Espy, MD   tamsulosin 0.4 MG Caps capsule Commonly known as: Flomax Take 1 capsule (0.4 mg total) by mouth daily.   traMADol 50 MG tablet Commonly known as: Ultram Take 1 tablet (50 mg total) by mouth every 6 (six) hours as needed.        Allergies: No Known Allergies  Family History: Family History  Problem Relation Age of Onset   Diabetes Mother    Diabetes Maternal Uncle    Cancer Maternal Uncle    Cancer Maternal Uncle    Prostate cancer Neg Hx    Bladder Cancer Neg Hx    Kidney cancer Neg Hx     Social History:  reports that he has been smoking cigarettes. He has a 300.00 pack-year smoking history. He has been exposed to tobacco smoke. He has never used smokeless tobacco. He reports current alcohol use. He reports that he does not use drugs.   Physical Exam: BP 123/77   Pulse (!) 106   Ht '5\' 9"'$  (1.753 m)   Wt 233 lb (105.7 kg)   BMI 34.41 kg/m   Constitutional:  Alert and oriented, No acute distress. HEENT: [place] AT, moist mucus membranes.  Trachea midline, no masses. Cardiovascular: No clubbing, cyanosis, or edema. Neurologic: Grossly intact, no focal deficits, moving all 4 extremities. Psychiatric: Normal mood and affect.  Laboratory Data: Lab Results  Component Value Date   WBC 10.3 08/22/2022   HGB 15.2 08/22/2022   HCT 43.8 08/22/2022   MCV 92.0 08/22/2022   PLT 254 08/22/2022    Lab Results  Component Value Date   CREATININE 1.02 08/22/2022    No results found for: "PSA"  No results  found for: "TESTOSTERONE"  No results found for: "HGBA1C"  Urinalysis    Component Value Date/Time   COLORURINE YELLOW 09/06/2022 1339   APPEARANCEUR CLEAR 09/06/2022 1339   APPEARANCEUR Clear 12/25/2017 1327   LABSPEC 1.015 09/06/2022 1339   LABSPEC 1.020 09/25/2012 1155   PHURINE 6.0 09/06/2022 1339   GLUCOSEU NEGATIVE 09/06/2022 1339   GLUCOSEU Negative 09/25/2012 1155   HGBUR NEGATIVE 09/06/2022 1339   BILIRUBINUR NEGATIVE 09/06/2022 1339   BILIRUBINUR Negative 12/25/2017 1327   BILIRUBINUR Negative 09/25/2012 1155   KETONESUR NEGATIVE 09/06/2022 1339   PROTEINUR NEGATIVE 09/06/2022 1339   NITRITE NEGATIVE 09/06/2022 1339   LEUKOCYTESUR NEGATIVE 09/06/2022 1339   LEUKOCYTESUR Negative 09/25/2012 1155    Lab Results  Component Value Date   LABMICR See below: 12/25/2017   WBCUA 0-5 12/25/2017   RBCUA 0-2 12/25/2017   LABEPIT None seen 12/25/2017  MUCUS Present (A) 12/25/2017   BACTERIA NONE SEEN 09/06/2022    Pertinent Imaging: Results for orders placed or performed in visit on 09/19/22  Microscopic Examination   Urine  Result Value Ref Range   WBC, UA 11-30 (A) 0 - 5 /hpf   RBC, Urine >30 (A) 0 - 2 /hpf   Epithelial Cells (non renal) 0-10 0 - 10 /hpf   Mucus, UA Present (A) Not Estab.   Bacteria, UA Moderate (A) None seen/Few  Urinalysis, Complete  Result Value Ref Range   Specific Gravity, UA 1.015 1.005 - 1.030   pH, UA 5.5 5.0 - 7.5   Color, UA Red (A) Yellow   Appearance Ur Cloudy (A) Clear   Leukocytes,UA 3+ (A) Negative   Protein,UA 3+ (A) Negative/Trace   Glucose, UA Negative Negative   Ketones, UA Trace (A) Negative   RBC, UA 3+ (A) Negative   Bilirubin, UA Negative Negative   Urobilinogen, Ur 1.0 0.2 - 1.0 mg/dL   Nitrite, UA Positive (A) Negative   Microscopic Examination See below:     Assessment & Plan:    1. Kidney stones Status post ureteroscopy with stent in place - Urinalysis, Complete - CULTURE, URINE COMPREHENSIVE  2.  Abnormal urinalysis Presence of nitrites on urinalysis today in the absence of Pyridium along with his overall symptoms are somewhat concerning for possible underlying infection.  We discussed that in his best interest, would recommend deferring stent removal due to concern for precipitation of systemic infection  Will go ahead and treat him empirically with Bactrim for 7 days, follow-up urine culture data and adjust as needed  He is also having significant discomfort and would like another refill of Vicodin dispense #10 for breakthrough pain. - CULTURE, URINE COMPREHENSIVE   Hollice Espy, MD  [facility] [address], [place], [place] [address] [phone]

## 2022-09-23 ENCOUNTER — Ambulatory Visit
Admission: RE | Admit: 2022-09-23 | Discharge: 2022-09-23 | Disposition: A | Discharge status: Home / Self Care | Payer: BC Managed Care – PPO | Source: Ambulatory Visit | Attending: Nurse Practitioner | Admitting: Nurse Practitioner

## 2022-09-23 DIAGNOSIS — I7143 Infrarenal abdominal aortic aneurysm, without rupture: Secondary | ICD-10-CM | POA: Insufficient documentation

## 2022-09-23 DIAGNOSIS — K760 Fatty (change of) liver, not elsewhere classified: Secondary | ICD-10-CM | POA: Diagnosis not present

## 2022-09-23 MED ORDER — IOHEXOL 350 MG/ML SOLN
100.0000 mL | Freq: Once | INTRAVENOUS | Status: AC | PRN
  Administered 2022-09-23: 89 mL via INTRAVENOUS

## 2022-09-23 NOTE — Progress Notes (Signed)
Please schedule him to see GS to review CT results

## 2022-09-24 DIAGNOSIS — I70219 Atherosclerosis of native arteries of extremities with intermittent claudication, unspecified extremity: Secondary | ICD-10-CM | POA: Insufficient documentation

## 2022-09-24 LAB — CULTURE, URINE COMPREHENSIVE

## 2022-09-24 NOTE — H&P (View-Only) (Signed)
               MRN : [id number]  [name] is a 62 y.o. (05/06/1961) male who presents with chief complaint of check circulation.  History of Present Illness:  The patient is seen for follow up evaluation of AAA status post CTA.  CTA was performed at [facility] September 23, 2022.  There were no problems or complications related to the CT scan. The patient denies interval development of abdominal or back pain. No new lower extremity pain or discoloration of the toes.   The patient denies recent episodes of angina or shortness of breath. The patient denies interval anaurosis fugax. There is no recent history of TIA symptoms or focal motor deficits. The patient denies PAD or claudication symptoms.   CT angiography of the abdomen and pelvis shows an infrarenal AAA 4.9 cm.  There is some mural thrombus noted in the visceral segment.  There is greater than 80% bilateral common iliac artery stenosis at the level of the aortic bifurcation.   No outpatient medications have been marked as taking for the 09/26/22 encounter (Appointment) with Ingrid Shifrin G, MD.    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   [place], [place]   CYSTOSCOPY W/ URETERAL STENT PLACEMENT Left 01/28/2015   Procedure: CYSTOSCOPY WITH RETROGRADE PYELOGRAM/URETERAL STENT PLACEMENT;  Surgeon: Theodore Manny, MD;  Location: [facility];  Service: Urology;  Laterality: Left;   CYSTOSCOPY/URETEROSCOPY/HOLMIUM LASER/STENT PLACEMENT Left 02/08/2015   Procedure: CYSTOSCOPY/URETEROSCOPY/HOLMIUM LASER/STENT PLACEMENT;  Surgeon:  Ashley [name], MD;  Location: [facility];  Service: Urology;  Laterality: Left;   CYSTOSCOPY/URETEROSCOPY/HOLMIUM LASER/STENT PLACEMENT Left 01/07/2018   Procedure: CYSTOSCOPY/URETEROSCOPY/HOLMIUM LASER/STENT PLACEMENT;  Surgeon: [name], Ashley, MD;  Location: [facility];  Service: Urology;  Laterality: Left;   CYSTOSCOPY/URETEROSCOPY/HOLMIUM LASER/STENT PLACEMENT Bilateral 09/09/2022   Procedure: CYSTOSCOPY/URETEROSCOPY/HOLMIUM LASER/STENT PLACEMENT/RETROGRADE;  Surgeon: [name], Ashley, MD;  Location: [facility];  Service: Urology;  Laterality: Bilateral;   FACIAL COSMETIC SURGERY     21 fractures in left cheek and face from baseball   LITHOTRIPSY     VASECTOMY      Social History Social History   Tobacco Use   Smoking status: Every Day    Packs/day: 10.00    Years: 30.00    Total pack years: 300.00    Types: Cigarettes    Passive exposure: Current   Smokeless tobacco: Never  Vaping Use   Vaping Use: Never used  Substance Use Topics   Alcohol use: Yes    Comment: occasionally   Drug use: No    Family History Family History  Problem Relation Age of Onset   Diabetes Mother    Diabetes Maternal Uncle    Cancer Maternal Uncle    Cancer Maternal Uncle    Prostate cancer Neg Hx    Bladder Cancer Neg Hx    Kidney cancer Neg Hx     No Known Allergies   REVIEW OF SYSTEMS (Negative unless checked)  Constitutional: []Weight loss  []Fever  []Chills Cardiac: []  Chest pain   []Chest pressure   []Palpitations   []Shortness of breath when laying flat   []Shortness of breath with exertion. Vascular:  [x]Pain in legs with walking   []Pain in legs at rest  []History of DVT   []Phlebitis   []Swelling in legs   []Varicose veins   []Non-healing ulcers Pulmonary:   []Uses home oxygen   []Productive cough   []Hemoptysis   []Wheeze  [x]COPD   []Asthma Neurologic:  []Dizziness   []Seizures   []History of stroke   []History of TIA  []Aphasia   []Vissual changes   []Weakness or numbness in arm    []Weakness or numbness in leg Musculoskeletal:   []Joint swelling   []Joint pain   []Low back pain Hematologic:  []Easy bruising  []Easy bleeding   []Hypercoagulable state   []Anemic Gastrointestinal:  []Diarrhea   []Vomiting  [x]Gastroesophageal reflux/heartburn   []Difficulty swallowing. Genitourinary:  []Chronic kidney disease   []Difficult urination  []Frequent urination   []Blood in urine Skin:  []Rashes   []Ulcers  Psychological:  []History of anxiety   [] History of major depression.  Physical Examination  There were no vitals filed for this visit. There is no height or weight on file to calculate BMI. Gen: WD/WN, NAD Head: [place]/AT, No temporalis wasting.  Ear/Nose/Throat: Hearing grossly intact, nares w/o erythema or drainage Eyes: PER, EOMI, sclera nonicteric.  Neck: Supple, no masses.  No bruit or JVD.  Pulmonary:  Good air movement, no audible wheezing, no use of accessory muscles.  Cardiac: RRR, normal S1, S2, no Murmurs. Vascular:  mild trophic changes, no open wounds Vessel Right Left  Radial Palpable Palpable  PT Not Palpable Not Palpable  DP Not Palpable Not Palpable  Gastrointestinal: soft, non-distended. No guarding/no peritoneal signs.  Musculoskeletal: M/S 5/5 throughout.  No visible deformity.  Neurologic: CN 2-12 intact. Pain and light touch intact in extremities.  Symmetrical.  Speech is fluent. Motor exam as listed above. Psychiatric: Judgment intact, Mood & affect appropriate for pt's clinical situation. Dermatologic: No rashes or ulcers noted.  No changes consistent with cellulitis.   CBC Lab Results  Component Value Date   WBC 10.3 08/22/2022   HGB 15.2 08/22/2022   HCT 43.8 08/22/2022   MCV 92.0 08/22/2022   PLT 254 08/22/2022    BMET    Component Value Date/Time   NA 134 (L) 08/22/2022 0948   NA 135 (L) 09/25/2012 1155   K 3.9 08/22/2022 0948   K 3.7 09/25/2012 1155   CL 105 08/22/2022 0948   CL 102 09/25/2012 1155   CO2 18 (L) 08/22/2022  0948   CO2 26 09/25/2012 1155   GLUCOSE 128 (H) 08/22/2022 0948   GLUCOSE 95 09/25/2012 1155   BUN 22 (H) 08/22/2022 0948   BUN 16 09/25/2012 1155   CREATININE 1.02 08/22/2022 0948   CREATININE 0.90 09/25/2012 1155   CALCIUM 8.7 (L) 08/22/2022 0948   CALCIUM 8.8 09/25/2012 1155   GFRNONAA >60 08/22/2022 0948   GFRNONAA >60 09/25/2012 1155   GFRAA >60 05/04/2019 2004   GFRAA >60 09/25/2012 1155   CrCl cannot be calculated (Patient's most recent lab result is older than the maximum 21 days allowed.).  COAG Lab Results  Component Value Date   INR 1.0 11/01/2020    Radiology CT Angio Abd/Pel w/ and/or w/o  Result Date: 09/23/2022 CLINICAL DATA:  61-year-old male with history of abdominal aortic aneurysm. EXAM: CT ANGIOGRAPHY ABDOMEN AND PELVIS WITH CONTRAST AND WITHOUT CONTRAST   TECHNIQUE: Multidetector CT imaging of the abdomen and pelvis was performed using the standard protocol during bolus administration of intravenous contrast. Multiplanar reconstructed images and MIPs were obtained and reviewed to evaluate the vascular anatomy. RADIATION DOSE REDUCTION: This exam was performed according to the departmental dose-optimization program which includes automated exposure control, adjustment of the mA and/or kV according to patient size and/or use of iterative reconstruction technique. CONTRAST:  89mL OMNIPAQUE IOHEXOL 350 MG/ML SOLN COMPARISON:  08/22/2022, 09/09/2022 FINDINGS: VASCULAR Aorta: Fusiform infrarenal abdominal aortic aneurysm measuring up to 46 x 44 mm in maximum short axis dimensions. Scattered fibrofatty and calcific atherosclerotic changes throughout the aorta. Celiac: Patent without evidence of aneurysm, dissection, vasculitis or significant stenosis. SMA: Mild ostial stenosis secondary to calcified atherosclerotic plaque. Patent distally. Renals: The 4 right and 3 left renal arteries are patent without evidence of aneurysm, dissection, vasculitis, fibromuscular dysplasia or  significant stenosis. IMA: Patent without evidence of aneurysm, dissection, vasculitis or significant stenosis. Inflow: Severe proximal stenosis of the bilateral common iliac artery secondary to prominent calcified atherosclerotic plaque. The bilateral external iliac arteries are patent, however diminutive, measuring up to approximately 5 mm just distal to the iliac bifurcations. The internal iliac arteries appear patent. Proximal Outflow: Bilateral common femoral and visualized portions of the superficial and profunda femoral arteries are patent without evidence of aneurysm, dissection, vasculitis or significant stenosis. Veins: No obvious venous abnormality within the limitations of this arterial phase study. Review of the MIP images confirms the above findings. NON-VASCULAR Lower chest: No acute abnormality. Hepatobiliary: Marked decreased attenuation of the hepatic parenchyma relative to the spleen. No focal lesions. Smooth contour. The gallbladder is present and unremarkable. No intra or extrahepatic biliary ductal dilation. Pancreas: Unremarkable. No pancreatic ductal dilatation or surrounding inflammatory changes. Spleen: Normal in size without focal abnormality. Adrenals/Urinary Tract: Adrenal glands are unremarkable. Interval insertion of double-J nephroureteral stents which appear well positioned. There is minimal bilateral pelviectasis. No evidence of residual nephrolithiasis. Bladder is unremarkable. Stomach/Bowel: Stomach is within normal limits. Appendix is not definitively identified. No evidence of bowel wall thickening, distention, or inflammatory changes. Lymphatic: No abdominopelvic lymphadenopathy. Reproductive: Prostate is unremarkable. Other: No abdominal wall hernia or abnormality. No abdominopelvic ascites. Musculoskeletal: No acute or significant osseous findings. IMPRESSION: VASCULAR 1. Fusiform infrarenal abdominal aortic aneurysm measuring up to 4.6 cm. Recommend follow-up CT/MR every 6  months and vascular consultation if not already obtained. This recommendation follows ACR consensus guidelines: White Paper of the ACR Incidental Findings Committee II on Vascular Findings. J Am Coll Radiol 2013; 10:789-794. 2. 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 Diminutive external iliac arteries bilaterally. 3.  Aortic Atherosclerosis (ICD10-I70.0). 4. Of note, there are 4 right and 3 left renal arteries. NON-VASCULAR 1. Severe hepatic steatosis, no morphologic changes of cirrhosis. 2. Interval insertion of bilateral double-J nephroureteral stents with resolution of previously visualized nephrolithiasis and hydronephrosis. Dylan Suttle, MD [facility] Electronically Signed   By: Dylan  Suttle M.D.   On: 09/23/2022 09:30   DG Abd 1 View  Result Date: 09/10/2022 CLINICAL DATA:  Kidney stones. EXAM: ABDOMEN - 1 VIEW COMPARISON:  CT 08/22/2022 FINDINGS: 11 mm stone to the left of L2 corresponds to ureteropelvic junction stone on recent CT, although is slightly more proximal in location suggesting this is mobile. There is a 19 mm stone that is likely in the lower aspect the left kidney. Right renal calculi with largest measuring 14 mm in   the midportion. Calcifications in the pelvis correspond to phleboliths on CT. There also vascular calcifications. Normal bowel gas pattern. IMPRESSION: 1. An 11 mm stone to the left of L2 corresponds to ureteropelvic junction stone on recent CT, although slightly more proximal in location to prior CT suggesting this stone is mobile. 2. Additional bilateral intrarenal calculi. Electronically Signed   By: Melanie  Sanford M.D.   On: 09/10/2022 12:11   DG OR UROLOGY CYSTO IMAGE ([facility] ONLY)  Result Date: 09/09/2022 There is no interpretation for this exam.  This order is for images obtained during a surgical procedure.  Please See "Surgeries" Tab for  more information regarding the procedure.     Assessment/Plan There are no diagnoses linked to this encounter.   Paz Winsett, MD  09/24/2022 11:26 AM   

## 2022-09-24 NOTE — Progress Notes (Unsigned)
MRN : [id number]  [name] is a 62 y.o. (Apr 28, 1961) male who presents with chief complaint of check circulation.  History of Present Illness:  The patient is seen for follow up evaluation of AAA status post CTA.  CTA was performed at [facility] September 23, 2022.  There were no problems or complications related to the CT scan. The patient denies interval development of abdominal or back pain. No new lower extremity pain or discoloration of the toes.   The patient denies recent episodes of angina or shortness of breath. The patient denies interval anaurosis fugax. There is no recent history of TIA symptoms or focal motor deficits. The patient denies PAD or claudication symptoms.   CT angiography of the abdomen and pelvis shows an infrarenal AAA 4.9 cm.  There is some mural thrombus noted in the visceral segment.  There is greater than 80% bilateral common iliac artery stenosis at the level of the aortic bifurcation.   No outpatient medications have been marked as taking for the 09/26/22 encounter (Appointment) with Gilda Crease, Latina Craver, MD.    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   [place], [place]   CYSTOSCOPY W/ URETERAL STENT PLACEMENT Left 01/28/2015   Procedure: CYSTOSCOPY WITH RETROGRADE PYELOGRAM/URETERAL STENT PLACEMENT;  Surgeon: Sebastian Ache, MD;  Location: [facility] ORS;  Service: Urology;  Laterality: Left;   CYSTOSCOPY/URETEROSCOPY/HOLMIUM LASER/STENT PLACEMENT Left 02/08/2015   Procedure: CYSTOSCOPY/URETEROSCOPY/HOLMIUM LASER/STENT PLACEMENT;  Surgeon:  Vanna Scotland, MD;  Location: [facility] ORS;  Service: Urology;  Laterality: Left;   CYSTOSCOPY/URETEROSCOPY/HOLMIUM LASER/STENT PLACEMENT Left 01/07/2018   Procedure: CYSTOSCOPY/URETEROSCOPY/HOLMIUM LASER/STENT PLACEMENT;  Surgeon: Vanna Scotland, MD;  Location: [facility] ORS;  Service: Urology;  Laterality: Left;   CYSTOSCOPY/URETEROSCOPY/HOLMIUM LASER/STENT PLACEMENT Bilateral 09/09/2022   Procedure: CYSTOSCOPY/URETEROSCOPY/HOLMIUM LASER/STENT PLACEMENT/RETROGRADE;  Surgeon: Vanna Scotland, MD;  Location: [facility];  Service: Urology;  Laterality: Bilateral;   FACIAL COSMETIC SURGERY     21 fractures in left cheek and face from baseball   LITHOTRIPSY     VASECTOMY      Social History Social History   Tobacco Use   Smoking status: Every Day    Packs/day: 10.00    Years: 30.00    Total pack years: 300.00    Types: Cigarettes    Passive exposure: Current   Smokeless tobacco: Never  Vaping Use   Vaping Use: Never used  Substance Use Topics   Alcohol use: Yes    Comment: occasionally   Drug use: No    Family History Family History  Problem Relation Age of Onset   Diabetes Mother    Diabetes Maternal Uncle    Cancer Maternal Uncle    Cancer Maternal Uncle    Prostate cancer Neg Hx    Bladder Cancer Neg Hx    Kidney cancer Neg Hx     No Known Allergies   REVIEW OF SYSTEMS (Negative unless checked)  Constitutional: [] Weight loss  [] Fever  [] Chills Cardiac: []   Chest pain   [] Chest pressure   [] Palpitations   [] Shortness of breath when laying flat   [] Shortness of breath with exertion. Vascular:  [x] Pain in legs with walking   [] Pain in legs at rest  [] History of DVT   [] Phlebitis   [] Swelling in legs   [] Varicose veins   [] Non-healing ulcers Pulmonary:   [] Uses home oxygen   [] Productive cough   [] Hemoptysis   [] Wheeze  [x] COPD   [] Asthma Neurologic:  [] Dizziness   [] Seizures   [] History of stroke   [] History of TIA  [] Aphasia   [] Vissual changes   [] Weakness or numbness in arm    [] Weakness or numbness in leg Musculoskeletal:   [] Joint swelling   [] Joint pain   [] Low back pain Hematologic:  [] Easy bruising  [] Easy bleeding   [] Hypercoagulable state   [] Anemic Gastrointestinal:  [] Diarrhea   [] Vomiting  [x] Gastroesophageal reflux/heartburn   [] Difficulty swallowing. Genitourinary:  [] Chronic kidney disease   [] Difficult urination  [] Frequent urination   [] Blood in urine Skin:  [] Rashes   [] Ulcers  Psychological:  [] History of anxiety   []  History of major depression.  Physical Examination  There were no vitals filed for this visit. There is no height or weight on file to calculate BMI. Gen: WD/WN, NAD Head: [place]/AT, No temporalis wasting.  Ear/Nose/Throat: Hearing grossly intact, nares w/o erythema or drainage Eyes: PER, EOMI, sclera nonicteric.  Neck: Supple, no masses.  No bruit or JVD.  Pulmonary:  Good air movement, no audible wheezing, no use of accessory muscles.  Cardiac: RRR, normal S1, S2, no Murmurs. Vascular:  mild trophic changes, no open wounds Vessel Right Left  Radial Palpable Palpable  PT Not Palpable Not Palpable  DP Not Palpable Not Palpable  Gastrointestinal: soft, non-distended. No guarding/no peritoneal signs.  Musculoskeletal: M/S 5/5 throughout.  No visible deformity.  Neurologic: CN 2-12 intact. Pain and light touch intact in extremities.  Symmetrical.  Speech is fluent. Motor exam as listed above. Psychiatric: Judgment intact, Mood & affect appropriate for pt's clinical situation. Dermatologic: No rashes or ulcers noted.  No changes consistent with cellulitis.   CBC Lab Results  Component Value Date   WBC 10.3 08/22/2022   HGB 15.2 08/22/2022   HCT 43.8 08/22/2022   MCV 92.0 08/22/2022   PLT 254 08/22/2022    BMET    Component Value Date/Time   NA 134 (L) 08/22/2022 0948   NA 135 (L) 09/25/2012 1155   K 3.9 08/22/2022 0948   K 3.7 09/25/2012 1155   CL 105 08/22/2022 0948   CL 102 09/25/2012 1155   CO2 18 (L) 08/22/2022  0948   CO2 26 09/25/2012 1155   GLUCOSE 128 (H) 08/22/2022 0948   GLUCOSE 95 09/25/2012 1155   BUN 22 (H) 08/22/2022 0948   BUN 16 09/25/2012 1155   CREATININE 1.02 08/22/2022 0948   CREATININE 0.90 09/25/2012 1155   CALCIUM 8.7 (L) 08/22/2022 0948   CALCIUM 8.8 09/25/2012 1155   GFRNONAA >60 08/22/2022 0948   GFRNONAA >60 09/25/2012 1155   GFRAA >60 05/04/2019 2004   GFRAA >60 09/25/2012 1155   CrCl cannot be calculated (Patient's most recent lab result is older than the maximum 21 days allowed.).  COAG Lab Results  Component Value Date   INR 1.0 11/01/2020    Radiology CT Angio Abd/Pel w/ and/or w/o  Result Date: 09/23/2022 CLINICAL DATA:  62 year old male with history of abdominal aortic aneurysm. EXAM: CT ANGIOGRAPHY ABDOMEN AND PELVIS WITH CONTRAST AND WITHOUT CONTRAST  TECHNIQUE: Multidetector CT imaging of the abdomen and pelvis was performed using the standard protocol during bolus administration of intravenous contrast. Multiplanar reconstructed images and MIPs were obtained and reviewed to evaluate the vascular anatomy. RADIATION DOSE REDUCTION: This exam was performed according to the departmental dose-optimization program which includes automated exposure control, adjustment of the mA and/or kV according to patient size and/or use of iterative reconstruction technique. CONTRAST:  89mL OMNIPAQUE IOHEXOL 350 MG/ML SOLN COMPARISON:  08/22/2022, 09/09/2022 FINDINGS: VASCULAR Aorta: Fusiform infrarenal abdominal aortic aneurysm measuring up to 46 x 44 mm in maximum short axis dimensions. Scattered fibrofatty and calcific atherosclerotic changes throughout the aorta. Celiac: Patent without evidence of aneurysm, dissection, vasculitis or significant stenosis. SMA: Mild ostial stenosis secondary to calcified atherosclerotic plaque. Patent distally. Renals: The 4 right and 3 left renal arteries are patent without evidence of aneurysm, dissection, vasculitis, fibromuscular dysplasia or  significant stenosis. IMA: Patent without evidence of aneurysm, dissection, vasculitis or significant stenosis. Inflow: Severe proximal stenosis of the bilateral common iliac artery secondary to prominent calcified atherosclerotic plaque. The bilateral external iliac arteries are patent, however diminutive, measuring up to approximately 5 mm just distal to the iliac bifurcations. The internal iliac arteries appear patent. Proximal Outflow: Bilateral common femoral and visualized portions of the superficial and profunda femoral arteries are patent without evidence of aneurysm, dissection, vasculitis or significant stenosis. Veins: No obvious venous abnormality within the limitations of this arterial phase study. Review of the MIP images confirms the above findings. NON-VASCULAR Lower chest: No acute abnormality. Hepatobiliary: Marked decreased attenuation of the hepatic parenchyma relative to the spleen. No focal lesions. Smooth contour. The gallbladder is present and unremarkable. No intra or extrahepatic biliary ductal dilation. Pancreas: Unremarkable. No pancreatic ductal dilatation or surrounding inflammatory changes. Spleen: Normal in size without focal abnormality. Adrenals/Urinary Tract: Adrenal glands are unremarkable. Interval insertion of double-J nephroureteral stents which appear well positioned. There is minimal bilateral pelviectasis. No evidence of residual nephrolithiasis. Bladder is unremarkable. Stomach/Bowel: Stomach is within normal limits. Appendix is not definitively identified. No evidence of bowel wall thickening, distention, or inflammatory changes. Lymphatic: No abdominopelvic lymphadenopathy. Reproductive: Prostate is unremarkable. Other: No abdominal wall hernia or abnormality. No abdominopelvic ascites. Musculoskeletal: No acute or significant osseous findings. IMPRESSION: VASCULAR 1. Fusiform infrarenal abdominal aortic aneurysm measuring up to 4.6 cm. Recommend follow-up CT/MR every 6  months and vascular consultation if not already obtained. This recommendation follows ACR consensus guidelines: White Paper of the ACR Incidental Findings Committee II on Vascular Findings. J Am Coll Radiol 2013; 10:789-794. 2. 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 Diminutive external iliac arteries bilaterally. 3.  Aortic Atherosclerosis (ICD10-I70.0). 4. Of note, there are 4 right and 3 left renal arteries. NON-VASCULAR 1. Severe hepatic steatosis, no morphologic changes of cirrhosis. 2. Interval insertion of bilateral double-J nephroureteral stents with resolution of previously visualized nephrolithiasis and hydronephrosis. Marliss Coots, MD [facility] Radiology Electronically Signed   By: Marliss Coots M.D.   On: 09/23/2022 09:30   DG Abd 1 View  Result Date: 09/10/2022 CLINICAL DATA:  Kidney stones. EXAM: ABDOMEN - 1 VIEW COMPARISON:  CT 08/22/2022 FINDINGS: 11 mm stone to the left of L2 corresponds to ureteropelvic junction stone on recent CT, although is slightly more proximal in location suggesting this is mobile. There is a 19 mm stone that is likely in the lower aspect the left kidney. Right renal calculi with largest measuring 14 mm in  the midportion. Calcifications in the pelvis correspond to phleboliths on CT. There also vascular calcifications. Normal bowel gas pattern. IMPRESSION: 1. An 11 mm stone to the left of L2 corresponds to ureteropelvic junction stone on recent CT, although slightly more proximal in location to prior CT suggesting this stone is mobile. 2. Additional bilateral intrarenal calculi. Electronically Signed   By: Narda Rutherford M.D.   On: 09/10/2022 12:11   DG OR UROLOGY CYSTO IMAGE ([facility] ONLY)  Result Date: 09/09/2022 There is no interpretation for this exam.  This order is for images obtained during a surgical procedure.  Please See "Surgeries" Tab for  more information regarding the procedure.     Assessment/Plan There are no diagnoses linked to this encounter.   Levora Dredge, MD  09/24/2022 11:26 AM

## 2022-09-26 ENCOUNTER — Encounter (INDEPENDENT_AMBULATORY_CARE_PROVIDER_SITE_OTHER): Payer: Self-pay | Admitting: Vascular Surgery

## 2022-09-26 ENCOUNTER — Ambulatory Visit (INDEPENDENT_AMBULATORY_CARE_PROVIDER_SITE_OTHER): Payer: BC Managed Care – PPO | Admitting: Urology

## 2022-09-26 ENCOUNTER — Ambulatory Visit (INDEPENDENT_AMBULATORY_CARE_PROVIDER_SITE_OTHER): Payer: BC Managed Care – PPO | Admitting: Vascular Surgery

## 2022-09-26 VITALS — BP 93/69 | HR 98 | Resp 16 | Wt 225.0 lb

## 2022-09-26 VITALS — BP 119/77 | HR 132 | Wt 233.0 lb

## 2022-09-26 DIAGNOSIS — I70213 Atherosclerosis of native arteries of extremities with intermittent claudication, bilateral legs: Secondary | ICD-10-CM | POA: Diagnosis not present

## 2022-09-26 DIAGNOSIS — N200 Calculus of kidney: Secondary | ICD-10-CM | POA: Diagnosis not present

## 2022-09-26 DIAGNOSIS — I7143 Infrarenal abdominal aortic aneurysm, without rupture: Secondary | ICD-10-CM | POA: Diagnosis not present

## 2022-09-26 DIAGNOSIS — E1159 Type 2 diabetes mellitus with other circulatory complications: Secondary | ICD-10-CM | POA: Diagnosis not present

## 2022-09-26 DIAGNOSIS — I152 Hypertension secondary to endocrine disorders: Secondary | ICD-10-CM

## 2022-09-26 DIAGNOSIS — I25119 Atherosclerotic heart disease of native coronary artery with unspecified angina pectoris: Secondary | ICD-10-CM | POA: Diagnosis not present

## 2022-09-26 DIAGNOSIS — J449 Chronic obstructive pulmonary disease, unspecified: Secondary | ICD-10-CM

## 2022-09-26 DIAGNOSIS — I2510 Atherosclerotic heart disease of native coronary artery without angina pectoris: Secondary | ICD-10-CM | POA: Insufficient documentation

## 2022-09-26 NOTE — Progress Notes (Signed)
   09/26/22  CC:  Chief Complaint  Patient presents with   Cysto Stent Removal    HPI: 62 year old male who presents today for cystoscopy, stent removal.  He had some intermittent bleeding with the stent and some bladder irritation otherwise has been doing well.  Blood pressure 119/77, pulse (!) 132, weight 233 lb (105.7 kg). NED. A&Ox3.   No respiratory distress   Abd soft, NT, ND Normal phallus with bilateral descended testicles  Cystoscopy/ Stent removal procedure  Patient identification was confirmed, informed consent was obtained, and patient was prepped using Betadine solution.  Lidocaine jelly was administered per urethral meatus.    Preoperative abx where received prior to procedure.    Procedure: - Flexible cystoscope introduced, without any difficulty.   - Thorough search of the bladder revealed:    normal urethral meatus  Stent seen emanating from left ureteral orifice, grasped with stent graspers, and removed in entirety.   I then readvanced the cystoscope after my attention to the right side.  The stent was grasped and removed in entirety as well.   Post-Procedure: - Patient tolerated the procedure well   Assessment/ Plan:  1. Kidney stones Bilateral nephrolithiasis status post ureteroscopy  Bilateral stents removed today without difficulty  He still on antibiotics  Warning symptoms reviewed  Follow-up in 4 weeks with renal ultrasound prior   Hollice Espy, MD

## 2022-10-01 ENCOUNTER — Encounter: Payer: Self-pay | Admitting: Cardiovascular Disease

## 2022-10-01 ENCOUNTER — Other Ambulatory Visit: Payer: Self-pay | Admitting: Urology

## 2022-10-01 ENCOUNTER — Ambulatory Visit: Payer: BC Managed Care – PPO | Attending: Cardiovascular Disease | Admitting: Cardiovascular Disease

## 2022-10-01 VITALS — BP 120/80 | HR 99 | Ht 69.0 in | Wt 228.4 lb

## 2022-10-01 DIAGNOSIS — R0609 Other forms of dyspnea: Secondary | ICD-10-CM

## 2022-10-01 DIAGNOSIS — Z720 Tobacco use: Secondary | ICD-10-CM

## 2022-10-01 DIAGNOSIS — Z01818 Encounter for other preprocedural examination: Secondary | ICD-10-CM | POA: Diagnosis not present

## 2022-10-01 DIAGNOSIS — I739 Peripheral vascular disease, unspecified: Secondary | ICD-10-CM

## 2022-10-01 DIAGNOSIS — I7143 Infrarenal abdominal aortic aneurysm, without rupture: Secondary | ICD-10-CM

## 2022-10-01 DIAGNOSIS — E785 Hyperlipidemia, unspecified: Secondary | ICD-10-CM

## 2022-10-01 DIAGNOSIS — I10 Essential (primary) hypertension: Secondary | ICD-10-CM | POA: Diagnosis not present

## 2022-10-01 DIAGNOSIS — I25118 Atherosclerotic heart disease of native coronary artery with other forms of angina pectoris: Secondary | ICD-10-CM

## 2022-10-01 NOTE — Progress Notes (Signed)
Cardiology Office Note   Date:  10/01/2022   ID:  [name], DOB Nov 18, 1960, MRN [id number]  PCP:  Sofie Hartigan, MD  Cardiologist:   Kathlyn Sacramento, MD   Chief Complaint  Patient presents with   Other    Cardiac Clearance AAA repair. Meds reviewed verbally with pt.      History of Present Illness: [name] is a 62 y.o. male who was referred by Dr. Delana Meyer for preoperative cardiovascular evaluation for abdominal aortic aneurysm repair. He has chronic medical conditions including coronary artery disease, type 2 diabetes, BPH, GERD, essential hypertension, hyperlipidemia, tobacco use, COPD and coronary artery disease. He reports having PCI about 20 years ago in [place] with no recurrent cardiac events since then.  He had recent kidney stone that required cystoscopy with no issues related to anesthesia.  He was recently diagnosed with peripheral arterial disease with severe bilateral hip claudication. Recent CTA angiography of the abdomen and pelvis showed a 4.9 cm infrarenal AAA with significant bilateral common iliac artery disease.  He denies any chest pain but complains of significant exertional dyspnea.  He smokes half a pack per day.  No recent ischemic cardiac evaluation.   Past Medical History:  Diagnosis Date   AAA (abdominal aortic aneurysm) (HCC)    Acid reflux 12/01/2013   Arthritis    Benign fibroma of prostate 12/01/2013   Calculus of kidney 03/14/2015   Carotid artery occlusion    COPD, mild ([place]) 03/25/2014   Diabetes mellitus without complication ([place])    History of kidney stones    HLD (hyperlipidemia) 12/01/2013   Hypertension    Renal stones     Past Surgical History:  Procedure Laterality Date   APPENDECTOMY     CARDIAC CATHETERIZATION     CORONARY ANGIOPLASTY  2004?   [place], [place]   CYSTOSCOPY W/ URETERAL STENT PLACEMENT Left 01/28/2015   Procedure: CYSTOSCOPY WITH RETROGRADE PYELOGRAM/URETERAL STENT PLACEMENT;   Surgeon: Alexis Frock, MD;  Location: [facility] ORS;  Service: Urology;  Laterality: Left;   CYSTOSCOPY/URETEROSCOPY/HOLMIUM LASER/STENT PLACEMENT Left 02/08/2015   Procedure: CYSTOSCOPY/URETEROSCOPY/HOLMIUM LASER/STENT PLACEMENT;  Surgeon: Hollice Espy, MD;  Location: [facility] OR[facility];  Service: Urology;  Laterality: Left;   CYSTOSCOPY/URETEROSCOPY/HOLMIUM LASER/STENT PLACEMENT Left 01/07/2018   Procedure: CYSTOSCOPY/URETEROSCOPY/HOLMIUM LASER/STENT PLACEMENT;  Surgeon: Hollice Espy, MD;  Location: [facility];  Service: Urology;  Laterality: Left;   CYSTOSCOPY/URETEROSCOPY/HOLMIUM LASER/STENT PLACEMENT Bilateral 09/09/2022   Procedure: CYSTOSCOPY/URETEROSCOPY/HOLMIUM LASER/STENT PLACEMENT/RETROGRADE;  Surgeon: Hollice Espy, MD;  Location: [facility];  Service: Urology;  Laterality: Bilateral;   FACIAL COSMETIC SURGERY     21 fractures in left cheek and face from baseball   LITHOTRIPSY     VASECTOMY       Current Outpatient Medications  Medication Sig Dispense Refill   albuterol (VENTOLIN HFA) 108 (90 Base) MCG/ACT inhaler Inhale 2 puffs into the lungs every 6 (six) hours as needed for wheezing or shortness of breath. 16 g 2   aspirin EC 325 MG tablet Take 1 tablet (325 mg total) by mouth daily. 30 tablet 0   atorvastatin (LIPITOR) 40 MG tablet Take 40 mg by mouth daily.     esomeprazole (NEXIUM) 20 MG capsule Take 20 mg by mouth every morning.     fenofibrate (TRICOR) 145 MG tablet Take 145 mg by mouth every morning.   3   HYDROcodone-acetaminophen (NORCO/VICODIN) 5-325 MG tablet Take 1-2 tablets by mouth every 6 (six) hours as needed for moderate pain. 10 tablet 0   Ipratropium-Albuterol (  COMBIVENT) 20-100 MCG/ACT AERS respimat Inhale 1 puff into the lungs 4 (four) times daily as needed for wheezing.      lisinopril (PRINIVIL,ZESTRIL) 10 MG tablet Take 10 mg by mouth every morning.      meloxicam (MOBIC) 15 MG tablet Take 15 mg by mouth daily.     metFORMIN (GLUCOPHAGE) 500 MG tablet Take 500 mg by  mouth 2 (two) times daily with a meal.  0   Multiple Vitamin (MULTI-VITAMINS) TABS Take 1 tablet by mouth every morning.      mupirocin ointment (BACTROBAN) 2 % Apply 1 Application topically 2 (two) times daily. 22 g 0   nicotine (NICODERM CQ - DOSED IN MG/24 HR) 7 mg/24hr patch Place 7 mg onto the skin daily.     oxybutynin (DITROPAN) 5 MG tablet Take 1 tablet (5 mg total) by mouth every 8 (eight) hours as needed for bladder spasms. 30 tablet 0   pioglitazone (ACTOS) 15 MG tablet Take 15 mg by mouth daily.     sildenafil (REVATIO) 20 MG tablet Take 20-60 mg by mouth daily as needed.     tamsulosin (FLOMAX) 0.4 MG CAPS capsule Take 1 capsule (0.4 mg total) by mouth daily. 30 capsule 0   traMADol (ULTRAM) 50 MG tablet Take 1 tablet (50 mg total) by mouth every 6 (six) hours as needed. (Patient not taking: Reported on 10/01/2022) 10 tablet 0   No current facility-administered medications for this visit.   Facility-Administered Medications Ordered in Other Visits  Medication Dose Route Frequency Provider Last Rate Last Admin   dexamethasone (DECADRON) injection    Anesthesia Intra-op Silvana Newness, CRNA   5 mg at 02/08/15 1339   fentaNYL (SUBLIMAZE) injection    Anesthesia Intra-op Silvana Newness, CRNA   50 mcg at 02/08/15 1416   glycopyrrolate (ROBINUL) injection    Anesthesia Intra-op Silvana Newness, CRNA   0.6 mg at 02/08/15 1419   lidocaine (cardiac) 100 mg/40m (XYLOCAINE) 20 MG/ML injection 2%   Intravenous Anesthesia Intra-op SSilvana Newness CRNA   100 mg at 02/08/15 1317   midazolam (VERSED) injection    Anesthesia Intra-op SSilvana Newness CRNA   2 mg at 01/07/18 1305   neostigmine (BLOXIVERZ) injection   Intravenous Anesthesia Intra-op SSilvana Newness CRNA   4 mg at 02/08/15 1419   ondansetron (ZOFRAN) injection   Intravenous Anesthesia Intra-op SSilvana Newness CRNA   4 mg at 02/08/15 1421   propofol (DIPRIVAN) 10 mg/mL bolus/IV push    Anesthesia Intra-op SSilvana Newness CRNA   180 mg at 02/08/15  1318   rocuronium ([facility] injection    Anesthesia Intra-op SSilvana Newness CRNA   10 mg at 02/08/15 1349    Allergies:   Patient has no known allergies.    Social History:  The patient  reports that he has been smoking cigarettes. He has a 5.00 pack-year smoking history. He has been exposed to tobacco smoke. He has never used smokeless tobacco. He reports current alcohol use. He reports that he does not use drugs.   Family History:  The patient's family history includes Cancer in his maternal uncle and maternal uncle; Diabetes in his maternal uncle and mother.    ROS:  Please see the history of present illness.   Otherwise, review of systems are positive for none.   All other systems are reviewed and negative.    PHYSICAL EXAM: VS:  BP 120/80 (BP Location: Right Arm, Patient Position: Sitting, Cuff Size: Large)   Pulse 99   Ht  $'5\' 9"'C$  (1.753 m)   Wt 228 lb 6 oz (103.6 kg)   SpO2 99%   BMI 33.73 kg/m  , BMI Body mass index is 33.73 kg/m. GEN: Well nourished, well developed, in no acute distress  HEENT: normal  Neck: no JVD, carotid bruits, or masses Cardiac: RRR; no murmurs, rubs, or gallops,no edema  Respiratory:  clear to auscultation bilaterally, normal work of breathing GI: soft, nontender, nondistended, + BS MS: no deformity or atrophy  Skin: warm and dry, no rash Neuro:  Strength and sensation are intact Psych: euthymic mood, full affect   EKG:  EKG is ordered today. The ekg ordered today demonstrates normal sinus rhythm with no significant ST or T wave changes.   Recent Labs: 08/22/2022: ALT 22; BUN 22; Creatinine, Ser 1.02; Hemoglobin 15.2; Platelets 254; Potassium 3.9; Sodium 134    Lipid Panel No results found for: "CHOL", "TRIG", "HDL", "CHOLHDL", "VLDL", "LDLCALC", "LDLDIRECT"    Wt Readings from Last 3 Encounters:  10/01/22 228 lb 6 oz (103.6 kg)  09/26/22 225 lb (102.1 kg)  09/26/22 233 lb (105.7 kg)          10/01/2022    2:29 PM  PAD Screen   Previous PAD dx? Yes  Previous surgical procedure? Yes  Pain with walking? Yes  Subsides with rest? No  Feet/toe relief with dangling? No  Painful, non-healing ulcers? Yes  Extremities discolored? No      ASSESSMENT AND PLAN:  1.  Preop cardiovascular evaluation: The patient is limited by severe bilateral hip claudication and thus cannot fully evaluate his functional capacity.  He has significant exertional dyspnea without chest pain and has known history of coronary artery disease with remote stenting.  Due to that, I recommend evaluation with a Lexiscan Myoview before surgery.  He is not able to exercise on a treadmill due to claudication.  This will be expedited.  2.  Coronary artery disease involving native coronary arteries with other form of angina: Remote stent placement 20 years ago in [place].  Continue treatment of risk factors and evaluate with a stress test as outlined above.  3.  Essential hypertension: Blood pressure is controlled on current medications.  4.  Hyperlipidemia: Currently on atorvastatin 40 mg once daily.  However, recent lipid profile showed an LDL of 178.  Not entirely sure if he was taking atorvastatin at that time but this will have to be addressed upon subsequent visits.  Recommend an LDL target of less than 70.  5.  Peripheral arterial disease with severe bilateral common iliac artery stenosis as well as large abdominal aortic aneurysm.  Followed by vascular surgery with plans for endovascular intervention in the near future.  6.  Tobacco use: I discussed the importance of smoking cessation.    Disposition:   FU with me in 6 months  Signed,  Kathlyn Sacramento, MD  10/01/2022 3:02 PM    [facility]

## 2022-10-01 NOTE — Patient Instructions (Signed)
Medication Instructions:  No changes *If you need a refill on your cardiac medications before your next appointment, please call your pharmacy*   Lab Work: None ordered If you have labs (blood work) drawn today and your tests are completely normal, you will receive your results only by: [place] (if you have MyChart) OR A paper copy in the mail If you have any lab test that is abnormal or we need to change your treatment, we will call you to review the results.   Testing/Procedures: Your provider has ordered a [place] test. This will take place at [facility]. Please report to the [facility] medical mall entrance. The volunteers at the first desk will direct you where to go.  [place]  Your provider has ordered a Stress Test with nuclear imaging. The purpose of this test is to evaluate the blood supply to your heart muscle. This procedure is referred to as a "Non-Invasive Stress Test." This is because other than having an IV started in your vein, nothing is inserted or "invades" your body. Cardiac stress tests are done to find areas of poor blood flow to the heart by determining the extent of coronary artery disease (CAD). Some patients exercise on a treadmill, which naturally increases the blood flow to your heart, while others who are unable to walk on a treadmill due to physical limitations will have a pharmacologic/chemical stress agent called Lexiscan . This medicine will mimic walking on a treadmill by temporarily increasing your coronary blood flow.   Please note: these test may take anywhere between 2-4 hours to complete  How to prepare for your [place] test:  Nothing to eat for 6 hours prior to the test No caffeine for 24 hours prior to test No smoking 24 hours prior to test. Your medication may be taken with water.  If your doctor stopped a medication because of this test, do not take that medication. Ladies, please do not wear dresses.  Skirts or pants are  appropriate. Please wear a short sleeve shirt. No perfume, cologne or lotion. Wear comfortable walking shoes. No heels!   PLEASE NOTIFY THE OFFICE AT LEAST 67 HOURS IN ADVANCE IF YOU ARE UNABLE TO KEEP YOUR APPOINTMENT.  [phone] AND  PLEASE NOTIFY NUCLEAR MEDICINE AT [facility] AT LEAST 24 HOURS IN ADVANCE IF YOU ARE UNABLE TO KEEP YOUR APPOINTMENT. [phone]    Follow-Up: At [facility], you and your health needs are our priority.  As part of our continuing mission to provide you with exceptional heart care, we have created designated Provider Care Teams.  These Care Teams include your primary Cardiologist (physician) and Advanced Practice Providers (APPs -  Physician Assistants and Nurse Practitioners) who all work together to provide you with the care you need, when you need it.  We recommend signing up for the patient portal called "MyChart".  Sign up information is provided on this After Visit Summary.  MyChart is used to connect with patients for Virtual Visits (Telemedicine).  Patients are able to view lab/test results, encounter notes, upcoming appointments, etc.  Non-urgent messages can be sent to your provider as well.   To learn more about what you can do with MyChart, go to [email or web address].    Your next appointment:   6 month(s)  Provider:   You may see Kathlyn Sacramento, MD or one of the following Advanced Practice Providers on your designated Care Team:   Murray Hodgkins, NP Christell Faith, PA-C Cadence Kathlen Mody, PA-C Gerrie Nordmann, NP

## 2022-10-04 ENCOUNTER — Other Ambulatory Visit: Payer: Self-pay | Admitting: Urology

## 2022-10-07 ENCOUNTER — Encounter
Admission: RE | Admit: 2022-10-07 | Discharge: 2022-10-07 | Disposition: A | Discharge status: Home / Self Care | Payer: BC Managed Care – PPO | Source: Ambulatory Visit | Attending: Cardiovascular Disease | Admitting: Cardiovascular Disease

## 2022-10-07 DIAGNOSIS — R0609 Other forms of dyspnea: Secondary | ICD-10-CM | POA: Diagnosis not present

## 2022-10-07 DIAGNOSIS — Z01818 Encounter for other preprocedural examination: Secondary | ICD-10-CM

## 2022-10-07 LAB — NM MYOCAR MULTI W/SPECT W/WALL MOTION / EF
Base ST Depression (mm): 0 mm
LV dias vol: 57 mL (ref 62–150)
LV sys vol: 23 mL
Nuc Stress EF: 60 %
Peak HR: 134 {beats}/min
Percent HR: 83 %
Rest HR: 90 {beats}/min
SDS: 0
SRS: 2
SSS: 0
ST Depression (mm): 0 mm
TID: 0.85

## 2022-10-07 MED ORDER — REGADENOSON 0.4 MG/5ML IV SOLN
0.4000 mg | Freq: Once | INTRAVENOUS | Status: AC
  Administered 2022-10-07: 0.4 mg via INTRAVENOUS

## 2022-10-07 MED ORDER — TECHNETIUM TC 99M TETROFOSMIN IV KIT
10.0000 | PACK | Freq: Once | INTRAVENOUS | Status: AC | PRN
  Administered 2022-10-07: 10.56 via INTRAVENOUS

## 2022-10-07 MED ORDER — TECHNETIUM TC 99M TETROFOSMIN IV KIT
32.4100 | PACK | Freq: Once | INTRAVENOUS | Status: AC | PRN
  Administered 2022-10-07: 32.41 via INTRAVENOUS

## 2022-10-09 ENCOUNTER — Telehealth (INDEPENDENT_AMBULATORY_CARE_PROVIDER_SITE_OTHER): Payer: Self-pay | Admitting: Vascular Surgery

## 2022-10-09 NOTE — Telephone Encounter (Signed)
Patient LVM asking when was he going to have the procedure that [facility] told him to have.  Please advise.  He already had his CT scan.

## 2022-10-10 NOTE — Telephone Encounter (Signed)
Spoke with the patient and advised him that I am waiting on an authorization from his insurance and will call him when I have that in place.

## 2022-10-11 ENCOUNTER — Encounter (INDEPENDENT_AMBULATORY_CARE_PROVIDER_SITE_OTHER): Payer: Self-pay

## 2022-10-11 ENCOUNTER — Encounter: Payer: Self-pay | Admitting: *Deleted

## 2022-10-11 NOTE — Progress Notes (Signed)
Contacted patient's insurance checking on prior authorization and was told it was still pending review.

## 2022-10-15 ENCOUNTER — Telehealth (INDEPENDENT_AMBULATORY_CARE_PROVIDER_SITE_OTHER): Payer: Self-pay

## 2022-10-15 NOTE — Telephone Encounter (Signed)
Spoke with the patient and he is scheduled with Dr. Delana Meyer on 10/23/22 at the [facility] for a AAA graft repair. Pre-op phone call is on 10/18/22 between 8-1 pm. Pre-surgical instructions were discussed and will be sent to [place] and mailed.

## 2022-10-18 ENCOUNTER — Encounter
Admission: RE | Admit: 2022-10-18 | Discharge: 2022-10-18 | Disposition: A | Discharge status: Home / Self Care | Payer: BC Managed Care – PPO | Source: Ambulatory Visit | Attending: Vascular Surgery | Admitting: Vascular Surgery

## 2022-10-18 ENCOUNTER — Other Ambulatory Visit: Payer: Self-pay

## 2022-10-18 ENCOUNTER — Other Ambulatory Visit (INDEPENDENT_AMBULATORY_CARE_PROVIDER_SITE_OTHER): Payer: Self-pay | Admitting: Nurse Practitioner

## 2022-10-18 VITALS — Ht 69.0 in | Wt 233.0 lb

## 2022-10-18 DIAGNOSIS — E1159 Type 2 diabetes mellitus with other circulatory complications: Secondary | ICD-10-CM

## 2022-10-18 DIAGNOSIS — I7143 Infrarenal abdominal aortic aneurysm, without rupture: Secondary | ICD-10-CM

## 2022-10-18 HISTORY — DX: Abdominal aortic aneurysm, without rupture, unspecified: I71.40

## 2022-10-18 NOTE — Patient Instructions (Addendum)
Your procedure is scheduled on: 10/23/2022   Report to the Registration Desk on the 1st floor of the [facility]. To find out your arrival time, please call [phone] between 1PM - 3PM on: 10/22/2022  If your arrival time is 6:00 am, do not arrive before that time as the [place] entrance doors do not open until 6:00 am.  REMEMBER: Instructions that are not followed completely may result in serious medical risk, up to and including death; or upon the discretion of your surgeon and anesthesiologist your surgery may need to be rescheduled.  Do not eat food after midnight the night before surgery.  No gum chewing or hard candies.   One week prior to surgery: Stop Anti-inflammatories (NSAIDS) such as Advil, Aleve, Ibuprofen, Motrin, Naproxen, Naprosyn and Aspirin based products such as Excedrin, Goody's Powder, BC Powder., meloxicam (MOBIC)  Stop ANY OVER THE COUNTER supplements until after surgery. You may however, continue to take Tylenol if needed for pain up until the day of surgery.  Continue taking all prescribed medications with the exception of the following:    Metformin - hold two days prior to surgery. Last dose will 10/20/2022     Aspirin- DO NOT TAKE it on day of surgery   pioglitazone (ACTOS) -   DO not take it on day of surgery    Lisinopril- do not take it on day of surgery.    TAKE ONLY THESE MEDICATIONS THE MORNING OF SURGERY WITH A SIP OF WATER:  fenofibrate (TRICOR)  2. . esomeprazole (Anthony) take one the night before and one on the morning of surgery - helps to prevent nausea after surgery.) 3. atorvastatin (LIPITOR   Use inhalers ( albuterol (VENTOLIN HFA) ) on the day of surgery.  No Alcohol for 24 hours before or after surgery.  No Smoking including e-cigarettes for 24 hours before surgery.  No chewable tobacco products for at least 6 hours before surgery.  No nicotine patches on the day of surgery.  Do not use any "recreational" drugs for at least a  week (preferably 2 weeks) before your surgery.  Please be advised that the combination of cocaine and anesthesia may have negative outcomes, up to and including death. If you test positive for cocaine, your surgery will be cancelled.  On the morning of surgery brush your teeth with toothpaste and water, you may rinse your mouth with mouthwash if you wish. Do not swallow any toothpaste or mouthwash.  Use CHG Soap  as directed on instruction sheet.= provided for you  Do not wear jewelry, make-up, hairpins, clips or nail polish.  Do not wear lotions, powders, or perfumes.   Do not shave body hair from the neck down 48 hours before surgery.  Contact lenses, hearing aids and dentures may not be worn into surgery.  Do not bring valuables to the hospital. [facility] is not responsible for any missing/lost belongings or valuables.    Notify your doctor if there is any change in your medical condition (cold, fever, infection).  Wear comfortable clothing (specific to your surgery type) to the hospital.  After surgery, you can help prevent lung complications by doing breathing exercises.  Take deep breaths and cough every 1-2 hours. Your doctor may order a device called an Incentive Spirometer to help you take deep breaths.  If you are being admitted to the hospital overnight, leave your suitcase in the car. After surgery it may be brought to your room.  If you are being discharged  the day of surgery, you will not be allowed to drive home. You will need a responsible individual to drive you home and stay with you for 24 hours after surgery.   Please call the [place] Dept. at [phone] if you have any questions about these instructions.  Surgery Visitation Policy:  Patients having surgery or a procedure may have two visitors.  Children under the age of 19 must have an adult with them who is not the patient.  Inpatient Visitation:    Visiting hours are 7 a.m. to 8  p.m. Up to four visitors are allowed at one time in a patient room. The visitors may rotate out with other people during the day.  One visitor age 71 or older may stay with the patient overnight and must be in the room by 8 p.m.     Preparing for Surgery with CHLORHEXIDINE GLUCONATE (CHG) Soap  Chlorhexidine Gluconate (CHG) Soap  o An antiseptic cleaner that kills germs and bonds with the skin to continue killing germs even after washing  o Used for showering the night before surgery and morning of surgery  Before surgery, you can play an important role by reducing the number of germs on your skin.  CHG (Chlorhexidine gluconate) soap is an antiseptic cleanser which kills germs and bonds with the skin to continue killing germs even after washing.  Please do not use if you have an allergy to CHG or antibacterial soaps. If your skin becomes reddened/irritated stop using the CHG.  1. Shower the NIGHT BEFORE SURGERY and the MORNING OF SURGERY with CHG soap.  2. If you choose to wash your hair, wash your hair first as usual with your normal shampoo.  3. After shampooing, rinse your hair and body thoroughly to remove the shampoo.  4. Use CHG as you would any other liquid soap. You can apply CHG directly to the skin and wash gently with a scrungie or a clean washcloth.  5. Apply the CHG soap to your body only from the neck down. Do not use on open wounds or open sores. Avoid contact with your eyes, ears, mouth, and genitals (private parts). Wash face and genitals (private parts) with your normal soap.  6. Wash thoroughly, paying special attention to the area where your surgery will be performed.  7. Thoroughly rinse your body with warm water.  8. Do not shower/wash with your normal soap after using and rinsing off the CHG soap.  9. Pat yourself dry with a clean towel.  10. Wear clean pajamas to bed the night before surgery.  12. Place clean sheets on your bed the night of your first  shower and do not sleep with pets.  13. Shower again with the CHG soap on the day of surgery prior to arriving at the hospital.  14. Do not apply any deodorants/lotions/powders.  15. Please wear clean clothes to the hospital.

## 2022-10-21 ENCOUNTER — Encounter
Admission: RE | Admit: 2022-10-21 | Discharge: 2022-10-21 | Disposition: A | Discharge status: Home / Self Care | Payer: BC Managed Care – PPO | Source: Ambulatory Visit | Attending: Vascular Surgery | Admitting: Vascular Surgery

## 2022-10-21 ENCOUNTER — Encounter: Payer: Self-pay | Admitting: Vascular Surgery

## 2022-10-21 ENCOUNTER — Encounter: Payer: Self-pay | Admitting: Urgent Care

## 2022-10-21 DIAGNOSIS — I7143 Infrarenal abdominal aortic aneurysm, without rupture: Secondary | ICD-10-CM | POA: Insufficient documentation

## 2022-10-21 DIAGNOSIS — E1151 Type 2 diabetes mellitus with diabetic peripheral angiopathy without gangrene: Secondary | ICD-10-CM | POA: Diagnosis not present

## 2022-10-21 DIAGNOSIS — I70213 Atherosclerosis of native arteries of extremities with intermittent claudication, bilateral legs: Secondary | ICD-10-CM | POA: Diagnosis not present

## 2022-10-21 DIAGNOSIS — Z01812 Encounter for preprocedural laboratory examination: Secondary | ICD-10-CM | POA: Insufficient documentation

## 2022-10-21 DIAGNOSIS — J449 Chronic obstructive pulmonary disease, unspecified: Secondary | ICD-10-CM | POA: Diagnosis not present

## 2022-10-21 DIAGNOSIS — I70223 Atherosclerosis of native arteries of extremities with rest pain, bilateral legs: Secondary | ICD-10-CM | POA: Diagnosis not present

## 2022-10-21 DIAGNOSIS — Z01818 Encounter for other preprocedural examination: Secondary | ICD-10-CM | POA: Diagnosis not present

## 2022-10-21 DIAGNOSIS — F17210 Nicotine dependence, cigarettes, uncomplicated: Secondary | ICD-10-CM | POA: Diagnosis not present

## 2022-10-21 DIAGNOSIS — K760 Fatty (change of) liver, not elsewhere classified: Secondary | ICD-10-CM | POA: Diagnosis not present

## 2022-10-21 DIAGNOSIS — I708 Atherosclerosis of other arteries: Secondary | ICD-10-CM | POA: Diagnosis not present

## 2022-10-21 DIAGNOSIS — I158 Other secondary hypertension: Secondary | ICD-10-CM | POA: Diagnosis not present

## 2022-10-21 DIAGNOSIS — N529 Male erectile dysfunction, unspecified: Secondary | ICD-10-CM | POA: Diagnosis not present

## 2022-10-21 DIAGNOSIS — M1990 Unspecified osteoarthritis, unspecified site: Secondary | ICD-10-CM | POA: Diagnosis not present

## 2022-10-21 DIAGNOSIS — I10 Essential (primary) hypertension: Secondary | ICD-10-CM | POA: Diagnosis not present

## 2022-10-21 DIAGNOSIS — I714 Abdominal aortic aneurysm, without rupture, unspecified: Secondary | ICD-10-CM | POA: Diagnosis not present

## 2022-10-21 DIAGNOSIS — Z955 Presence of coronary angioplasty implant and graft: Secondary | ICD-10-CM | POA: Diagnosis not present

## 2022-10-21 DIAGNOSIS — N400 Enlarged prostate without lower urinary tract symptoms: Secondary | ICD-10-CM | POA: Diagnosis not present

## 2022-10-21 DIAGNOSIS — I700 Atherosclerosis of aorta: Secondary | ICD-10-CM | POA: Diagnosis not present

## 2022-10-21 DIAGNOSIS — K219 Gastro-esophageal reflux disease without esophagitis: Secondary | ICD-10-CM | POA: Diagnosis not present

## 2022-10-21 DIAGNOSIS — I2510 Atherosclerotic heart disease of native coronary artery without angina pectoris: Secondary | ICD-10-CM | POA: Diagnosis not present

## 2022-10-21 DIAGNOSIS — E785 Hyperlipidemia, unspecified: Secondary | ICD-10-CM | POA: Diagnosis not present

## 2022-10-21 DIAGNOSIS — Z960 Presence of urogenital implants: Secondary | ICD-10-CM | POA: Diagnosis present

## 2022-10-21 DIAGNOSIS — Z87442 Personal history of urinary calculi: Secondary | ICD-10-CM | POA: Diagnosis not present

## 2022-10-21 LAB — BASIC METABOLIC PANEL
Anion gap: 9 (ref 5–15)
BUN: 16 mg/dL (ref 6–20)
CO2: 20 mmol/L — ABNORMAL LOW (ref 22–32)
Calcium: 9 mg/dL (ref 8.9–10.3)
Chloride: 109 mmol/L (ref 98–111)
Creatinine, Ser: 0.79 mg/dL (ref 0.61–1.24)
GFR, Estimated: >60 mL/min (ref >60)
Glucose, Bld: 101 mg/dL — ABNORMAL HIGH (ref 70–99)
Potassium: 3.4 mmol/L — ABNORMAL LOW (ref 3.5–5.1)
Sodium: 138 mmol/L (ref 135–145)

## 2022-10-21 LAB — CBC WITH DIFFERENTIAL/PLATELET
Abs Immature Granulocytes: 0.09 10*3/uL — ABNORMAL HIGH (ref 0.00–0.07)
Basophils Absolute: 0.1 10*3/uL (ref 0.0–0.1)
Basophils Relative: 1 %
Eosinophils Absolute: 0.3 10*3/uL (ref 0.0–0.5)
Eosinophils Relative: 3 %
HCT: 47.1 % (ref 39.0–52.0)
Hemoglobin: 15.7 g/dL (ref 13.0–17.0)
Immature Granulocytes: 1 %
Lymphocytes Relative: 36 %
Lymphs Abs: 3.8 10*3/uL (ref 0.7–4.0)
MCH: 30.7 pg (ref 26.0–34.0)
MCHC: 33.3 g/dL (ref 30.0–36.0)
MCV: 92.2 fL (ref 80.0–100.0)
Monocytes Absolute: 0.9 10*3/uL (ref 0.1–1.0)
Monocytes Relative: 9 %
Neutro Abs: 5.4 10*3/uL (ref 1.7–7.7)
Neutrophils Relative %: 50 %
Platelets: 327 10*3/uL (ref 150–400)
RBC: 5.11 MIL/uL (ref 4.22–5.81)
RDW: 14 % (ref 11.5–15.5)
WBC: 10.6 10*3/uL — ABNORMAL HIGH (ref 4.0–10.5)
nRBC: 0 % (ref 0.0–0.2)

## 2022-10-21 LAB — TYPE AND SCREEN
ABO/RH(D): A POS
Antibody Screen: NEGATIVE

## 2022-10-21 NOTE — Progress Notes (Addendum)
Perioperative / Anesthesia Services  Pre-Admission Testing Clinical Review / Preoperative Anesthesia Consult  Date: 10/21/22  Patient Demographics:  Name: [name] DOB:   01/01/1961 MRN:   [id number]  Planned Surgical Procedure(s):    Case: [id number] Date/Time: 10/23/22 0800   Procedure: ENDOVASCULAR REPAIR/STENT GRAFT   Anesthesia type: General   Diagnosis: Abdominal aortic aneurysm (AAA) without rupture, unspecified part [I71.40]   Pre-op diagnosis:      AAA    GORE   AAA repair     Schnier w Dew to assist   Location: [facility]   Providers: Katha Cabal, MD     NOTE: Available PAT nursing documentation and vital signs have been reviewed. Clinical nursing staff has updated patient's PMH/PSHx, current medication list, and drug allergies/intolerances to ensure comprehensive history available to assist in medical decision making as it pertains to the aforementioned surgical procedure and anticipated anesthetic course. Extensive review of available clinical information personally performed. [place] [facility] and PSHx updated with any diagnoses/procedures that  may have been inadvertently omitted during his intake with the pre-admission testing department's nursing staff.  Clinical Discussion:  [name] is a 62 y.o. male who is submitted for pre-surgical anesthesia review and clearance prior to him undergoing the above procedure. Patient is a Current Smoker (5 pack years). Pertinent PMH includes: CAD, diastolic dysfunction, carotid artery disease, aortic atherosclerosis, BILATERAL iliac artery stenosis, HTN, HLD, T2DM, COPD, GERD (on daily PPI), BPH, nephrolithiasis, ED (on PDE5i).  Patient is followed by cardiology Fletcher Anon, MD). He was last seen in the cardiology clinic on 10/01/2022; notes reviewed. At the time of his clinic visit, patient doing well overall from a cardiovascular perspective.  Patient complained of chronic exertional dyspnea related  to his underlying COPD diagnosis.  Symptoms reported to be stable and at baseline.  Patient denied any chest pain, PND, orthopnea, palpitations, significant peripheral edema, weakness, fatigue, vertiginous symptoms, or presyncope/syncope. Patient with a past medical history significant for cardiovascular diagnoses. Documented physical exam was grossly benign, providing no evidence of acute exacerbation and/or decompensation of the patient's known cardiovascular conditions.  Of note, patient's complete records regarding his cardiovascular history unavailable for review at time of consult.  Patient has received care outside of the state of [place].  Information below gathered from patient report and from notes provided by his local cardiologist.  Patient reported to have undergone diagnostic LEFT heart catheterization and subsequent PCI back in approximately 2004.  Affected artery and stent type unknown at this time.  Patient with known enlarging infrarenal abdominal aortic aneurysm.  Aneurysmal defect diagnosed via CT imaging on 01/28/2015, at which time it measured 3.8 cm.  Most recent CT imaging of the abdomen pelvis revealed interval increase in size of aneurysmal defect to 4.9 cm.  Blood pressure well controlled at 120/80 mmHg on currently prescribed ACEi (lisinopril) monotherapy. Patient is on atorvastatin + fenofibrate for his HLD diagnosis and ASCVD prevention. T2DM controlled on currently prescribed regimen; last HgbA1c was 7.0% when checked on 09/04/2022. He does not have an OSAH diagnosis.  Functional capacity somewhat limited by patient's underlying medical comorbidities. However, with that being said, patient still felt to be able to achieve at least 4 METS of physical activity without experiencing any significant degree of angina/anginal equivalent symptoms.  No changes were made to his medication regimen.  In light of his upcoming surgery, cardiology with plans to pursue further noninvasive  cardiovascular workup to assist with risk stratification.  Patient  to follow-up with outpatient cardiology in 6 months.  Since being seen by cardiology, patient has undergone the ordered noninvasive cardiovascular testing.  TTE performed on 10/07/2022 revealed a normal left ventricular systolic function with an EF of 60%.  There were no regional wall motion abnormalities.  There is no evidence of stress-induced myocardial ischemia or arrhythmia; no scintigraphic evidence of scar.  CT attenuation correction images showed moderate aortic calcifications and mild coronary artery calcifications.  Study determined to be normal and low risk.  [name] is scheduled for ENDOVASCULAR AAA REPAIR/STENT GRAFT on 10/23/2022 with Dr. Hortencia Pilar, MD.  Given patient's past medical history significant for cardiovascular diagnoses, presurgical cardiac clearance was sought by the PAT team. Per cardiology, "based ACC/AHA guidelines, the patient's past medical history, and the amount of time since his last clinic visit, this patient would be at an overall LOW risk for the planned procedure without further cardiovascular testing or intervention at this time".    In review of his medication reconciliation, it is noted that patient is currently on prescribed daily antithrombotic therapy.  Per standing orders from vascular surgeon for this procedure, patient to continue his daily full dose aspirin throughout his perioperative course.  Patient denies previous perioperative complications with anesthesia in the past. In review of the available records, it is noted that patient underwent a general anesthetic course here at [facility] ([facility]) in 08/2022 without documented complications.      10/18/2022    4:23 PM 10/01/2022    2:34 PM 10/01/2022    2:29 PM  Vitals with BMI  Height 5\' 9"   5\' 9"   Weight 233 lbs  228 lbs 6 oz  BMI 0000000  [id number]  Systolic  [id number]  Diastolic  80 78   Pulse   99    Providers/Specialists:   NOTE: Primary physician provider listed below. Patient may have been seen by APP or partner within same practice.   PROVIDER ROLE / SPECIALTY LAST OV  Schnier, Dolores Lory, MD Vascular Surgery (Surgeon) 09/26/2022  Sofie Hartigan, MD Primary Care Provider 09/04/2022  Kathlyn Sacramento, MD Cardiology 10/01/2022   Allergies:  Patient has no known allergies.  Current Home Medications:   No current facility-administered medications for this encounter.    albuterol (VENTOLIN HFA) 108 (90 Base) MCG/ACT inhaler   aspirin EC 325 MG tablet   atorvastatin (LIPITOR) 40 MG tablet   esomeprazole (NEXIUM) 20 MG capsule   fenofibrate (TRICOR) 145 MG tablet   lisinopril (PRINIVIL,ZESTRIL) 10 MG tablet   meloxicam (MOBIC) 15 MG tablet   metFORMIN (GLUCOPHAGE) 500 MG tablet   Multiple Vitamin (MULTI-VITAMINS) TABS   nicotine (NICODERM CQ - DOSED IN MG/24 HR) 7 mg/24hr patch   pioglitazone (ACTOS) 15 MG tablet   sildenafil (REVATIO) 20 MG tablet    dexamethasone (DECADRON) injection   fentaNYL (SUBLIMAZE) injection   glycopyrrolate (ROBINUL) injection   lidocaine (cardiac) 100 mg/40ml (XYLOCAINE) 20 MG/ML injection 2%   midazolam (VERSED) injection   neostigmine (BLOXIVERZ) injection   ondansetron (ZOFRAN) injection   propofol (DIPRIVAN) 10 mg/mL bolus/IV push   rocuronium (ZEMURON) injection   History:   Past Medical History:  Diagnosis Date   Aortic atherosclerosis    Arthritis    Benign fibroma of prostate 12/01/2013   BPH (benign prostatic hyperplasia)    CAD (coronary artery disease)    a.) PCI in ~ 2004 in [place], [place] -> location and stent type unknown   Carotid  artery occlusion    COPD (chronic obstructive pulmonary disease) [id number]   Diastolic dysfunction [id number]   a.) TTE 11/02/2020: EF 60-65%, G1DD.   Diverticulosis    Erectile dysfunction    a.) on PDE5i (sildenafil)   GERD (gastroesophageal reflux disease) 12/01/2013    Hepatic steatosis    History of kidney stones 03/14/2015   HLD (hyperlipidemia) 12/01/2013   Hypertension    Iliac artery stenosis, bilateral    a.) CTA AP 09/23/2022: severe stenosis of the BILATERAL ostial CIAs.   Infrarenal abdominal aortic aneurysm (AAA) without rupture    a.) CT renal 01/28/2015: 3.8 cm; b.) lumbar MRI 04/11/2016: 3.7 cm; c.) CT renal 12/24/2017: 3.7 x 3.8 cm; d.) CT AP 08/22/2022: 4.9 cm; e.) CTA AP 09/23/2022: 4.6 cm   T2DM (type 2 diabetes mellitus)    Past Surgical History:  Procedure Laterality Date   APPENDECTOMY     CARDIAC CATHETERIZATION     CORONARY ANGIOPLASTY  2004?   [place], [place]   CYSTOSCOPY W/ URETERAL STENT PLACEMENT Left 01/28/2015   Procedure: CYSTOSCOPY WITH RETROGRADE PYELOGRAM/URETERAL STENT PLACEMENT;  Surgeon: Alexis Frock, MD;  Location: [facility];  Service: Urology;  Laterality: Left;   CYSTOSCOPY/URETEROSCOPY/HOLMIUM LASER/STENT PLACEMENT Left 02/08/2015   Procedure: CYSTOSCOPY/URETEROSCOPY/HOLMIUM LASER/STENT PLACEMENT;  Surgeon: Hollice Espy, MD;  Location: [facility];  Service: Urology;  Laterality: Left;   CYSTOSCOPY/URETEROSCOPY/HOLMIUM LASER/STENT PLACEMENT Left 01/07/2018   Procedure: CYSTOSCOPY/URETEROSCOPY/HOLMIUM LASER/STENT PLACEMENT;  Surgeon: Hollice Espy, MD;  Location: [facility];  Service: Urology;  Laterality: Left;   CYSTOSCOPY/URETEROSCOPY/HOLMIUM LASER/STENT PLACEMENT Bilateral 09/09/2022   Procedure: CYSTOSCOPY/URETEROSCOPY/HOLMIUM LASER/STENT PLACEMENT/RETROGRADE;  Surgeon: Hollice Espy, MD;  Location: [facility];  Service: Urology;  Laterality: Bilateral;   FACIAL COSMETIC SURGERY     21 fractures in left cheek and face from baseball   LITHOTRIPSY     VASECTOMY     Family History  Problem Relation Age of Onset   Diabetes Mother    Diabetes Maternal Uncle    Cancer Maternal Uncle    Cancer Maternal Uncle    Prostate cancer Neg Hx    Bladder Cancer Neg Hx    Kidney cancer Neg Hx    Social History    Tobacco Use   Smoking status: Every Day    Packs/day: 10.00    Years: 0.50    Additional pack years: 0.00    Total pack years: 5.00    Types: Cigarettes    Passive exposure: Current   Smokeless tobacco: Never  Vaping Use   Vaping Use: Never used  Substance Use Topics   Alcohol use: Yes    Comment: occasionally   Drug use: No    Pertinent Clinical Results:  LABS:   Hospital Outpatient Visit on 10/21/2022  Component Date Value Ref Range Status   Sodium 10/21/2022 138  135 - 145 mmol/L Final   Potassium 10/21/2022 3.4 (L)  3.5 - 5.1 mmol/L Final   Chloride 10/21/2022 109  98 - 111 mmol/L Final   CO2 10/21/2022 20 (L)  22 - 32 mmol/L Final   Glucose, Bld 10/21/2022 101 (H)  70 - 99 mg/dL Final   Glucose reference range applies only to samples taken after fasting for at least 8 hours.   BUN 10/21/2022 16  6 - 20 mg/dL Final   Creatinine, Ser 10/21/2022 0.79  0.61 - 1.24 mg/dL Final   Calcium 10/21/2022 9.0  8.9 - 10.3 mg/dL Final   GFR, Estimated 10/21/2022 >60  >60 mL/min Final   Comment: (NOTE) Calculated using  the CKD-EPI Creatinine Equation (2021)    Anion gap 10/21/2022 9  5 - 15 Final   Performed at [facility], [address] [place], [place] [address]   WBC 10/21/2022 10.6 (H)  4.0 - 10.5 K/uL Final   RBC 10/21/2022 5.11  4.22 - 5.81 MIL/uL Final   Hemoglobin 10/21/2022 15.7  13.0 - 17.0 g/dL Final   HCT 10/21/2022 47.1  39.0 - 52.0 % Final   MCV 10/21/2022 92.2  80.0 - 100.0 fL Final   MCH 10/21/2022 30.7  26.0 - 34.0 pg Final   MCHC 10/21/2022 33.3  30.0 - 36.0 g/dL Final   RDW 10/21/2022 14.0  11.5 - 15.5 % Final   Platelets 10/21/2022 327  150 - 400 K/uL Final   nRBC 10/21/2022 0.0  0.0 - 0.2 % Final   Neutrophils Relative % 10/21/2022 50  % Final   Neutro Abs 10/21/2022 5.4  1.7 - 7.7 K/uL Final   Lymphocytes Relative 10/21/2022 36  % Final   Lymphs Abs 10/21/2022 3.8  0.7 - 4.0 K/uL Final   Monocytes Relative 10/21/2022 9  % Final    Monocytes Absolute 10/21/2022 0.9  0.1 - 1.0 K/uL Final   Eosinophils Relative 10/21/2022 3  % Final   Eosinophils Absolute 10/21/2022 0.3  0.0 - 0.5 K/uL Final   Basophils Relative 10/21/2022 1  % Final   Basophils Absolute 10/21/2022 0.1  0.0 - 0.1 K/uL Final   Immature Granulocytes 10/21/2022 1  % Final   Abs Immature Granulocytes 10/21/2022 0.09 (H)  0.00 - 0.07 K/uL Final   Performed at [facility], [address] [place], [place] [address]    ECG: Date: 10/01/2022 Time ECG obtained: 1436 PM Rate: 99 bpm Rhythm: normal sinus Axis (leads I and aVF): Normal Intervals: PR 154 ms. QRS 88 ms. QTc 406 ms. ST segment and T wave changes: No evidence of acute ST segment elevation or depression Comparison: Similar to previous tracing obtained on 08/22/2022   IMAGING / PROCEDURES: MYOCARDIAL PERFUSION IMAGING STUDY (LEXISCAN) performed on 10/07/2022 Normal left ventricular systolic function with an EF of 123456 End systolic and diastolic cavity size normal CT attenuation correction images showed moderate aortic calcification and mild coronary calcifications No evidence of stress-induced myocardial ischemia or arrhythmia Normal low risk study  CT ANGIO ABD/PEL W/ AND/OR W/O performed on 09/23/2022 Fusiform infrarenal abdominal aortic aneurysm measuring up to 4.6 cm. Recommend follow-up CT/MR every 6 months and vascular consultation if not already obtained. This recommendation follows ACR consensus guidelines: White Paper of the ACR Incidental Findings Committee II on Vascular Findings. J Am Coll Radiol UE:[id number]. 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 Diminutive external iliac arteries bilaterally. Aortic atherosclerosis  Of note, there are 4 right and 3 left renal arteries. Severe hepatic steatosis, no morphologic changes of cirrhosis. Interval insertion of bilateral double-J nephroureteral stents with  resolution of previously visualized nephrolithiasis and hydronephrosis.   TRANSTHORACIC ECHOCARDIOGRAM performed on 11/02/2020 Left ventricular ejection fraction, by estimation, is 60 to 65%. The left ventricle has normal function. The left ventricle has no regional  wall motion abnormalities. Left ventricular diastolic parameters are  consistent with Grade I diastolic  dysfunction (impaired relaxation). The average left ventricular global  longitudinal strain is -15.1 %. The global longitudinal strain is normal. Right ventricular systolic function is normal. The right ventricular size is normal.   Impression and Plan:  [name] has been referred for  pre-anesthesia review and clearance prior to him undergoing the planned anesthetic and procedural courses. Available labs, pertinent testing, and imaging results were personally reviewed by me in preparation for upcoming operative/procedural course. [facility] medical record has been updated following extensive record review and patient interview with PAT staff.   This patient has been appropriately cleared by cardiology with an overall LOW risk of significant perioperative cardiovascular complications. Based on clinical review performed today (10/21/22), barring any significant acute changes in the patient's overall condition, it is anticipated that he will be able to proceed with the planned surgical intervention. Any acute changes in clinical condition may necessitate his procedure being postponed and/or cancelled. Patient will meet with anesthesia team (MD and/or CRNA) on the day of his procedure for preoperative evaluation/assessment. Questions regarding anesthetic course will be fielded at that time.   Pre-surgical instructions were reviewed with the patient during his PAT appointment, and questions were fielded to satisfaction by PAT clinical staff. He has been instructed on which medications that he will need to hold prior to surgery, as  well as the ones that have been deemed safe/appropriate to take of the day of his procedure. As part of the general education provided by PAT, patient made aware both verbally and in writing, that he would need to abstain from the use of any illegal substances during his perioperative course.  He was advised that failure to follow the provided instructions could necessitate case cancellation or result serious perioperative complications up to and including death. Patient encouraged to contact PAT and/or his surgeon's office to discuss any questions or concerns that may arise prior to surgery; verbalized understanding.   Honor Loh, MSN, APRN, FNP-C, CEN [facility]  Peri-operative Services Nurse Practitioner Phone: [phone] Fax: [phone] 10/21/22 2:26 PM  NOTE: This note has been prepared using Dragon dictation software. Despite my best ability to proofread, there is always the potential that unintentional transcriptional errors may still occur from this process.

## 2022-10-23 ENCOUNTER — Encounter
Admission: RE | Disposition: A | Discharge status: Home / Self Care | Payer: Self-pay | Source: Home / Self Care | Attending: Vascular Surgery

## 2022-10-23 ENCOUNTER — Inpatient Hospital Stay: Payer: BC Managed Care – PPO | Admitting: Certified Registered"

## 2022-10-23 ENCOUNTER — Other Ambulatory Visit: Payer: Self-pay

## 2022-10-23 ENCOUNTER — Inpatient Hospital Stay
Admission: RE | Admit: 2022-10-23 | Discharge: 2022-10-24 | DRG: 269 | Disposition: A | Discharge status: Home / Self Care | Payer: BC Managed Care – PPO | Attending: Vascular Surgery | Admitting: Vascular Surgery

## 2022-10-23 ENCOUNTER — Encounter: Payer: Self-pay | Admitting: Vascular Surgery

## 2022-10-23 DIAGNOSIS — Z960 Presence of urogenital implants: Secondary | ICD-10-CM | POA: Diagnosis present

## 2022-10-23 DIAGNOSIS — K219 Gastro-esophageal reflux disease without esophagitis: Secondary | ICD-10-CM | POA: Diagnosis present

## 2022-10-23 DIAGNOSIS — I70223 Atherosclerosis of native arteries of extremities with rest pain, bilateral legs: Secondary | ICD-10-CM | POA: Diagnosis present

## 2022-10-23 DIAGNOSIS — Z7982 Long term (current) use of aspirin: Secondary | ICD-10-CM | POA: Diagnosis not present

## 2022-10-23 DIAGNOSIS — Z79899 Other long term (current) drug therapy: Secondary | ICD-10-CM

## 2022-10-23 DIAGNOSIS — I714 Abdominal aortic aneurysm, without rupture, unspecified: Secondary | ICD-10-CM

## 2022-10-23 DIAGNOSIS — I2510 Atherosclerotic heart disease of native coronary artery without angina pectoris: Secondary | ICD-10-CM | POA: Diagnosis present

## 2022-10-23 DIAGNOSIS — I700 Atherosclerosis of aorta: Secondary | ICD-10-CM | POA: Diagnosis present

## 2022-10-23 DIAGNOSIS — Z7902 Long term (current) use of antithrombotics/antiplatelets: Secondary | ICD-10-CM | POA: Diagnosis not present

## 2022-10-23 DIAGNOSIS — I708 Atherosclerosis of other arteries: Secondary | ICD-10-CM | POA: Diagnosis present

## 2022-10-23 DIAGNOSIS — I7143 Infrarenal abdominal aortic aneurysm, without rupture: Principal | ICD-10-CM | POA: Diagnosis present

## 2022-10-23 DIAGNOSIS — I70213 Atherosclerosis of native arteries of extremities with intermittent claudication, bilateral legs: Secondary | ICD-10-CM | POA: Diagnosis not present

## 2022-10-23 DIAGNOSIS — E1151 Type 2 diabetes mellitus with diabetic peripheral angiopathy without gangrene: Secondary | ICD-10-CM | POA: Diagnosis present

## 2022-10-23 DIAGNOSIS — E785 Hyperlipidemia, unspecified: Secondary | ICD-10-CM | POA: Diagnosis present

## 2022-10-23 DIAGNOSIS — I158 Other secondary hypertension: Secondary | ICD-10-CM | POA: Diagnosis present

## 2022-10-23 DIAGNOSIS — Z833 Family history of diabetes mellitus: Secondary | ICD-10-CM | POA: Diagnosis not present

## 2022-10-23 DIAGNOSIS — F17210 Nicotine dependence, cigarettes, uncomplicated: Secondary | ICD-10-CM | POA: Diagnosis present

## 2022-10-23 DIAGNOSIS — J449 Chronic obstructive pulmonary disease, unspecified: Secondary | ICD-10-CM | POA: Diagnosis present

## 2022-10-23 DIAGNOSIS — M1990 Unspecified osteoarthritis, unspecified site: Secondary | ICD-10-CM | POA: Diagnosis present

## 2022-10-23 DIAGNOSIS — Z955 Presence of coronary angioplasty implant and graft: Secondary | ICD-10-CM

## 2022-10-23 DIAGNOSIS — E1159 Type 2 diabetes mellitus with other circulatory complications: Secondary | ICD-10-CM

## 2022-10-23 DIAGNOSIS — N400 Enlarged prostate without lower urinary tract symptoms: Secondary | ICD-10-CM | POA: Diagnosis present

## 2022-10-23 DIAGNOSIS — Z87442 Personal history of urinary calculi: Secondary | ICD-10-CM | POA: Diagnosis not present

## 2022-10-23 DIAGNOSIS — K760 Fatty (change of) liver, not elsewhere classified: Secondary | ICD-10-CM | POA: Diagnosis present

## 2022-10-23 DIAGNOSIS — N529 Male erectile dysfunction, unspecified: Secondary | ICD-10-CM | POA: Diagnosis present

## 2022-10-23 HISTORY — DX: Fatty (change of) liver, not elsewhere classified: K76.0

## 2022-10-23 HISTORY — DX: Type 2 diabetes mellitus without complications: E11.9

## 2022-10-23 HISTORY — DX: Atherosclerosis of aorta: I70.0

## 2022-10-23 HISTORY — DX: Benign prostatic hyperplasia without lower urinary tract symptoms: N40.0

## 2022-10-23 HISTORY — DX: Male erectile dysfunction, unspecified: N52.9

## 2022-10-23 HISTORY — DX: Atherosclerotic heart disease of native coronary artery without angina pectoris: I25.10

## 2022-10-23 HISTORY — PX: ENDOVASCULAR STENT GRAFT (AAA): CATH118280

## 2022-10-23 HISTORY — DX: Diverticulosis of intestine, part unspecified, without perforation or abscess without bleeding: K57.90

## 2022-10-23 HISTORY — DX: Stricture of artery: I77.1

## 2022-10-23 HISTORY — DX: Infrarenal abdominal aortic aneurysm, without rupture: I71.43

## 2022-10-23 LAB — CBC
HCT: 42 % (ref 39.0–52.0)
Hemoglobin: 13.7 g/dL (ref 13.0–17.0)
MCH: 31.1 pg (ref 26.0–34.0)
MCHC: 32.6 g/dL (ref 30.0–36.0)
MCV: 95.5 fL (ref 80.0–100.0)
Platelets: 271 10*3/uL (ref 150–400)
RBC: 4.4 MIL/uL (ref 4.22–5.81)
RDW: 13.9 % (ref 11.5–15.5)
WBC: 14.1 10*3/uL — ABNORMAL HIGH (ref 4.0–10.5)
nRBC: 0 % (ref 0.0–0.2)

## 2022-10-23 LAB — PROTIME-INR
INR: 1.1 (ref 0.8–1.2)
Prothrombin Time: 14.1 seconds (ref 11.4–15.2)

## 2022-10-23 LAB — BASIC METABOLIC PANEL
Anion gap: 6 (ref 5–15)
BUN: 16 mg/dL (ref 6–20)
CO2: 21 mmol/L — ABNORMAL LOW (ref 22–32)
Calcium: 8.2 mg/dL — ABNORMAL LOW (ref 8.9–10.3)
Chloride: 111 mmol/L (ref 98–111)
Creatinine, Ser: 1.05 mg/dL (ref 0.61–1.24)
GFR, Estimated: >60 mL/min (ref >60)
Glucose, Bld: 173 mg/dL — ABNORMAL HIGH (ref 70–99)
Potassium: 4.7 mmol/L (ref 3.5–5.1)
Sodium: 138 mmol/L (ref 135–145)

## 2022-10-23 LAB — MAGNESIUM: Magnesium: 1.4 mg/dL — ABNORMAL LOW (ref 1.7–2.4)

## 2022-10-23 LAB — APTT: aPTT: 57 seconds — ABNORMAL HIGH (ref 24–36)

## 2022-10-23 LAB — ABO/RH: ABO/RH(D): A POS

## 2022-10-23 LAB — GLUCOSE, CAPILLARY
Glucose-Capillary: 129 mg/dL — ABNORMAL HIGH (ref 70–99)
Glucose-Capillary: 187 mg/dL — ABNORMAL HIGH (ref 70–99)
Glucose-Capillary: 160 mg/dL — ABNORMAL HIGH (ref 70–99)

## 2022-10-23 SURGERY — ENDOVASCULAR STENT GRAFT (AAA)
Anesthesia: General

## 2022-10-23 MED ORDER — METOPROLOL TARTRATE 5 MG/5ML IV SOLN: 2.0000 mg | INTRAVENOUS | Status: DC | PRN

## 2022-10-23 MED ORDER — PANTOPRAZOLE SODIUM 40 MG IV SOLR
40.0000 mg | Freq: Every day | INTRAVENOUS | Status: DC
  Administered 2022-10-23: 40 mg via INTRAVENOUS
  Filled 2022-10-23: qty 10

## 2022-10-23 MED ORDER — DOPAMINE-DEXTROSE 3.2-5 MG/ML-% IV SOLN: 3.0000 ug/kg/min | INTRAVENOUS | Status: DC

## 2022-10-23 MED ORDER — ESMOLOL HCL 100 MG/10ML IV SOLN
INTRAVENOUS | Status: DC | PRN
  Administered 2022-10-23 (×3): 10 mg via INTRAVENOUS

## 2022-10-23 MED ORDER — LIDOCAINE HCL (PF) 2 % IJ SOLN
INTRAMUSCULAR | Status: AC
  Filled 2022-10-23: qty 5

## 2022-10-23 MED ORDER — ESMOLOL HCL 100 MG/10ML IV SOLN
INTRAVENOUS | Status: AC
  Filled 2022-10-23: qty 10

## 2022-10-23 MED ORDER — OXYCODONE HCL 5 MG PO TABS
ORAL_TABLET | ORAL | Status: AC
  Filled 2022-10-23: qty 1

## 2022-10-23 MED ORDER — SODIUM CHLORIDE (PF) 0.9 % IJ SOLN
INTRAMUSCULAR | Status: AC
  Filled 2022-10-23: qty 10

## 2022-10-23 MED ORDER — ONDANSETRON HCL 4 MG/2ML IJ SOLN
INTRAMUSCULAR | Status: AC
  Filled 2022-10-23: qty 2

## 2022-10-23 MED ORDER — VANCOMYCIN HCL IN DEXTROSE 1-5 GM/200ML-% IV SOLN
1000.0000 mg | Freq: Two times a day (BID) | INTRAVENOUS | Status: AC
  Administered 2022-10-23 – 2022-10-24 (×2): 1000 mg via INTRAVENOUS
  Filled 2022-10-23: qty 200

## 2022-10-23 MED ORDER — ACETAMINOPHEN 325 MG RE SUPP: 325.0000 mg | RECTAL | Status: DC | PRN

## 2022-10-23 MED ORDER — DEXMEDETOMIDINE HCL IN NACL 80 MCG/20ML IV SOLN
INTRAVENOUS | Status: DC | PRN
  Administered 2022-10-23 (×2): 8 ug via BUCCAL

## 2022-10-23 MED ORDER — MORPHINE SULFATE (PF) 4 MG/ML IV SOLN: 2.0000 mg | INTRAVENOUS | Status: DC | PRN

## 2022-10-23 MED ORDER — OXYCODONE-ACETAMINOPHEN 5-325 MG PO TABS
1.0000 | ORAL_TABLET | ORAL | Status: DC | PRN
  Administered 2022-10-24: 1 via ORAL

## 2022-10-23 MED ORDER — DEXMEDETOMIDINE HCL IN NACL 80 MCG/20ML IV SOLN
INTRAVENOUS | Status: AC
  Filled 2022-10-23: qty 20

## 2022-10-23 MED ORDER — HEPARIN SODIUM (PORCINE) 1000 UNIT/ML IJ SOLN
INTRAMUSCULAR | Status: AC
  Filled 2022-10-23: qty 10

## 2022-10-23 MED ORDER — OXYCODONE-ACETAMINOPHEN 5-325 MG PO TABS
ORAL_TABLET | ORAL | Status: AC
  Filled 2022-10-23: qty 1

## 2022-10-23 MED ORDER — CHLORHEXIDINE GLUCONATE CLOTH 2 % EX PADS
6.0000 | MEDICATED_PAD | Freq: Once | CUTANEOUS | Status: AC
  Administered 2022-10-23: 6 via TOPICAL

## 2022-10-23 MED ORDER — ATORVASTATIN CALCIUM 20 MG PO TABS
40.0000 mg | ORAL_TABLET | Freq: Every day | ORAL | Status: DC
  Administered 2022-10-24: 40 mg via ORAL

## 2022-10-23 MED ORDER — SODIUM CHLORIDE 0.9 % IV SOLN: INTRAVENOUS | Status: DC | PRN

## 2022-10-23 MED ORDER — LISINOPRIL 5 MG PO TABS: 10.0000 mg | ORAL_TABLET | Freq: Every morning | ORAL | Status: DC

## 2022-10-23 MED ORDER — METOPROLOL TARTRATE 5 MG/5ML IV SOLN
INTRAVENOUS | Status: AC
  Filled 2022-10-23: qty 5

## 2022-10-23 MED ORDER — ALBUTEROL SULFATE (2.5 MG/3ML) 0.083% IN NEBU: 2.5000 mg | INHALATION_SOLUTION | Freq: Four times a day (QID) | RESPIRATORY_TRACT | Status: DC | PRN

## 2022-10-23 MED ORDER — CHLORHEXIDINE GLUCONATE 0.12 % MT SOLN
15.0000 mL | Freq: Once | OROMUCOSAL | Status: AC
  Administered 2022-10-23: 15 mL via OROMUCOSAL
  Filled 2022-10-23 (×2): qty 15

## 2022-10-23 MED ORDER — ONDANSETRON HCL 4 MG/2ML IJ SOLN
INTRAMUSCULAR | Status: DC | PRN
  Administered 2022-10-23: 4 mg via INTRAVENOUS

## 2022-10-23 MED ORDER — ONDANSETRON HCL 4 MG/2ML IJ SOLN
4.0000 mg | Freq: Four times a day (QID) | INTRAMUSCULAR | Status: DC | PRN
  Administered 2022-10-23: 4 mg via INTRAVENOUS

## 2022-10-23 MED ORDER — FENTANYL CITRATE (PF) 100 MCG/2ML IJ SOLN
INTRAMUSCULAR | Status: DC | PRN
  Administered 2022-10-23: 25 ug via INTRAVENOUS
  Administered 2022-10-23: 50 ug via INTRAVENOUS
  Administered 2022-10-23: 25 ug via INTRAVENOUS
  Administered 2022-10-23: 50 ug via INTRAVENOUS

## 2022-10-23 MED ORDER — PHENOL 1.4 % MT LIQD: 1.0000 | OROMUCOSAL | Status: DC | PRN

## 2022-10-23 MED ORDER — INSULIN ASPART 100 UNIT/ML IJ SOLN
0.0000 [IU] | Freq: Three times a day (TID) | INTRAMUSCULAR | Status: DC
  Administered 2022-10-24: 3 [IU] via SUBCUTANEOUS

## 2022-10-23 MED ORDER — CEFAZOLIN SODIUM-DEXTROSE 2-4 GM/100ML-% IV SOLN
2.0000 g | Freq: Three times a day (TID) | INTRAVENOUS | Status: AC
  Administered 2022-10-23: 2 g via INTRAVENOUS

## 2022-10-23 MED ORDER — FENTANYL CITRATE (PF) 100 MCG/2ML IJ SOLN
25.0000 ug | INTRAMUSCULAR | Status: DC | PRN
  Administered 2022-10-23: 50 ug via INTRAVENOUS
  Administered 2022-10-23: 25 ug via INTRAVENOUS

## 2022-10-23 MED ORDER — SODIUM CHLORIDE 0.9 % IV SOLN: INTRAVENOUS | Status: DC

## 2022-10-23 MED ORDER — SODIUM CHLORIDE 0.9 % IV SOLN: 500.0000 mL | Freq: Once | INTRAVENOUS | Status: DC | PRN

## 2022-10-23 MED ORDER — FENTANYL CITRATE (PF) 100 MCG/2ML IJ SOLN
INTRAMUSCULAR | Status: AC
  Filled 2022-10-23: qty 2

## 2022-10-23 MED ORDER — POTASSIUM CHLORIDE CRYS ER 20 MEQ PO TBCR: 20.0000 meq | EXTENDED_RELEASE_TABLET | Freq: Every day | ORAL | Status: DC | PRN

## 2022-10-23 MED ORDER — LABETALOL HCL 5 MG/ML IV SOLN: 10.0000 mg | INTRAVENOUS | Status: DC | PRN

## 2022-10-23 MED ORDER — PROPOFOL 10 MG/ML IV BOLUS
INTRAVENOUS | Status: AC
  Filled 2022-10-23: qty 20

## 2022-10-23 MED ORDER — DEXAMETHASONE SODIUM PHOSPHATE 10 MG/ML IJ SOLN
INTRAMUSCULAR | Status: DC | PRN
  Administered 2022-10-23: 10 mg via INTRAVENOUS

## 2022-10-23 MED ORDER — CEFAZOLIN SODIUM-DEXTROSE 2-4 GM/100ML-% IV SOLN
2.0000 g | INTRAVENOUS | Status: AC
  Administered 2022-10-23: 2 g via INTRAVENOUS

## 2022-10-23 MED ORDER — OXYCODONE HCL 5 MG/5ML PO SOLN: 5.0000 mg | Freq: Once | ORAL | Status: AC | PRN

## 2022-10-23 MED ORDER — ROCURONIUM BROMIDE 10 MG/ML (PF) SYRINGE
PREFILLED_SYRINGE | INTRAVENOUS | Status: AC
  Filled 2022-10-23: qty 10

## 2022-10-23 MED ORDER — CHLORHEXIDINE GLUCONATE CLOTH 2 % EX PADS: 6.0000 | MEDICATED_PAD | Freq: Once | CUTANEOUS | Status: DC

## 2022-10-23 MED ORDER — MAGNESIUM SULFATE 2 GM/50ML IV SOLN: 2.0000 g | Freq: Every day | INTRAVENOUS | Status: DC | PRN

## 2022-10-23 MED ORDER — NITROGLYCERIN IN D5W 200-5 MCG/ML-% IV SOLN: 5.0000 ug/min | INTRAVENOUS | Status: DC

## 2022-10-23 MED ORDER — HYDROMORPHONE HCL 1 MG/ML IJ SOLN: 1.0000 mg | Freq: Once | INTRAMUSCULAR | Status: DC | PRN

## 2022-10-23 MED ORDER — HYDRALAZINE HCL 20 MG/ML IJ SOLN: 5.0000 mg | INTRAMUSCULAR | Status: DC | PRN

## 2022-10-23 MED ORDER — DOCUSATE SODIUM 100 MG PO CAPS
100.0000 mg | ORAL_CAPSULE | Freq: Every day | ORAL | Status: DC
  Administered 2022-10-24: 100 mg via ORAL

## 2022-10-23 MED ORDER — ALUM & MAG HYDROXIDE-SIMETH 200-200-20 MG/5ML PO SUSP: 15.0000 mL | ORAL | Status: DC | PRN

## 2022-10-23 MED ORDER — ASPIRIN 325 MG PO TBEC: 325.0000 mg | DELAYED_RELEASE_TABLET | Freq: Every day | ORAL | Status: DC

## 2022-10-23 MED ORDER — HEPARIN SODIUM (PORCINE) 1000 UNIT/ML IJ SOLN
INTRAMUSCULAR | Status: DC | PRN
  Administered 2022-10-23: 8000 [IU] via INTRAVENOUS

## 2022-10-23 MED ORDER — LIDOCAINE HCL (CARDIAC) PF 100 MG/5ML IV SOSY
PREFILLED_SYRINGE | INTRAVENOUS | Status: DC | PRN
  Administered 2022-10-23: 100 mg via INTRAVENOUS

## 2022-10-23 MED ORDER — ROCURONIUM BROMIDE 100 MG/10ML IV SOLN
INTRAVENOUS | Status: DC | PRN
  Administered 2022-10-23: 10 mg via INTRAVENOUS
  Administered 2022-10-23: 60 mg via INTRAVENOUS
  Administered 2022-10-23: 10 mg via INTRAVENOUS

## 2022-10-23 MED ORDER — PHENYLEPHRINE HCL (PRESSORS) 10 MG/ML IV SOLN
INTRAVENOUS | Status: DC | PRN
  Administered 2022-10-23: 80 ug via INTRAVENOUS

## 2022-10-23 MED ORDER — DROPERIDOL 2.5 MG/ML IJ SOLN
0.6250 mg | Freq: Once | INTRAMUSCULAR | Status: AC
  Administered 2022-10-23: 0.625 mg via INTRAVENOUS

## 2022-10-23 MED ORDER — ONDANSETRON HCL 4 MG/2ML IJ SOLN: 4.0000 mg | Freq: Four times a day (QID) | INTRAMUSCULAR | Status: DC | PRN

## 2022-10-23 MED ORDER — CEFAZOLIN SODIUM-DEXTROSE 2-4 GM/100ML-% IV SOLN
INTRAVENOUS | Status: AC
  Administered 2022-10-23: 2 g via INTRAVENOUS
  Filled 2022-10-23: qty 100

## 2022-10-23 MED ORDER — DEXAMETHASONE SODIUM PHOSPHATE 10 MG/ML IJ SOLN
INTRAMUSCULAR | Status: AC
  Filled 2022-10-23: qty 1

## 2022-10-23 MED ORDER — ACETAMINOPHEN 325 MG PO TABS: 325.0000 mg | ORAL_TABLET | ORAL | Status: DC | PRN

## 2022-10-23 MED ORDER — MIDAZOLAM HCL 2 MG/2ML IJ SOLN
INTRAMUSCULAR | Status: AC
  Filled 2022-10-23: qty 2

## 2022-10-23 MED ORDER — METOPROLOL TARTRATE 5 MG/5ML IV SOLN
INTRAVENOUS | Status: DC | PRN
  Administered 2022-10-23: 2 mg via INTRAVENOUS

## 2022-10-23 MED ORDER — PHENYLEPHRINE HCL-NACL 20-0.9 MG/250ML-% IV SOLN
INTRAVENOUS | Status: DC | PRN
  Administered 2022-10-23: 40 ug/min via INTRAVENOUS

## 2022-10-23 MED ORDER — MIDAZOLAM HCL 2 MG/2ML IJ SOLN
INTRAMUSCULAR | Status: DC | PRN
  Administered 2022-10-23: 2 mg via INTRAVENOUS

## 2022-10-23 MED ORDER — PROPOFOL 10 MG/ML IV BOLUS
INTRAVENOUS | Status: DC | PRN
  Administered 2022-10-23: 50 mg via INTRAVENOUS
  Administered 2022-10-23: 110 mg via INTRAVENOUS

## 2022-10-23 MED ORDER — PHENYLEPHRINE HCL-NACL 20-0.9 MG/250ML-% IV SOLN
INTRAVENOUS | Status: AC
  Filled 2022-10-23: qty 250

## 2022-10-23 MED ORDER — OXYCODONE HCL 5 MG PO TABS
5.0000 mg | ORAL_TABLET | Freq: Once | ORAL | Status: AC | PRN
  Administered 2022-10-23: 5 mg via ORAL

## 2022-10-23 MED ORDER — DROPERIDOL 2.5 MG/ML IJ SOLN
INTRAMUSCULAR | Status: AC
  Filled 2022-10-23: qty 2

## 2022-10-23 MED ORDER — CEFAZOLIN SODIUM-DEXTROSE 2-4 GM/100ML-% IV SOLN
INTRAVENOUS | Status: AC
  Filled 2022-10-23: qty 100

## 2022-10-23 MED ORDER — ORAL CARE MOUTH RINSE: 15.0000 mL | Freq: Once | OROMUCOSAL | Status: AC

## 2022-10-23 MED ORDER — SUGAMMADEX SODIUM 200 MG/2ML IV SOLN
INTRAVENOUS | Status: DC | PRN
  Administered 2022-10-23: 200 mg via INTRAVENOUS

## 2022-10-23 MED ORDER — IODIXANOL 320 MG/ML IV SOLN
INTRAVENOUS | Status: DC | PRN
  Administered 2022-10-23: 65 mL

## 2022-10-23 MED ORDER — PANTOPRAZOLE SODIUM 40 MG IV SOLR
INTRAVENOUS | Status: AC
  Filled 2022-10-23: qty 10

## 2022-10-23 MED ORDER — GLYCOPYRROLATE 0.2 MG/ML IJ SOLN
INTRAMUSCULAR | Status: AC
  Filled 2022-10-23: qty 1

## 2022-10-23 MED ORDER — GUAIFENESIN-DM 100-10 MG/5ML PO SYRP: 15.0000 mL | ORAL_SOLUTION | ORAL | Status: DC | PRN

## 2022-10-23 SURGICAL SUPPLY — 28 items
BALLN DORADO 10X80X80 (BALLOONS) ×1
BALLOON DORADO 10X80X80 (BALLOONS) IMPLANT
CATH ACCU-VU SIZ PIG 5F 70CM (CATHETERS) IMPLANT
CATH ANGIO 5F PIGTAIL 65CM (CATHETERS) IMPLANT
CATH BALLN CODA 9X100X32 (BALLOONS) IMPLANT
CATH KUMPE SOFT-VU 5FR 65 (CATHETERS) IMPLANT
CLOSURE PERCLOSE PROSTYLE (VASCULAR PRODUCTS) IMPLANT
COVER DRAPE FLUORO 36X44 (DRAPES) IMPLANT
COVER PROBE ULTRASOUND 5X96 (MISCELLANEOUS) IMPLANT
DEVICE SAFEGUARD 24CM (GAUZE/BANDAGES/DRESSINGS) IMPLANT
EXCLUDER TNK LEG 26MX14X14 (Endovascular Graft) IMPLANT
EXCLUDER TRUNK LEG 26MX14X14 (Endovascular Graft) ×1 IMPLANT
GLIDEWIRE STIFF .35X180X3 HYDR (WIRE) IMPLANT
KIT ENCORE 26 ADVANTAGE (KITS) IMPLANT
LEG CONTRALATERAL 16X12X12 (Vascular Products) IMPLANT
NDL ENTRY 21GA 7CM ECHOTIP (NEEDLE) IMPLANT
NEEDLE ENTRY 21GA 7CM ECHOTIP (NEEDLE) ×1 IMPLANT
PACK ANGIOGRAPHY (CUSTOM PROCEDURE TRAY) ×1 IMPLANT
SET INTRO CAPELLA COAXIAL (SET/KITS/TRAYS/PACK) IMPLANT
SHEATH BRITE TIP 6FRX11 (SHEATH) IMPLANT
SHEATH BRITE TIP 8FRX11 (SHEATH) IMPLANT
SHEATH DRYSEAL FLEX 12FR 33CM (SHEATH) IMPLANT
SHEATH DRYSEAL FLEX 16FR 33CM (SHEATH) IMPLANT
SPONGE XRAY 4X4 16PLY STRL (MISCELLANEOUS) IMPLANT
SYR MEDRAD MARK 7 150ML (SYRINGE) IMPLANT
TUBING CONTRAST HIGH PRESS 72 (TUBING) IMPLANT
WIRE AMPLATZ SSTIFF .035X260CM (WIRE) IMPLANT
WIRE GUIDERIGHT .035X150 (WIRE) IMPLANT

## 2022-10-23 NOTE — Anesthesia Preprocedure Evaluation (Signed)
Anesthesia Evaluation  Patient identified by MRN, date of birth, ID band Patient awake    Reviewed: Allergy & Precautions, NPO status , Patient's Chart, lab work & pertinent test results  History of Anesthesia Complications Negative for: history of anesthetic complications  Airway Mallampati: III  TM Distance: <3 FB Neck ROM: full    Dental   Pulmonary shortness of breath and with exertion, COPD, Current Smoker and Patient abstained from smoking.   Pulmonary exam normal        Cardiovascular Exercise Tolerance: Good hypertension, + CAD and + Peripheral Vascular Disease  (-) Past MI Normal cardiovascular exam     Neuro/Psych negative neurological ROS  negative psych ROS   GI/Hepatic Neg liver ROS,GERD  Controlled,,  Endo/Other  negative endocrine ROSdiabetes    Renal/GU Renal disease     Musculoskeletal   Abdominal   Peds  Hematology negative hematology ROS (+)   Anesthesia Other Findings Past Medical History: No date: Aortic atherosclerosis No date: Arthritis 12/01/2013: Benign fibroma of prostate No date: BPH (benign prostatic hyperplasia) No date: CAD (coronary artery disease)     Comment:  a.) PCI in ~ 2004 in [place], [place] -> location and               stent type unknown No date: Carotid artery occlusion 03/25/2014: COPD (chronic obstructive pulmonary disease) [id number]: Diastolic dysfunction     Comment:  a.) TTE 11/02/2020: EF 60-65%, G1DD. No date: Diverticulosis No date: Erectile dysfunction     Comment:  a.) on PDE5i (sildenafil) 12/01/2013: GERD (gastroesophageal reflux disease) No date: Hepatic steatosis 03/14/2015: History of kidney stones 12/01/2013: HLD (hyperlipidemia) No date: Hypertension No date: Iliac artery stenosis, bilateral     Comment:  a.) CTA AP 09/23/2022: severe stenosis of the BILATERAL               ostial CIAs. No date: Infrarenal abdominal aortic aneurysm (AAA) without  rupture     Comment:  a.) CT renal 01/28/2015: 3.8 cm; b.) lumbar MRI               04/11/2016: 3.7 cm; c.) CT renal 12/24/2017: 3.7 x 3.8               cm; d.) CT AP 08/22/2022: 4.9 cm; e.) CTA AP 09/23/2022:               4.6 cm No date: T2DM (type 2 diabetes mellitus)  Past Surgical History: No date: APPENDECTOMY No date: CARDIAC CATHETERIZATION 2004?: CORONARY ANGIOPLASTY     Comment:  [place], [employer] 01/28/2015: CYSTOSCOPY W/ URETERAL STENT PLACEMENT; Left     Comment:  Procedure: CYSTOSCOPY WITH RETROGRADE PYELOGRAM/URETERAL              STENT PLACEMENT;  Surgeon: Alexis Frock, MD;  Location:              [facility] ORS;  Service: Urology;  Laterality: Left; 02/08/2015: CYSTOSCOPY/URETEROSCOPY/HOLMIUM LASER/STENT PLACEMENT; Left     Comment:  Procedure: CYSTOSCOPY/URETEROSCOPY/HOLMIUM LASER/STENT               PLACEMENT;  Surgeon: Hollice Espy, MD;  Location: [facility]               ORS;  Service: Urology;  Laterality: Left; 01/07/2018: CYSTOSCOPY/URETEROSCOPY/HOLMIUM LASER/STENT PLACEMENT; Left     Comment:  Procedure: CYSTOSCOPY/URETEROSCOPY/HOLMIUM LASER/STENT               PLACEMENT;  Surgeon: Hollice Espy, MD;  Location: [facility]  ORS;  Service: Urology;  Laterality: Left; 09/09/2022: CYSTOSCOPY/URETEROSCOPY/HOLMIUM LASER/STENT PLACEMENT;  Bilateral     Comment:  Procedure: CYSTOSCOPY/URETEROSCOPY/HOLMIUM LASER/STENT               PLACEMENT/RETROGRADE;  Surgeon: Hollice Espy, MD;                Location: [facility] ORS;  Service: Urology;  Laterality:               Bilateral; No date: FACIAL COSMETIC SURGERY     Comment:  21 fractures in left cheek and face from baseball No date: LITHOTRIPSY No date: VASECTOMY  BMI    Body Mass Index: 34.41 kg/m      Reproductive/Obstetrics negative OB ROS                             Anesthesia Physical Anesthesia Plan  ASA: 3  Anesthesia Plan: General ETT   Post-op Pain Management:    Induction:  Intravenous  PONV Risk Score and Plan: Ondansetron, Dexamethasone, Midazolam and Treatment may vary due to age or medical condition  Airway Management Planned: Oral ETT  Additional Equipment:   Intra-op Plan:   Post-operative Plan: Extubation in OR  Informed Consent: I have reviewed the patients History and Physical, chart, labs and discussed the procedure including the risks, benefits and alternatives for the proposed anesthesia with the patient or authorized representative who has indicated his/her understanding and acceptance.     Dental Advisory Given  Plan Discussed with: Anesthesiologist, CRNA and Surgeon  Anesthesia Plan Comments: (Patient consented for risks of anesthesia including but not limited to:  - adverse reactions to medications - damage to eyes, teeth, lips or other oral mucosa - nerve damage due to positioning  - sore throat or hoarseness - Damage to heart, brain, nerves, lungs, other parts of body or loss of life  Patient voiced understanding.)       Anesthesia Quick Evaluation

## 2022-10-23 NOTE — Anesthesia Procedure Notes (Signed)
Procedure Name: Intubation Date/Time: 10/23/2022 8:47 AM  Performed by: Cammie Sickle, CRNAPre-anesthesia Checklist: Patient identified, Patient being monitored, Timeout performed, Emergency Drugs available and Suction available Patient Re-evaluated:Patient Re-evaluated prior to induction Oxygen Delivery Method: Circle system utilized Preoxygenation: Pre-oxygenation with 100% oxygen Induction Type: IV induction Ventilation: Mask ventilation without difficulty Laryngoscope Size: 3 and McGraph Grade View: Grade I Tube type: Oral Tube size: 7.0 mm Number of attempts: 1 Airway Equipment and Method: Stylet Placement Confirmation: ETT inserted through vocal cords under direct vision, positive ETCO2 and breath sounds checked- equal and bilateral Secured at: 22 cm Tube secured with: Tape Dental Injury: Teeth and Oropharynx as per pre-operative assessment  Comments: Atraumatic smooth intubation, no complications noted.

## 2022-10-23 NOTE — Interval H&P Note (Signed)
History and Physical Interval Note:  10/23/2022 8:04 AM  [name]  has presented today for surgery, with the diagnosis of AAA    GORE   AAA repair [name] to assist.  The various methods of treatment have been discussed with the patient and family. After consideration of risks, benefits and other options for treatment, the patient has consented to  Procedure(s): ENDOVASCULAR REPAIR/STENT GRAFT (N/A) as a surgical intervention.  The patient's history has been reviewed, patient examined, no change in status, stable for surgery.  I have reviewed the patient's chart and labs.  Questions were answered to the patient's satisfaction.     Hortencia Pilar

## 2022-10-23 NOTE — Op Note (Signed)
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  AAA 2.  Left femoral artery was diseased and required cutdown for repair of the arteriotomy from the sheath.  SPECIMEN(S):  none  INDICATIONS:   [name] is a 62 y.o. male who presents with greater than 5 cm abdominal aortic aneurysm putting him at risk of full rupture. The anatomy was suitable for endovascular repair.  Risks and benefits of repair in an endovascular fashion were discussed and informed consent was obtained. Co-surgeons are used to expedite the procedure and reduce operative time as bilateral work needs to be done.  DESCRIPTION: After obtaining full informed written consent, the patient was brought back to the operating room and placed supine upon the operating table.  The patient received IV antibiotics prior to induction.  After obtaining adequate anesthesia, the patient was prepped and draped in the standard fashion for endovascular AAA repair.  We then began by gaining access to both femoral arteries with US guidance with me working on the left and Dr. Delana Meyer working on the right.  The femoral arteries were found to be patent although calcified and somewhat diseased and accessed without difficulty with a needle under ultrasound guidance without difficulty on each side and permanent images were recorded.  We then placed 2 proglide devices  on each side in a pre-close fashion and placed 8 French sheaths. The patient was then given thousand units of intravenous heparin. The Pigtail catheter was placed into the aorta from the left side. Using this image, we selected a 26 mm diameter by 14 cm length Main body device.  Over a stiff wire, an 16 French sheath was placed up the right side. The main body was then placed through the 16 French sheath. A Kumpe catheter was placed up the left side and a magnified image at the renal arteries was performed. The main body was then deployed just below the lowest renal artery which was the right renal artery. The Kumpe catheter was used to cannulate the contralateral gate without difficulty and successful cannulation was confirmed by twirling the pigtail catheter in the main body. We then placed a stiff wire and a retrograde arteriogram was performed through the left femoral sheath. We upsized to the 12 [place] sheath up the left side for the contralateral limb and a 12 mm diameter by 12 cm length left iliac limb was selected and deployed. The main body deployment was then completed. Based off the angiographic findings, extension limbs were not necessary. All junction points and seals zones were treated with the compliant balloon.  A 10 mm diameter noncompliant balloon was used in the left iliac artery as well.  The pigtail catheter was then replaced and a completion angiogram was performed.  No apparent endoleak was detected on completion angiography. The renal arteries were found to be widely patent.  Both hypogastric arteries were found to be patent. At this point we elected to terminate the procedure.  We secured the pro glide devices for hemostasis on the femoral arteries, but on the left side, the Pro-glide devices would not gain hemostasis.  They did on the right side and this was closed and we elected to perform a cutdown on the left side to repair the arteriotomy. The skin incision was closed with a 4-0 Monocryl  on the right. Dermabond and pressure dressing were placed.  A small vertical incision was created proximal and distal to the sheath and we dissected down to the left femoral artery.  This was tedious due to the depth as well as having the sheath in place and the diseased artery.  The common femoral artery was dissected out proximally and distally and control was created with vascular clamps with the sheath and wire removed.  The existing Pro-glide devices had not brought apposition of the arterial wall and these were cut out.  A total of five 5-0 Prolene sutures were used to close the arteriotomy in an interrupted fashion.  This resulted in hemostasis.  Surgicel was placed.  2 layers of 2-0 Vicryl, 2 layers of 3-0 Vicryl, and a 4-0 Monocryl used to close the left femoral incision.  Dermabond was placed as a dressing.  The patient was taken to the recovery room in stable condition having tolerated the procedure well.  COMPLICATIONS: none  CONDITION: stable  Leotis Pain  10/23/2022, 11:08 AM   This note was created with Dragon Medical transcription system. Any errors in dictation are purely unintentional.

## 2022-10-23 NOTE — Op Note (Signed)
OPERATIVE NOTE   PROCEDURE: US guidance for vascular access, bilateral femoral arteries Catheter placement into aorta from bilateral femoral approaches Placement of a C3 26 x 14 x 14 Gore Excluder Endoprosthesis main body with a 12 x 12 contralateral iliac limb ProGlide closure devices bilateral femoral arteries  PRE-OPERATIVE DIAGNOSIS: AAA; atherosclerotic occlusive disease bilateral lower extremities with lifestyle limiting claudication left leg greater than right  POST-OPERATIVE DIAGNOSIS: same  SURGEON: Hortencia Pilar, MD and Leotis Pain, MD - Co-surgeons  ANESTHESIA: general  ESTIMATED BLOOD LOSS: 100 cc  FINDING(S): 1.  AAA associated with greater than 90% stenosis of the left common iliac  SPECIMEN(S):  none  INDICATIONS:   [name] is a 62 y.o. y.o. male who presents with severe claudication symptoms.  Workup identified an abdominal aortic aneurysm which is now approximately 5 cm.  He is an endovascular candidate and therefore stent graft placement was recommended to prevent lethal rupture.  Risks and benefits were reviewed all questions were answered alternative therapies were also discussed.  Patient has agreed to proceed  DESCRIPTION: After obtaining full informed written consent, the patient was brought back to the operating room and placed supine upon the operating table.  The patient received IV antibiotics prior to induction.  After obtaining adequate anesthesia, the patient was prepped and draped in the standard fashion for endovascular AAA repair.  Co-surgeons are required because this is a complex bilateral procedure with work being performed simultaneously from both the right femoral and left femoral approach.  This also expedites the procedure making a shorter operative time reducing complications and improving patient safety.  We then began by gaining access to both femoral arteries with US guidance with me working on the patient's right and Dr. Lucky Cowboy  working on the left of the patient.  The femoral arteries were found to be patent and accessed without difficulty with a needle under ultrasound guidance without difficulty on each side and permanent images were recorded.  We then placed 2 proglide devices on each side in a pre-close fashion and placed 8 French sheaths.  The patient was then given 8000 units of intravenous heparin.   The Pigtail catheter was placed into the aorta from the left side. Using this image, we selected a C3 26 x 14 x 14 Main body device.  Over a stiff wire, an 16 French sheath was placed. The main body was then placed through the 16 French sheath. A Kumpe catheter was placed up the left side and a magnified image at the renal arteries was performed. The main body was then deployed just below the lowest renal artery. The Kumpe catheter was used to cannulate the contralateral gate without difficulty and successful cannulation was confirmed by twirling the pigtail catheter in the main body. We then placed a stiff wire through the pigtail catheter and a retrograde arteriogram was performed through the left femoral sheath. We upsized to the 12 [place] sheath for the contralateral limb and a 12 x 12 iliac limb was selected and deployed. The main body deployment was then completed. Based off the angiographic findings, extension limbs were not necessary.  Given the high-grade stenosis of the left common iliac a 10 mm x 80 mm [place] balloon was advanced up the left size and inflation was performed to 6 atm. All junction points and seals zones were then treated with the compliant balloon.   The pigtail catheter was then replaced and a completion angiogram was performed.   No endoleak was  detected on completion angiography. The renal arteries were found to be widely patent.  There is less than 10% residual stenosis noted in the common iliac arteries demonstrating successful treatment of the occlusive disease.   At this point we elected to  terminate the procedure. We secured the pro glide devices for hemostasis on the femoral arteries on the right.  On the left side we could not achieve hemostasis using the Perclose devices and spite several additional attempts and elected to perform a cutdown.  The surgical team then came to the table.  An 8 French sheath was then advanced over the J-wire to achieve hemostasis while we moved forward with a cutdown.  A vertical incision was then made in the right groin and the dissection carried down through the soft tissues using both Metzenbaum scissors and Bovie cautery exposing the artery.  We were able to get proximal and distal control using an angled Fogarty clamp proximally and a profunda clamp distally.  Once we did dissected the anterior surface of the artery the Perclose sutures were then removed and the arteriotomy closed using 4 interrupted 5-0 Prolene sutures.  Flushing maneuvers were performed and the sutures were secured.  The artery was inspected for hemostasis.  By palpation there was an excellent pulse noted distally from the suture line.  The wound was then irrigated with approximately 100 cc of saline and Surgicel was placed in the bed of the wound.  The wound was then closed in layers using 2-0 Vicryl followed by 3-0 Vicryl followed by 4-0 Monocryl subcuticular.  Dermabond was applied to the  left incision.  The right skin incision was closed with a 4-0 Monocryl. Dermabond and pressure dressing were placed. The patient was taken to the recovery room in stable condition having tolerated the procedure well.  COMPLICATIONS: none  CONDITION: stable  Hortencia Pilar  10/23/2022, 11:06 AM

## 2022-10-23 NOTE — Anesthesia Postprocedure Evaluation (Signed)
Anesthesia Post Note  Patient: [name]  Procedure(s) Performed: ENDOVASCULAR REPAIR/STENT GRAFT  Patient location during evaluation: PACU Anesthesia Type: General Level of consciousness: awake and alert Pain management: pain level controlled Vital Signs Assessment: post-procedure vital signs reviewed and stable Respiratory status: spontaneous breathing, nonlabored ventilation, respiratory function stable and patient connected to nasal cannula oxygen Cardiovascular status: blood pressure returned to baseline and stable Postop Assessment: no apparent nausea or vomiting Anesthetic complications: no   No notable events documented.   Last Vitals:  Vitals:   10/23/22 1410 10/23/22 1430  BP:    Pulse: (!) 104 (!) 103  Resp: 18 18  Temp:    SpO2: 95% 95%    Last Pain:  Vitals:   10/23/22 1430  TempSrc:   PainSc: 0-No pain                 Precious Haws Britteny Fiebelkorn

## 2022-10-23 NOTE — Transfer of Care (Signed)
Immediate Anesthesia Transfer of Care Note  Patient: [name]  Procedure(s) Performed: ENDOVASCULAR REPAIR/STENT GRAFT  Patient Location: PACU  Anesthesia Type:General  Level of Consciousness: awake  Airway & Oxygen Therapy: Patient Spontanous Breathing and Patient connected to face mask oxygen  Post-op Assessment: Report given to RN and Post -op Vital signs reviewed and stable  Post vital signs: Reviewed and stable  Last Vitals:  Vitals Value Taken Time  BP 121/74 10/23/22 1118  Temp 35.9 1118  Pulse 88 10/23/22 1123  Resp 21 10/23/22 1123  SpO2 96 % 10/23/22 1123  Vitals shown include unvalidated device data.  Last Pain:  Vitals:   10/23/22 0731  TempSrc: Oral         Complications: No notable events documented.

## 2022-10-23 NOTE — OR Nursing (Signed)
Belongings given to daughter  

## 2022-10-24 ENCOUNTER — Encounter (INDEPENDENT_AMBULATORY_CARE_PROVIDER_SITE_OTHER): Payer: Self-pay | Admitting: Vascular Surgery

## 2022-10-24 LAB — GLUCOSE, CAPILLARY
Glucose-Capillary: 261 mg/dL — ABNORMAL HIGH (ref 70–99)
Glucose-Capillary: 151 mg/dL — ABNORMAL HIGH (ref 70–99)

## 2022-10-24 LAB — CBC
HCT: 35.9 % — ABNORMAL LOW (ref 39.0–52.0)
Hemoglobin: 12.1 g/dL — ABNORMAL LOW (ref 13.0–17.0)
MCH: 31.5 pg (ref 26.0–34.0)
MCHC: 33.7 g/dL (ref 30.0–36.0)
MCV: 93.5 fL (ref 80.0–100.0)
Platelets: 234 10*3/uL (ref 150–400)
RBC: 3.84 MIL/uL — ABNORMAL LOW (ref 4.22–5.81)
RDW: 13.7 % (ref 11.5–15.5)
WBC: 13.1 10*3/uL — ABNORMAL HIGH (ref 4.0–10.5)
nRBC: 0 % (ref 0.0–0.2)

## 2022-10-24 LAB — BASIC METABOLIC PANEL
Anion gap: 6 (ref 5–15)
BUN: 13 mg/dL (ref 6–20)
CO2: 22 mmol/L (ref 22–32)
Calcium: 8.1 mg/dL — ABNORMAL LOW (ref 8.9–10.3)
Chloride: 108 mmol/L (ref 98–111)
Creatinine, Ser: 0.86 mg/dL (ref 0.61–1.24)
GFR, Estimated: >60 mL/min (ref >60)
Glucose, Bld: 209 mg/dL — ABNORMAL HIGH (ref 70–99)
Potassium: 3.7 mmol/L (ref 3.5–5.1)
Sodium: 136 mmol/L (ref 135–145)

## 2022-10-24 LAB — HEMOGLOBIN A1C
Hgb A1c MFr Bld: 6.8 % — ABNORMAL HIGH (ref 4.8–5.6)
Mean Plasma Glucose: 148 mg/dL

## 2022-10-24 MED ORDER — DOCUSATE SODIUM 100 MG PO CAPS
ORAL_CAPSULE | ORAL | Status: AC
  Filled 2022-10-24: qty 1

## 2022-10-24 MED ORDER — ORAL CARE MOUTH RINSE: 15.0000 mL | OROMUCOSAL | Status: DC | PRN

## 2022-10-24 MED ORDER — ASPIRIN 325 MG PO TBEC
DELAYED_RELEASE_TABLET | ORAL | Status: AC
  Filled 2022-10-24: qty 1

## 2022-10-24 MED ORDER — INSULIN ASPART 100 UNIT/ML IJ SOLN
INTRAMUSCULAR | Status: AC
  Filled 2022-10-24: qty 1

## 2022-10-24 MED ORDER — LISINOPRIL 5 MG PO TABS
ORAL_TABLET | ORAL | Status: AC
  Filled 2022-10-24: qty 2

## 2022-10-24 MED ORDER — VANCOMYCIN HCL IN DEXTROSE 1-5 GM/200ML-% IV SOLN
INTRAVENOUS | Status: AC
  Filled 2022-10-24: qty 200

## 2022-10-24 MED ORDER — CLOPIDOGREL BISULFATE 75 MG PO TABS: 75.0000 mg | ORAL_TABLET | Freq: Every day | ORAL | 6 refills | Status: AC

## 2022-10-24 MED ORDER — ASPIRIN 81 MG PO TBEC: 81.0000 mg | DELAYED_RELEASE_TABLET | Freq: Every day | ORAL | 2 refills | Status: AC

## 2022-10-24 MED ORDER — ATORVASTATIN CALCIUM 20 MG PO TABS
ORAL_TABLET | ORAL | Status: AC
  Filled 2022-10-24: qty 2

## 2022-10-24 MED ORDER — OXYCODONE-ACETAMINOPHEN 5-325 MG PO TABS: 1.0000 | ORAL_TABLET | ORAL | 0 refills | Status: AC | PRN

## 2022-10-24 NOTE — TOC Initial Note (Signed)
Transition of Care [facility]) - Initial/Assessment Note    Patient Details  Name: [name] MRN: [id number] Date of Birth: 06/02/1961  Transition of [facility]) CM/SW Contact:    Beverly Sessions, RN Phone Number: 10/24/2022, 10:35 AM  Clinical Narrative:                   Transition of Care [facility]) Screening Note   Patient Details  Name: [name] Date of Birth: June 10, 1961   Transition of Care [facility] CM/SW Contact:    Beverly Sessions, RN Phone Number: 10/24/2022, 10:36 AM    Transition of Care Department [facility]) has reviewed patient and no TOC needs have been identified at this time. We will continue to monitor patient advancement through interdisciplinary progression rounds. If new patient transition needs arise, please place a TOC consult.         Patient Goals and CMS Choice            Expected Discharge Plan and Services         Expected Discharge Date: 10/24/22                                    Prior Living Arrangements/Services                       Activities of Daily Living Home Assistive Devices/Equipment: None ADL Screening (condition at time of admission) Patient's cognitive ability adequate to safely complete daily activities?: Yes Is the patient deaf or have difficulty hearing?: No Does the patient have difficulty seeing, even when wearing glasses/contacts?: No Does the patient have difficulty concentrating, remembering, or making decisions?: No Patient able to express need for assistance with ADLs?: Yes Does the patient have difficulty dressing or bathing?: No Independently performs ADLs?: Yes (appropriate for developmental age) Does the patient have difficulty walking or climbing stairs?: Yes Weakness of Legs: Both Weakness of Arms/Hands: None  Permission Sought/Granted                  Emotional Assessment              Admission diagnosis:  Abdominal aortic aneurysm (AAA) [I71.40] Patient Active  Problem List   Diagnosis Date Noted   Abdominal aortic aneurysm (AAA) 10/23/2022   CAD (coronary artery disease) 09/26/2022   Atherosclerosis of native arteries of extremity with intermittent claudication 09/24/2022   Chest pain 11/01/2020   Type 2 diabetes mellitus 11/01/2020   Hyperlipidemia associated with type 2 diabetes mellitus 11/01/2020   AAA (abdominal aortic aneurysm) without rupture 05/13/2016   PVD (peripheral vascular disease) 04/10/2016   Hypertension associated with diabetes 03/14/2015   Calculus of kidney 03/14/2015   COPD, mild 03/25/2014   Benign fibroma of prostate 12/01/2013   Acid reflux 12/01/2013   HLD (hyperlipidemia) 12/01/2013   PCP:  Sofie Hartigan, MD Pharmacy:   [id number] - [place], [place] [address] Phone: [phone] Fax: [phone]     Social Determinants of Health (SDOH) Social History: SDOH Screenings   Food Insecurity: No Food Insecurity (10/23/2022)  Housing: Low Risk  (10/23/2022)  Transportation Needs: No Transportation Needs (10/23/2022)  Utilities: Not At Risk (10/23/2022)  Tobacco Use: High Risk (10/23/2022)   SDOH Interventions: Food Insecurity Interventions: Intervention Not Indicated Housing Interventions: Intervention Not Indicated Transportation Interventions: Intervention Not Indicated Utilities Interventions: Intervention  Not Indicated   Readmission Risk Interventions     No data to display

## 2022-10-24 NOTE — Discharge Summary (Signed)
[facility]    Discharge Summary    Patient ID:  [name] MRN: [id number] DOB/AGE: [birth year] 62 y.o.  Admit date: 10/23/2022 Discharge date: 10/24/2022 Date of Surgery: 10/23/2022 Surgeon: Surgeon(s): Schnier, Dolores Lory, MD Algernon Huxley, MD  Admission Diagnosis: Abdominal aortic aneurysm (AAA) [I71.40]  Discharge Diagnoses:  Abdominal aortic aneurysm (AAA) [I71.40]  Secondary Diagnoses: Past Medical History:  Diagnosis Date   Aortic atherosclerosis    Arthritis    Benign fibroma of prostate 12/01/2013   BPH (benign prostatic hyperplasia)    CAD (coronary artery disease)    a.) PCI in ~ 2004 in [place], [place] -> location and stent type unknown   Carotid artery occlusion    COPD (chronic obstructive pulmonary disease) [id number]   Diastolic dysfunction [id number]   a.) TTE 11/02/2020: EF 60-65%, G1DD.   Diverticulosis    Erectile dysfunction    a.) on PDE5i (sildenafil)   GERD (gastroesophageal reflux disease) 12/01/2013   Hepatic steatosis    History of kidney stones 03/14/2015   HLD (hyperlipidemia) 12/01/2013   Hypertension    Iliac artery stenosis, bilateral    a.) CTA AP 09/23/2022: severe stenosis of the BILATERAL ostial CIAs.   Infrarenal abdominal aortic aneurysm (AAA) without rupture    a.) CT renal 01/28/2015: 3.8 cm; b.) lumbar MRI 04/11/2016: 3.7 cm; c.) CT renal 12/24/2017: 3.7 x 3.8 cm; d.) CT AP 08/22/2022: 4.9 cm; e.) CTA AP 09/23/2022: 4.6 cm   T2DM (type 2 diabetes mellitus)     Procedure(s): ENDOVASCULAR REPAIR/STENT GRAFT  Discharged Condition: good  HPI:  [name] is a 62 year old male who underwent AAA repair for atherosclerotic occlusive disease bilateral to his lower extremities.  He had a Gore excluder endoprosthesis placed into his aorta.  This morning he is resting comfortably.  Recovering as expected.  Patient denies any pains this morning.  Patient denies any chest pain or dizziness.  Patient  denies any lower extremity pain.  Bilateral groin incisions are clean dry and intact.  Hematoma seroma to note.  Patient will be placed on 81 mg aspirin and 75 mg of Plavix going home.  Is all remained stable.  No complaints overnight.  Hospital Course:  [name] is a 62 y.o. male is S/P Bilateral Extubated: POD # 0 Physical Exam:  Alert notes x3, no acute distress Face: Symmetrical.  Tongue is midline. Neck: Trachea is midline.  No swelling or bruising. Cardiovascular: Regular rate and rhythm Pulmonary: Clear to auscultation bilaterally Abdomen: Soft, nontender, nondistended Right groin access: Clean dry and intact.  No swelling or drainage noted Left groin access: Clean dry and intact.  No swelling or drainage noted Left lower extremity: Thigh soft.  Calf soft.  Extremities warm distally toes.  Hard to palpate pedal pulses however the foot is warm is her good capillary refill. Right lower extremity: Thigh soft.  Calf soft.  Extremities warm distally toes.  Hard to palpate pedal pulses however the foot is warm is her good capillary refill. Neurological: No deficits noted   Post-op wounds:  clean, dry, intact or healing well  Pt. Ambulating, voiding and taking PO diet without difficulty. Pt pain controlled with PO pain meds.  Labs:  As below  Complications: none  Consults:    Significant Diagnostic Studies: CBC Lab Results  Component Value Date   WBC 13.1 (H) 10/24/2022   HGB 12.1 (L) 10/24/2022   HCT 35.9 (L) 10/24/2022   MCV 93.5 10/24/2022   PLT  234 10/24/2022    BMET    Component Value Date/Time   NA 136 10/24/2022 0630   NA 135 (L) 09/25/2012 1155   K 3.7 10/24/2022 0630   K 3.7 09/25/2012 1155   CL 108 10/24/2022 0630   CL 102 09/25/2012 1155   CO2 22 10/24/2022 0630   CO2 26 09/25/2012 1155   GLUCOSE 209 (H) 10/24/2022 0630   GLUCOSE 95 09/25/2012 1155   BUN 13 10/24/2022 0630   BUN 16 09/25/2012 1155   CREATININE 0.86 10/24/2022 0630    CREATININE 0.90 09/25/2012 1155   CALCIUM 8.1 (L) 10/24/2022 0630   CALCIUM 8.8 09/25/2012 1155   GFRNONAA >60 10/24/2022 0630   GFRNONAA >60 09/25/2012 1155   GFRAA >60 05/04/2019 2004   GFRAA >60 09/25/2012 1155   COAG Lab Results  Component Value Date   INR 1.1 10/23/2022   INR 1.0 11/01/2020     Disposition:  Discharge to :Home  Allergies as of 10/24/2022   No Known Allergies      Medication List     TAKE these medications    albuterol 108 (90 Base) MCG/ACT inhaler Commonly known as: VENTOLIN HFA Inhale 2 puffs into the lungs every 6 (six) hours as needed for wheezing or shortness of breath.   aspirin EC 81 MG tablet Take 1 tablet (81 mg total) by mouth daily. What changed:  medication strength how much to take   atorvastatin 40 MG tablet Commonly known as: LIPITOR Take 40 mg by mouth daily.   clopidogrel 75 MG tablet Commonly known as: Plavix Take 1 tablet (75 mg total) by mouth daily.   esomeprazole 20 MG capsule Commonly known as: NEXIUM Take 20 mg by mouth every morning.   fenofibrate 145 MG tablet Commonly known as: TRICOR Take 145 mg by mouth every morning.   lisinopril 10 MG tablet Commonly known as: ZESTRIL Take 10 mg by mouth every morning.   meloxicam 15 MG tablet Commonly known as: MOBIC Take 15 mg by mouth daily.   metFORMIN 500 MG tablet Commonly known as: GLUCOPHAGE Take 500 mg by mouth 2 (two) times daily with a meal.   Multi-Vitamins Tabs Take 1 tablet by mouth every morning.   nicotine 7 mg/24hr patch Commonly known as: NICODERM CQ - dosed in mg/24 hr Place 7 mg onto the skin daily.   oxyCODONE-acetaminophen 5-325 MG tablet Commonly known as: PERCOCET/ROXICET Take 1-2 tablets by mouth every 4 (four) hours as needed for moderate pain.   pioglitazone 15 MG tablet Commonly known as: ACTOS Take 15 mg by mouth daily.   sildenafil 20 MG tablet Commonly known as: REVATIO Take 20-60 mg by mouth daily as needed.        Verbal and written Discharge instructions given to the patient. Wound care per Discharge AVS  Follow-up Information     Schnier, Dolores Lory, MD Follow up in 1 month(s).   Specialties: Vascular Surgery, Cardiology, Radiology, Vascular Surgery Why: With [facility] Contact information: [address] [place] [address] [phone]                 Signed: Drema Pry, NP  10/24/2022, 9:34 AM

## 2022-10-24 NOTE — Progress Notes (Signed)
Nsg Discharge Note  Admit Date:  10/23/2022 Discharge date: 10/24/2022   [name] to be D/C'd Home per MD order.  AVS completed.  Patient/caregiver able to verbalize understanding.  Discharge Medication: Allergies as of 10/24/2022   No Known Allergies      Medication List     TAKE these medications    albuterol 108 (90 Base) MCG/ACT inhaler Commonly known as: VENTOLIN HFA Inhale 2 puffs into the lungs every 6 (six) hours as needed for wheezing or shortness of breath.   aspirin EC 81 MG tablet Take 1 tablet (81 mg total) by mouth daily. What changed:  medication strength how much to take   atorvastatin 40 MG tablet Commonly known as: LIPITOR Take 40 mg by mouth daily.   clopidogrel 75 MG tablet Commonly known as: Plavix Take 1 tablet (75 mg total) by mouth daily.   esomeprazole 20 MG capsule Commonly known as: NEXIUM Take 20 mg by mouth every morning.   fenofibrate 145 MG tablet Commonly known as: TRICOR Take 145 mg by mouth every morning.   lisinopril 10 MG tablet Commonly known as: ZESTRIL Take 10 mg by mouth every morning.   meloxicam 15 MG tablet Commonly known as: MOBIC Take 15 mg by mouth daily.   metFORMIN 500 MG tablet Commonly known as: GLUCOPHAGE Take 500 mg by mouth 2 (two) times daily with a meal.   Multi-Vitamins Tabs Take 1 tablet by mouth every morning.   nicotine 7 mg/24hr patch Commonly known as: NICODERM CQ - dosed in mg/24 hr Place 7 mg onto the skin daily.   oxyCODONE-acetaminophen 5-325 MG tablet Commonly known as: PERCOCET/ROXICET Take 1-2 tablets by mouth every 4 (four) hours as needed for moderate pain.   pioglitazone 15 MG tablet Commonly known as: ACTOS Take 15 mg by mouth daily.   sildenafil 20 MG tablet Commonly known as: REVATIO Take 20-60 mg by mouth daily as needed.        Discharge Assessment: Vitals:   10/24/22 1000 10/24/22 1100  BP: (!) 115/57 115/61  Pulse: 83 85  Resp: 20 (!) 22  Temp:  98.4 F  (36.9 C)  SpO2: 95% 96%   Skin clean, dry and intact without evidence of skin break down, no evidence of skin tears noted. IV catheter discontinued intact. Site without signs and symptoms of complications - no redness or edema noted at insertion site, patient denies c/o pain - only slight tenderness at site.  Dressing with slight pressure applied.  D/c Instructions-Education: Discharge instructions given to patient/family with verbalized understanding. D/c education completed with patient/family including follow up instructions, medication list, d/c activities limitations if indicated, with other d/c instructions as indicated by MD - patient able to verbalize understanding, all questions fully answered. Patient instructed to return to ED, call 911, or call MD for any changes in condition.  Patient escorted via [place], and D/C home via private auto.  Tresa Endo, RN 10/24/2022 12:22 PM

## 2022-10-25 ENCOUNTER — Ambulatory Visit: Admission: RE | Admit: 2022-10-25 | Payer: BC Managed Care – PPO | Source: Ambulatory Visit

## 2022-10-29 ENCOUNTER — Ambulatory Visit: Payer: BC Managed Care – PPO | Admitting: Urology

## 2022-11-25 ENCOUNTER — Other Ambulatory Visit (INDEPENDENT_AMBULATORY_CARE_PROVIDER_SITE_OTHER): Payer: Self-pay | Admitting: Vascular Surgery

## 2022-11-25 DIAGNOSIS — I7143 Infrarenal abdominal aortic aneurysm, without rupture: Secondary | ICD-10-CM

## 2022-11-27 ENCOUNTER — Ambulatory Visit (INDEPENDENT_AMBULATORY_CARE_PROVIDER_SITE_OTHER): Payer: BC Managed Care – PPO

## 2022-11-27 ENCOUNTER — Ambulatory Visit (INDEPENDENT_AMBULATORY_CARE_PROVIDER_SITE_OTHER): Payer: BC Managed Care – PPO | Admitting: Nurse Practitioner

## 2022-11-27 VITALS — BP 138/85 | HR 92 | Resp 16 | Ht 69.0 in | Wt 232.6 lb

## 2022-11-27 DIAGNOSIS — I152 Hypertension secondary to endocrine disorders: Secondary | ICD-10-CM

## 2022-11-27 DIAGNOSIS — I7143 Infrarenal abdominal aortic aneurysm, without rupture: Secondary | ICD-10-CM

## 2022-11-27 DIAGNOSIS — E1159 Type 2 diabetes mellitus with other circulatory complications: Secondary | ICD-10-CM

## 2022-11-27 DIAGNOSIS — I70213 Atherosclerosis of native arteries of extremities with intermittent claudication, bilateral legs: Secondary | ICD-10-CM

## 2022-11-28 ENCOUNTER — Encounter (INDEPENDENT_AMBULATORY_CARE_PROVIDER_SITE_OTHER): Payer: Self-pay | Admitting: Nurse Practitioner

## 2022-11-28 NOTE — Progress Notes (Signed)
Subjective:    Patient ID: [name], male    DOB: [birth year], 61 y.o.   MRN: [id number] Chief Complaint  Patient presents with   Follow-up    The patient returns to the office for surveillance of an abdominal aortic aneurysm status post stent graft placement on 10/23/2022.   Patient denies abdominal pain or back pain, no other abdominal complaints. No groin related complaints. No symptoms consistent with distal embolization No changes in claudication distance or new rest pain symptoms.   He has just a very small wound near his incision site.  He notes it still little bit sore somewhat but overall he is doing well.  There have been no interval changes in his overall healthcare since his last visit.   Patient denies amaurosis fugax or TIA symptoms.  The patient denies recent episodes of angina or shortness of breath.   Duplex US of the aorta and iliac arteries shows a 4.0 AAA sac with no endoleak, decrease in the sac compared to the previous study.  There is some stenosis noted.  The left common iliac artery and proximal external iliac artery.  There is also noted seroma in the left groin of 3.48 x 1.7 cm    Review of Systems  Skin:  Positive for wound.  All other systems reviewed and are negative.      Objective:   Physical Exam Vitals reviewed.  HENT:     Head: Normocephalic.  Cardiovascular:     Rate and Rhythm: Normal rate.  Pulmonary:     Effort: Pulmonary effort is normal.  Skin:    General: Skin is warm and dry.  Neurological:     Mental Status: He is alert and oriented to person, place, and time.  Psychiatric:        Mood and Affect: Mood normal.        Behavior: Behavior normal.        Thought Content: Thought content normal.        Judgment: Judgment normal.     BP 138/85 (BP Location: Left Arm)   Pulse 92   Resp 16   Ht 5\' 9"  (1.753 m)   Wt 232 lb 9.6 oz (105.5 kg)   BMI 34.35 kg/m   Past Medical History:  Diagnosis Date   Aortic  atherosclerosis (HCC)    Arthritis    Benign fibroma of prostate 12/01/2013   BPH (benign prostatic hyperplasia)    CAD (coronary artery disease)    a.) PCI in ~ 2004 in [place], [place] -> location and stent type unknown   Carotid artery occlusion    COPD (chronic obstructive pulmonary disease) (HCC) 03/25/2014   Diastolic dysfunction 11/02/2020   a.) TTE 11/02/2020: EF 60-65%, G1DD.   Diverticulosis    Erectile dysfunction    a.) on PDE5i (sildenafil)   GERD (gastroesophageal reflux disease) 12/01/2013   Hepatic steatosis    History of kidney stones 03/14/2015   HLD (hyperlipidemia) 12/01/2013   Hypertension    Iliac artery stenosis, bilateral (HCC)    a.) CTA AP 09/23/2022: severe stenosis of the BILATERAL ostial CIAs.   Infrarenal abdominal aortic aneurysm (AAA) without rupture (HCC)    a.) CT renal 01/28/2015: 3.8 cm; b.) lumbar MRI 04/11/2016: 3.7 cm; c.) CT renal 12/24/2017: 3.7 x 3.8 cm; d.) CT AP 08/22/2022: 4.9 cm; e.) CTA AP 09/23/2022: 4.6 cm   T2DM (type 2 diabetes mellitus) (HCC)     Social History   Socioeconomic History   Marital  status: Widowed    Spouse name: Not on file   Number of children: 2   Years of education: Not on file   Highest education level: Not on file  Occupational History   Not on file  Tobacco Use   Smoking status: Some Days    Packs/day: 10.00    Years: 0.50    Additional pack years: 0.00    Total pack years: 5.00    Types: Cigarettes    Passive exposure: Current   Smokeless tobacco: Never  Vaping Use   Vaping Use: Never used  Substance and Sexual Activity   Alcohol use: Yes    Comment: occasionally   Drug use: No   Sexual activity: Not on file  Other Topics Concern   Not on file  Social History Narrative   Lives by himself with a room mate; kids live in [place], [place]   Social Determinants of Health   Financial Resource Strain: Not on file  Food Insecurity: No Food Insecurity (10/23/2022)   Hunger Vital Sign    Worried  About Running Out of Food in the Last Year: Never true    Ran Out of Food in the Last Year: Never true  Transportation Needs: No Transportation Needs (10/23/2022)   PRAPARE - Administrator, Civil Service (Medical): No    Lack of Transportation (Non-Medical): No  Physical Activity: Not on file  Stress: Not on file  Social Connections: Not on file  Intimate Partner Violence: Not At Risk (10/23/2022)   Humiliation, Afraid, Rape, and Kick questionnaire    Fear of Current or Ex-Partner: No    Emotionally Abused: No    Physically Abused: No    Sexually Abused: No    Past Surgical History:  Procedure Laterality Date   APPENDECTOMY     CARDIAC CATHETERIZATION     CORONARY ANGIOPLASTY  2004?   [place], [place]   CYSTOSCOPY W/ URETERAL STENT PLACEMENT Left 01/28/2015   Procedure: CYSTOSCOPY WITH RETROGRADE PYELOGRAM/URETERAL STENT PLACEMENT;  Surgeon: Sebastian Ache, MD;  Location: [facility];  Service: Urology;  Laterality: Left;   CYSTOSCOPY/URETEROSCOPY/HOLMIUM LASER/STENT PLACEMENT Left 02/08/2015   Procedure: CYSTOSCOPY/URETEROSCOPY/HOLMIUM LASER/STENT PLACEMENT;  Surgeon: Vanna Scotland, MD;  Location: [facility];  Service: Urology;  Laterality: Left;   CYSTOSCOPY/URETEROSCOPY/HOLMIUM LASER/STENT PLACEMENT Left 01/07/2018   Procedure: CYSTOSCOPY/URETEROSCOPY/HOLMIUM LASER/STENT PLACEMENT;  Surgeon: Vanna Scotland, MD;  Location: [facility];  Service: Urology;  Laterality: Left;   CYSTOSCOPY/URETEROSCOPY/HOLMIUM LASER/STENT PLACEMENT Bilateral 09/09/2022   Procedure: CYSTOSCOPY/URETEROSCOPY/HOLMIUM LASER/STENT PLACEMENT/RETROGRADE;  Surgeon: Vanna Scotland, MD;  Location: [facility];  Service: Urology;  Laterality: Bilateral;   ENDOVASCULAR REPAIR/STENT GRAFT N/A 10/23/2022   Procedure: ENDOVASCULAR REPAIR/STENT GRAFT;  Surgeon: Renford Dills, MD;  Location: [facility];  Service: Cardiovascular;  Laterality: N/A;   FACIAL COSMETIC SURGERY     21 fractures in left cheek and face  from baseball   LITHOTRIPSY     VASECTOMY      Family History  Problem Relation Age of Onset   Diabetes Mother    Diabetes Maternal Uncle    Cancer Maternal Uncle    Cancer Maternal Uncle    Prostate cancer Neg Hx    Bladder Cancer Neg Hx    Kidney cancer Neg Hx     No Known Allergies     Latest Ref Rng & Units 10/24/2022    6:30 AM 10/23/2022   11:53 AM 10/21/2022   10:33 AM  CBC  WBC 4.0 - 10.5 K/uL 13.1  14.1  10.6   Hemoglobin 13.0 - 17.0 g/dL 78.2  95.6  21.3   Hematocrit 39.0 - 52.0 % 35.9  42.0  47.1   Platelets 150 - 400 K/uL 234  271  327       CMP     Component Value Date/Time   NA 136 10/24/2022 0630   NA 135 (L) 09/25/2012 1155   K 3.7 10/24/2022 0630   K 3.7 09/25/2012 1155   CL 108 10/24/2022 0630   CL 102 09/25/2012 1155   CO2 22 10/24/2022 0630   CO2 26 09/25/2012 1155   GLUCOSE 209 (H) 10/24/2022 0630   GLUCOSE 95 09/25/2012 1155   BUN 13 10/24/2022 0630   BUN 16 09/25/2012 1155   CREATININE 0.86 10/24/2022 0630   CREATININE 0.90 09/25/2012 1155   CALCIUM 8.1 (L) 10/24/2022 0630   CALCIUM 8.8 09/25/2012 1155   PROT 6.7 08/22/2022 0948   PROT 8.2 09/25/2012 1155   ALBUMIN 3.8 08/22/2022 0948   ALBUMIN 3.9 09/25/2012 1155   AST 21 08/22/2022 0948   AST 24 09/25/2012 1155   ALT 22 08/22/2022 0948   ALT 47 09/25/2012 1155   ALKPHOS 35 (L) 08/22/2022 0948   ALKPHOS 89 09/25/2012 1155   BILITOT 0.6 08/22/2022 0948   BILITOT 0.6 09/25/2012 1155   GFRNONAA >60 10/24/2022 0630   GFRNONAA >60 09/25/2012 1155   GFRAA >60 05/04/2019 2004   GFRAA >60 09/25/2012 1155     No results found.     Assessment & Plan:   1. Atherosclerosis of native artery of both lower extremities with intermittent claudication (HCC) The patient has a very tiny wound on his left groin.  He has been advised to utilize some hydrocolloid bandage to help heal the wound.  His pai and claudication is much improved prior to mention.  He is advised to continue with Plavix and  aspirin.  Will plan to have the patient return in 3 months or sooner if the patient has any worsening of his groin wound.  2. Infrarenal abdominal aortic aneurysm (AAA) without rupture (HCC) Recommend:  Patient is status post successful endovascular repair of the AAA.   No further intervention is required at this time.   No endoleak is detected and the aneurysm sac is stable.  The patient will continue antiplatelet therapy as prescribed as well as aggressive management of hyperlipidemia. Exercise is again strongly encouraged.   However, endografts require continued surveillance with ultrasound or CT scan. This is mandatory to detect any changes that allow repressurization of the aneurysm sac.  The patient is informed that this would be asymptomatic.  The patient is reminded that lifelong routine surveillance is a necessity with an endograft. Patient will continue to follow-up at the specified interval with ultrasound of the aorta.  3. Hypertension associated with diabetes (HCC) Continue antihypertensive medications as already ordered, these medications have been reviewed and there are no changes at this time.  4. Type 2 diabetes mellitus with other circulatory complication, unspecified whether long term insulin use (HCC) Continue hypoglycemic medications as already ordered, these medications have been reviewed and there are no changes at this time.  Hgb A1C to be monitored as already arranged by primary service   Current Outpatient Medications on File Prior to Visit  Medication Sig Dispense Refill   albuterol (VENTOLIN HFA) 108 (90 Base) MCG/ACT inhaler Inhale 2 puffs into the lungs every 6 (six) hours as needed for wheezing or shortness of breath. 16 g 2   aspirin  EC 81 MG tablet Take 1 tablet (81 mg total) by mouth daily. 150 tablet 2   atorvastatin (LIPITOR) 40 MG tablet Take 40 mg by mouth daily.     clopidogrel (PLAVIX) 75 MG tablet Take 1 tablet (75 mg total) by mouth daily. 30  tablet 6   esomeprazole (NEXIUM) 20 MG capsule Take 20 mg by mouth every morning.     fenofibrate (TRICOR) 145 MG tablet Take 145 mg by mouth every morning.   3   lisinopril (PRINIVIL,ZESTRIL) 10 MG tablet Take 10 mg by mouth every morning.      meloxicam (MOBIC) 15 MG tablet Take 15 mg by mouth daily.     metFORMIN (GLUCOPHAGE) 500 MG tablet Take 500 mg by mouth 2 (two) times daily with a meal.  0   Multiple Vitamin (MULTI-VITAMINS) TABS Take 1 tablet by mouth every morning.      nicotine (NICODERM CQ - DOSED IN MG/24 HR) 7 mg/24hr patch Place 7 mg onto the skin daily.     oxyCODONE-acetaminophen (PERCOCET/ROXICET) 5-325 MG tablet Take 1-2 tablets by mouth every 4 (four) hours as needed for moderate pain. 30 tablet 0   pioglitazone (ACTOS) 15 MG tablet Take 15 mg by mouth daily.     sildenafil (REVATIO) 20 MG tablet Take 20-60 mg by mouth daily as needed.     Current Facility-Administered Medications on File Prior to Visit  Medication Dose Route Frequency Provider Last Rate Last Admin   dexamethasone (DECADRON) injection    Anesthesia Intra-op Michaele Offer, CRNA   5 mg at 02/08/15 1339   fentaNYL (SUBLIMAZE) injection    Anesthesia Intra-op Michaele Offer, CRNA   50 mcg at 02/08/15 1416   glycopyrrolate (ROBINUL) injection    Anesthesia Intra-op Michaele Offer, CRNA   0.6 mg at 02/08/15 1419   lidocaine (cardiac) 100 mg/24ml (XYLOCAINE) 20 MG/ML injection 2%   Intravenous Anesthesia Intra-op Michaele Offer, CRNA   100 mg at 02/08/15 1317   midazolam (VERSED) injection    Anesthesia Intra-op Michaele Offer, CRNA   2 mg at 01/07/18 1305   neostigmine (BLOXIVERZ) injection   Intravenous Anesthesia Intra-op Michaele Offer, CRNA   4 mg at 02/08/15 1419   ondansetron (ZOFRAN) injection   Intravenous Anesthesia Intra-op Michaele Offer, CRNA   4 mg at 02/08/15 1421   propofol (DIPRIVAN) 10 mg/mL bolus/IV push    Anesthesia Intra-op Michaele Offer, CRNA   180 mg at 02/08/15 1318   rocuronium [facility])  injection    Anesthesia Intra-op Michaele Offer, CRNA   10 mg at 02/08/15 1349    There are no Patient Instructions on file for this visit. No follow-ups on file.   Georgiana Spinner, NP

## 2023-02-25 ENCOUNTER — Other Ambulatory Visit (INDEPENDENT_AMBULATORY_CARE_PROVIDER_SITE_OTHER): Payer: Self-pay | Admitting: Nurse Practitioner

## 2023-02-25 DIAGNOSIS — I70213 Atherosclerosis of native arteries of extremities with intermittent claudication, bilateral legs: Secondary | ICD-10-CM

## 2023-02-25 DIAGNOSIS — I714 Abdominal aortic aneurysm, without rupture, unspecified: Secondary | ICD-10-CM

## 2023-02-27 ENCOUNTER — Ambulatory Visit (INDEPENDENT_AMBULATORY_CARE_PROVIDER_SITE_OTHER): Payer: BC Managed Care – PPO

## 2023-02-27 ENCOUNTER — Ambulatory Visit (INDEPENDENT_AMBULATORY_CARE_PROVIDER_SITE_OTHER): Payer: BC Managed Care – PPO | Admitting: Nurse Practitioner

## 2023-02-27 ENCOUNTER — Encounter (INDEPENDENT_AMBULATORY_CARE_PROVIDER_SITE_OTHER): Payer: Self-pay | Admitting: Nurse Practitioner

## 2023-02-27 VITALS — BP 143/93 | HR 85 | Resp 18 | Ht 69.0 in | Wt 230.0 lb

## 2023-02-27 DIAGNOSIS — I70213 Atherosclerosis of native arteries of extremities with intermittent claudication, bilateral legs: Secondary | ICD-10-CM

## 2023-02-27 DIAGNOSIS — E1159 Type 2 diabetes mellitus with other circulatory complications: Secondary | ICD-10-CM

## 2023-02-27 DIAGNOSIS — I714 Abdominal aortic aneurysm, without rupture, unspecified: Secondary | ICD-10-CM

## 2023-02-27 DIAGNOSIS — M79605 Pain in left leg: Secondary | ICD-10-CM

## 2023-02-27 DIAGNOSIS — I152 Hypertension secondary to endocrine disorders: Secondary | ICD-10-CM

## 2023-02-27 DIAGNOSIS — I7143 Infrarenal abdominal aortic aneurysm, without rupture: Secondary | ICD-10-CM

## 2023-02-27 LAB — VAS US ABI WITH/WO TBI
Left ABI: 1.03
Right ABI: 1.05

## 2023-02-27 NOTE — Progress Notes (Signed)
Subjective:    Patient ID: [name], male    DOB: 08/23/1960, 62 y.o.   MRN: [id number] Chief Complaint  Patient presents with   Follow-up    3 month follow up with EVAR & AB    [name] is a 62 year old male who returns today for follow-up evaluation regarding his abdominal aortic aneurysm as well as peripheral arterial disease.  He underwent intervention on 10/23/2022.  Prior to intervention he did have some claudication-like symptoms in his legs and hips and he notes that it improved on his right but not so much on the left.  He has pain that radiates from his buttocks area down his leg.  He also has some pain and tightness in his left calf.  He notes that when he stands for long.  His knee and the left leg feels weak and it feels as if it is giving out.  He notes that the pain that is shooting is more so worse with activity.  Today the patient has a right ABI of 1.05 and a left of 1.03.  He has strong triphasic tibial artery waveforms bilaterally.  Today the patient's endovascular aneurysm repair has no evidence of endoleak.  Largest diameter is measured at 4.2 cm.    Review of Systems  Musculoskeletal:  Positive for arthralgias and back pain.  All other systems reviewed and are negative.      Objective:   Physical Exam Vitals reviewed.  HENT:     Head: Normocephalic.  Cardiovascular:     Rate and Rhythm: Normal rate.     Pulses:          Dorsalis pedis pulses are detected w/ Doppler on the right side and 1+ on the left side.       Posterior tibial pulses are detected w/ Doppler on the right side and 1+ on the left side.  Skin:    General: Skin is warm and dry.  Neurological:     Mental Status: He is alert and oriented to person, place, and time.  Psychiatric:        Mood and Affect: Mood normal.        Behavior: Behavior normal.        Thought Content: Thought content normal.        Judgment: Judgment normal.     BP (!) 143/93 (BP Location: Left Arm)    Pulse 85   Resp 18   Ht 5\' 9"  (1.753 m)   Wt 230 lb (104.3 kg)   BMI 33.97 kg/m   Past Medical History:  Diagnosis Date   Aortic atherosclerosis (HCC)    Arthritis    Benign fibroma of prostate 12/01/2013   BPH (benign prostatic hyperplasia)    CAD (coronary artery disease)    a.) PCI in ~ 2004 in [facility], [place] -> location and stent type unknown   Carotid artery occlusion    COPD (chronic obstructive pulmonary disease) (HCC) 03/25/2014   Diastolic dysfunction 11/02/2020   a.) TTE 11/02/2020: EF 60-65%, G1DD.   Diverticulosis    Erectile dysfunction    a.) on PDE5i (sildenafil)   GERD (gastroesophageal reflux disease) 12/01/2013   Hepatic steatosis    History of kidney stones 03/14/2015   HLD (hyperlipidemia) 12/01/2013   Hypertension    Iliac artery stenosis, bilateral (HCC)    a.) CTA AP 09/23/2022: severe stenosis of the BILATERAL ostial CIAs.   Infrarenal abdominal aortic aneurysm (AAA) without rupture (HCC)    a.) CT  renal 01/28/2015: 3.8 cm; b.) lumbar MRI 04/11/2016: 3.7 cm; c.) CT renal 12/24/2017: 3.7 x 3.8 cm; d.) CT AP 08/22/2022: 4.9 cm; e.) CTA AP 09/23/2022: 4.6 cm   T2DM (type 2 diabetes mellitus) (HCC)     Social History   Socioeconomic History   Marital status: Widowed    Spouse name: Not on file   Number of children: 2   Years of education: Not on file   Highest education level: Not on file  Occupational History   Not on file  Tobacco Use   Smoking status: Some Days    Current packs/day: 10.00    Average packs/day: 10.0 packs/day for 0.5 years (5.0 ttl pk-yrs)    Types: Cigarettes    Passive exposure: Current   Smokeless tobacco: Never  Vaping Use   Vaping status: Never Used  Substance and Sexual Activity   Alcohol use: Yes    Comment: occasionally   Drug use: No   Sexual activity: Not on file  Other Topics Concern   Not on file  Social History Narrative   Lives by himself with a room mate; kids live in [place], [place]   Social  Determinants of Health   Financial Resource Strain: Not on file  Food Insecurity: No Food Insecurity (10/23/2022)   Hunger Vital Sign    Worried About Running Out of Food in the Last Year: Never true    Ran Out of Food in the Last Year: Never true  Transportation Needs: No Transportation Needs (10/23/2022)   PRAPARE - Administrator, Civil Service (Medical): No    Lack of Transportation (Non-Medical): No  Physical Activity: Not on file  Stress: Not on file  Social Connections: Not on file  Intimate Partner Violence: Not At Risk (10/23/2022)   Humiliation, Afraid, Rape, and Kick questionnaire    Fear of Current or Ex-Partner: No    Emotionally Abused: No    Physically Abused: No    Sexually Abused: No    Past Surgical History:  Procedure Laterality Date   APPENDECTOMY     CARDIAC CATHETERIZATION     CORONARY ANGIOPLASTY  2004?   [place], [place]   CYSTOSCOPY W/ URETERAL STENT PLACEMENT Left 01/28/2015   Procedure: CYSTOSCOPY WITH RETROGRADE PYELOGRAM/URETERAL STENT PLACEMENT;  Surgeon: Sebastian Ache, MD;  Location: [facility];  Service: Urology;  Laterality: Left;   CYSTOSCOPY/URETEROSCOPY/HOLMIUM LASER/STENT PLACEMENT Left 02/08/2015   Procedure: CYSTOSCOPY/URETEROSCOPY/HOLMIUM LASER/STENT PLACEMENT;  Surgeon: Vanna Scotland, MD;  Location: [facility];  Service: Urology;  Laterality: Left;   CYSTOSCOPY/URETEROSCOPY/HOLMIUM LASER/STENT PLACEMENT Left 01/07/2018   Procedure: CYSTOSCOPY/URETEROSCOPY/HOLMIUM LASER/STENT PLACEMENT;  Surgeon: Vanna Scotland, MD;  Location: [facility];  Service: Urology;  Laterality: Left;   CYSTOSCOPY/URETEROSCOPY/HOLMIUM LASER/STENT PLACEMENT Bilateral 09/09/2022   Procedure: CYSTOSCOPY/URETEROSCOPY/HOLMIUM LASER/STENT PLACEMENT/RETROGRADE;  Surgeon: Vanna Scotland, MD;  Location: [facility];  Service: Urology;  Laterality: Bilateral;   ENDOVASCULAR REPAIR/STENT GRAFT N/A 10/23/2022   Procedure: ENDOVASCULAR REPAIR/STENT GRAFT;  Surgeon: Renford Dills,  MD;  Location: [facility];  Service: Cardiovascular;  Laterality: N/A;   FACIAL COSMETIC SURGERY     21 fractures in left cheek and face from baseball   LITHOTRIPSY     VASECTOMY      Family History  Problem Relation Age of Onset   Diabetes Mother    Diabetes Maternal Uncle    Cancer Maternal Uncle    Cancer Maternal Uncle    Prostate cancer Neg Hx    Bladder Cancer Neg Hx    Kidney  cancer Neg Hx     No Known Allergies     Latest Ref Rng & Units 10/24/2022    6:30 AM 10/23/2022   11:53 AM 10/21/2022   10:33 AM  CBC  WBC 4.0 - 10.5 K/uL 13.1  14.1  10.6   Hemoglobin 13.0 - 17.0 g/dL 27.0  62.3  76.2   Hematocrit 39.0 - 52.0 % 35.9  42.0  47.1   Platelets 150 - 400 K/uL 234  271  327       CMP     Component Value Date/Time   NA 136 10/24/2022 0630   NA 135 (L) 09/25/2012 1155   K 3.7 10/24/2022 0630   K 3.7 09/25/2012 1155   CL 108 10/24/2022 0630   CL 102 09/25/2012 1155   CO2 22 10/24/2022 0630   CO2 26 09/25/2012 1155   GLUCOSE 209 (H) 10/24/2022 0630   GLUCOSE 95 09/25/2012 1155   BUN 13 10/24/2022 0630   BUN 16 09/25/2012 1155   CREATININE 0.86 10/24/2022 0630   CREATININE 0.90 09/25/2012 1155   CALCIUM 8.1 (L) 10/24/2022 0630   CALCIUM 8.8 09/25/2012 1155   PROT 6.7 08/22/2022 0948   PROT 8.2 09/25/2012 1155   ALBUMIN 3.8 08/22/2022 0948   ALBUMIN 3.9 09/25/2012 1155   AST 21 08/22/2022 0948   AST 24 09/25/2012 1155   ALT 22 08/22/2022 0948   ALT 47 09/25/2012 1155   ALKPHOS 35 (L) 08/22/2022 0948   ALKPHOS 89 09/25/2012 1155   BILITOT 0.6 08/22/2022 0948   BILITOT 0.6 09/25/2012 1155   GFRNONAA >60 10/24/2022 0630   GFRNONAA >60 09/25/2012 1155     No results found.     Assessment & Plan:   1. Infrarenal abdominal aortic aneurysm (AAA) without rupture (HCC) Recommend:  Patient is status post successful endovascular repair of the AAA.   No further intervention is required at this time.   No endoleak is detected and the aneurysm  sac is stable.  The patient will continue antiplatelet therapy as prescribed as well as aggressive management of hyperlipidemia. Exercise is again strongly encouraged.   However, endografts require continued surveillance with ultrasound or CT scan. This is mandatory to detect any changes that allow repressurization of the aneurysm sac.  The patient is informed that this would be asymptomatic.  The patient is reminded that lifelong routine surveillance is a necessity with an endograft. Patient will continue to follow-up at the specified interval with ultrasound of the aorta.  2. Atherosclerosis of native artery of both lower extremities with intermittent claudication (HCC) The patient continues to have pain postintervention.  Based on the description of symptoms and his noninvasive studies today, I suspect this is more so related to sciatic pain and possibly some lower back issues.  In regards to his atherosclerotic disease his studies today do not show any significant worsening of peripheral arterial disease.  Patient is advised to continue with his antiplatelet therapy.  Patient will return in 6 months with noninvasive studies or earlier if issues arise.  3. Hypertension associated with diabetes (HCC) Continue antihypertensive medications as already ordered, these medications have been reviewed and there are no changes at this time.  4. Left leg pain Based upon the patient description of pain I suspect that the left leg pain is related to sciatica and underlying back issues.  The patient had an MRI in 2017 which showed moderate lower lumbar facet arthritis which was worse on the left.  I have suggested  the patient have further workup done of his back.  I have recommended referral versus follow-up with PCP and at this time patient wishes to follow-up with his PCP.   Current Outpatient Medications on File Prior to Visit  Medication Sig Dispense Refill   albuterol (VENTOLIN HFA) 108 (90 Base)  MCG/ACT inhaler Inhale 2 puffs into the lungs every 6 (six) hours as needed for wheezing or shortness of breath. 16 g 2   aspirin EC 81 MG tablet Take 1 tablet (81 mg total) by mouth daily. 150 tablet 2   atorvastatin (LIPITOR) 40 MG tablet Take 40 mg by mouth daily.     clopidogrel (PLAVIX) 75 MG tablet Take 1 tablet (75 mg total) by mouth daily. 30 tablet 6   esomeprazole (NEXIUM) 20 MG capsule Take 20 mg by mouth every morning.     fenofibrate (TRICOR) 145 MG tablet Take 145 mg by mouth every morning.   3   lisinopril (PRINIVIL,ZESTRIL) 10 MG tablet Take 10 mg by mouth every morning.      meloxicam (MOBIC) 15 MG tablet Take 15 mg by mouth daily.     metFORMIN (GLUCOPHAGE) 500 MG tablet Take 500 mg by mouth 2 (two) times daily with a meal.  0   Multiple Vitamin (MULTI-VITAMINS) TABS Take 1 tablet by mouth every morning.      nicotine (NICODERM CQ - DOSED IN MG/24 HR) 7 mg/24hr patch Place 7 mg onto the skin daily.     oxyCODONE-acetaminophen (PERCOCET/ROXICET) 5-325 MG tablet Take 1-2 tablets by mouth every 4 (four) hours as needed for moderate pain. 30 tablet 0   pioglitazone (ACTOS) 15 MG tablet Take 15 mg by mouth daily.     sildenafil (REVATIO) 20 MG tablet Take 20-60 mg by mouth daily as needed.     Current Facility-Administered Medications on File Prior to Visit  Medication Dose Route Frequency Provider Last Rate Last Admin   dexamethasone (DECADRON) injection    Anesthesia Intra-op Michaele Offer, CRNA   5 mg at 02/08/15 1339   fentaNYL (SUBLIMAZE) injection    Anesthesia Intra-op Michaele Offer, CRNA   50 mcg at 02/08/15 1416   glycopyrrolate (ROBINUL) injection    Anesthesia Intra-op Michaele Offer, CRNA   0.6 mg at 02/08/15 1419   lidocaine (cardiac) 100 mg/44ml (XYLOCAINE) 20 MG/ML injection 2%   Intravenous Anesthesia Intra-op Michaele Offer, CRNA   100 mg at 02/08/15 1317   midazolam (VERSED) injection    Anesthesia Intra-op Michaele Offer, CRNA   2 mg at 01/07/18 1305   neostigmine  (BLOXIVERZ) injection   Intravenous Anesthesia Intra-op Michaele Offer, CRNA   4 mg at 02/08/15 1419   ondansetron (ZOFRAN) injection   Intravenous Anesthesia Intra-op Michaele Offer, CRNA   4 mg at 02/08/15 1421   propofol (DIPRIVAN) 10 mg/mL bolus/IV push    Anesthesia Intra-op Michaele Offer, CRNA   180 mg at 02/08/15 1318   [facility]) injection    Anesthesia Intra-op Michaele Offer, CRNA   10 mg at 02/08/15 1349    There are no Patient Instructions on file for this visit. No follow-ups on file.   Georgiana Spinner, NP

## 2023-03-18 DIAGNOSIS — F17209 Nicotine dependence, unspecified, with unspecified nicotine-induced disorders: Secondary | ICD-10-CM | POA: Diagnosis not present

## 2023-03-18 DIAGNOSIS — R809 Proteinuria, unspecified: Secondary | ICD-10-CM | POA: Diagnosis not present

## 2023-03-18 DIAGNOSIS — E1129 Type 2 diabetes mellitus with other diabetic kidney complication: Secondary | ICD-10-CM | POA: Diagnosis not present

## 2023-03-18 DIAGNOSIS — I10 Essential (primary) hypertension: Secondary | ICD-10-CM | POA: Diagnosis not present

## 2023-03-18 DIAGNOSIS — J449 Chronic obstructive pulmonary disease, unspecified: Secondary | ICD-10-CM | POA: Diagnosis not present

## 2023-03-18 DIAGNOSIS — E782 Mixed hyperlipidemia: Secondary | ICD-10-CM | POA: Diagnosis not present

## 2023-03-18 DIAGNOSIS — Z125 Encounter for screening for malignant neoplasm of prostate: Secondary | ICD-10-CM | POA: Diagnosis not present

## 2023-04-03 ENCOUNTER — Ambulatory Visit: Payer: BC Managed Care – PPO | Attending: Cardiovascular Disease | Admitting: Cardiovascular Disease

## 2023-04-04 ENCOUNTER — Encounter: Payer: Self-pay | Admitting: Cardiovascular Disease

## 2023-04-07 ENCOUNTER — Other Ambulatory Visit: Payer: Self-pay | Admitting: Physical Medicine & Rehabilitation

## 2023-04-07 DIAGNOSIS — M5441 Lumbago with sciatica, right side: Secondary | ICD-10-CM | POA: Diagnosis not present

## 2023-04-07 DIAGNOSIS — R7989 Other specified abnormal findings of blood chemistry: Secondary | ICD-10-CM | POA: Diagnosis not present

## 2023-04-07 DIAGNOSIS — M5416 Radiculopathy, lumbar region: Secondary | ICD-10-CM | POA: Diagnosis not present

## 2023-04-07 DIAGNOSIS — M5442 Lumbago with sciatica, left side: Secondary | ICD-10-CM | POA: Diagnosis not present

## 2023-04-07 DIAGNOSIS — G8929 Other chronic pain: Secondary | ICD-10-CM | POA: Diagnosis not present

## 2023-04-07 DIAGNOSIS — I10 Essential (primary) hypertension: Secondary | ICD-10-CM | POA: Diagnosis not present

## 2023-04-14 ENCOUNTER — Ambulatory Visit
Admission: RE | Admit: 2023-04-14 | Discharge: 2023-04-14 | Disposition: A | Discharge status: Home / Self Care | Payer: BC Managed Care – PPO | Source: Ambulatory Visit | Attending: Physical Medicine & Rehabilitation | Admitting: Physical Medicine & Rehabilitation

## 2023-04-14 DIAGNOSIS — M5442 Lumbago with sciatica, left side: Secondary | ICD-10-CM

## 2023-04-14 DIAGNOSIS — M5126 Other intervertebral disc displacement, lumbar region: Secondary | ICD-10-CM | POA: Diagnosis not present

## 2023-04-14 DIAGNOSIS — M47816 Spondylosis without myelopathy or radiculopathy, lumbar region: Secondary | ICD-10-CM | POA: Diagnosis not present

## 2023-04-23 ENCOUNTER — Other Ambulatory Visit: Payer: Self-pay | Admitting: Urology

## 2023-05-12 DIAGNOSIS — M5441 Lumbago with sciatica, right side: Secondary | ICD-10-CM | POA: Diagnosis not present

## 2023-05-12 DIAGNOSIS — M5442 Lumbago with sciatica, left side: Secondary | ICD-10-CM | POA: Diagnosis not present

## 2023-05-12 DIAGNOSIS — M5416 Radiculopathy, lumbar region: Secondary | ICD-10-CM | POA: Diagnosis not present

## 2023-05-12 DIAGNOSIS — G8929 Other chronic pain: Secondary | ICD-10-CM | POA: Diagnosis not present

## 2023-05-30 DIAGNOSIS — R809 Proteinuria, unspecified: Secondary | ICD-10-CM | POA: Diagnosis not present

## 2023-05-30 DIAGNOSIS — E1129 Type 2 diabetes mellitus with other diabetic kidney complication: Secondary | ICD-10-CM | POA: Diagnosis not present

## 2023-05-30 DIAGNOSIS — M5416 Radiculopathy, lumbar region: Secondary | ICD-10-CM | POA: Diagnosis not present

## 2023-06-16 DIAGNOSIS — G8929 Other chronic pain: Secondary | ICD-10-CM | POA: Diagnosis not present

## 2023-06-16 DIAGNOSIS — M5442 Lumbago with sciatica, left side: Secondary | ICD-10-CM | POA: Diagnosis not present

## 2023-06-16 DIAGNOSIS — M5416 Radiculopathy, lumbar region: Secondary | ICD-10-CM | POA: Diagnosis not present

## 2023-06-16 DIAGNOSIS — M5441 Lumbago with sciatica, right side: Secondary | ICD-10-CM | POA: Diagnosis not present

## 2023-07-11 DIAGNOSIS — E1129 Type 2 diabetes mellitus with other diabetic kidney complication: Secondary | ICD-10-CM | POA: Diagnosis not present

## 2023-07-11 DIAGNOSIS — M5416 Radiculopathy, lumbar region: Secondary | ICD-10-CM | POA: Diagnosis not present

## 2023-07-11 DIAGNOSIS — R809 Proteinuria, unspecified: Secondary | ICD-10-CM | POA: Diagnosis not present

## 2023-07-25 DIAGNOSIS — M5442 Lumbago with sciatica, left side: Secondary | ICD-10-CM | POA: Diagnosis not present

## 2023-07-25 DIAGNOSIS — M5416 Radiculopathy, lumbar region: Secondary | ICD-10-CM | POA: Diagnosis not present

## 2023-07-25 DIAGNOSIS — M5441 Lumbago with sciatica, right side: Secondary | ICD-10-CM | POA: Diagnosis not present

## 2023-07-25 DIAGNOSIS — G8929 Other chronic pain: Secondary | ICD-10-CM | POA: Diagnosis not present

## 2023-08-08 DIAGNOSIS — M25552 Pain in left hip: Secondary | ICD-10-CM | POA: Diagnosis not present

## 2023-08-08 DIAGNOSIS — M5442 Lumbago with sciatica, left side: Secondary | ICD-10-CM | POA: Diagnosis not present

## 2023-08-08 DIAGNOSIS — M5441 Lumbago with sciatica, right side: Secondary | ICD-10-CM | POA: Diagnosis not present

## 2023-08-08 DIAGNOSIS — M5416 Radiculopathy, lumbar region: Secondary | ICD-10-CM | POA: Diagnosis not present

## 2023-08-15 DIAGNOSIS — G8929 Other chronic pain: Secondary | ICD-10-CM | POA: Diagnosis not present

## 2023-08-15 DIAGNOSIS — R809 Proteinuria, unspecified: Secondary | ICD-10-CM | POA: Diagnosis not present

## 2023-08-15 DIAGNOSIS — M25552 Pain in left hip: Secondary | ICD-10-CM | POA: Diagnosis not present

## 2023-08-15 DIAGNOSIS — E1129 Type 2 diabetes mellitus with other diabetic kidney complication: Secondary | ICD-10-CM | POA: Diagnosis not present

## 2023-08-21 ENCOUNTER — Other Ambulatory Visit: Payer: Self-pay | Admitting: Urology

## 2023-08-27 ENCOUNTER — Other Ambulatory Visit (INDEPENDENT_AMBULATORY_CARE_PROVIDER_SITE_OTHER): Payer: Self-pay | Admitting: Nurse Practitioner

## 2023-08-27 DIAGNOSIS — I7143 Infrarenal abdominal aortic aneurysm, without rupture: Secondary | ICD-10-CM

## 2023-08-28 DIAGNOSIS — I10 Essential (primary) hypertension: Secondary | ICD-10-CM | POA: Insufficient documentation

## 2023-08-28 NOTE — Progress Notes (Signed)
 MRN : [id number]  [name] is a 63 y.o. (March 12, 1961) male who presents with chief complaint of check circulation.  History of Present Illness:   The patient returns to the office for surveillance of an abdominal aortic aneurysm status post stent graft placement on 10/23/2022.   Procedure: Placement of a C3 26 x 14 x 14 Gore Excluder Endoprosthesis main body with a 12 x 12 contralateral iliac limb  Patient denies abdominal pain or back pain, no other abdominal complaints.  No symptoms consistent with distal embolization No changes in claudication distance or new rest pain symptoms. No interval development of new ulcers or wounds  There have been no significant interval changes in his overall healthcare since his last visit.   Patient denies amaurosis fugax or TIA symptoms.  The patient denies recent episodes of angina or shortness of breath.   Duplex US  of the aorta and iliac arteries shows a 4.18 cm AAA sac with no endoleak, slight decrease in the sac compared to the previous study.  Previous CT angiography of the abdomen and pelvis dated 09/23/2022 shows an infrarenal AAA 4.9 cm.  There is some mural thrombus noted in the visceral segment.  There is greater than 80% bilateral common iliac artery stenosis at the level of the aortic bifurcation.   No outpatient medications have been marked as taking for the 09/01/23 encounter (Appointment) with Prescilla Brod, Ninette Basque, MD.    Past Medical History:  Diagnosis Date   Aortic atherosclerosis [facility]    Arthritis    Benign fibroma of prostate 12/01/2013   BPH (benign prostatic hyperplasia)    CAD (coronary artery disease)    a.) PCI in ~ 2004 in [place], [place] -> location and stent type unknown   Carotid artery occlusion    COPD (chronic obstructive pulmonary disease) (HCC) 03/25/2014   Diastolic dysfunction 11/02/2020   a.) TTE 11/02/2020: EF 60-65%, G1DD.    Diverticulosis    Erectile dysfunction    a.) on PDE5i (sildenafil)   GERD (gastroesophageal reflux disease) 12/01/2013   Hepatic steatosis    History of kidney stones 03/14/2015   HLD (hyperlipidemia) 12/01/2013   Hypertension    Iliac artery stenosis, bilateral (HCC)    a.) CTA AP 09/23/2022: severe stenosis of the BILATERAL ostial CIAs.   Infrarenal abdominal aortic aneurysm (AAA) without rupture (HCC)    a.) CT renal 01/28/2015: 3.8 cm; b.) lumbar MRI 04/11/2016: 3.7 cm; c.) CT renal 12/24/2017: 3.7 x 3.8 cm; d.) CT AP 08/22/2022: 4.9 cm; e.) CTA AP 09/23/2022: 4.6 cm   T2DM (type 2 diabetes mellitus) (HCC)     Past Surgical History:  Procedure Laterality Date   APPENDECTOMY     CARDIAC CATHETERIZATION     CORONARY ANGIOPLASTY  2004?   [place], [place]   CYSTOSCOPY W/ URETERAL STENT PLACEMENT Left 01/28/2015   Procedure: CYSTOSCOPY WITH RETROGRADE PYELOGRAM/URETERAL STENT PLACEMENT;  Surgeon: Osborn Blaze, MD;  Location: [facility];  Service: Urology;  Laterality: Left;   CYSTOSCOPY/URETEROSCOPY/HOLMIUM LASER/STENT PLACEMENT Left 02/08/2015   Procedure: CYSTOSCOPY/URETEROSCOPY/HOLMIUM LASER/STENT PLACEMENT;  Surgeon: Dustin Gimenez, MD;  Location: [facility];  Service: Urology;  Laterality: Left;   CYSTOSCOPY/URETEROSCOPY/HOLMIUM LASER/STENT PLACEMENT Left 01/07/2018   Procedure: CYSTOSCOPY/URETEROSCOPY/HOLMIUM LASER/STENT PLACEMENT;  Surgeon: Dustin Gimenez, MD;  Location: [facility];  Service: Urology;  Laterality: Left;   CYSTOSCOPY/URETEROSCOPY/HOLMIUM LASER/STENT PLACEMENT Bilateral 09/09/2022   Procedure: CYSTOSCOPY/URETEROSCOPY/HOLMIUM LASER/STENT PLACEMENT/RETROGRADE;  Surgeon: Dustin Gimenez, MD;  Location: [facility];  Service: Urology;  Laterality: Bilateral;   ENDOVASCULAR REPAIR/STENT GRAFT N/A 10/23/2022   Procedure: ENDOVASCULAR REPAIR/STENT GRAFT;  Surgeon: Jackquelyn Mass, MD;  Location: [facility];  Service: Cardiovascular;  Laterality: N/A;   FACIAL COSMETIC  SURGERY     21 fractures in left cheek and face from baseball   LITHOTRIPSY     VASECTOMY      Social History Social History   Tobacco Use   Smoking status: Some Days    Current packs/day: 10.00    Average packs/day: 10.0 packs/day for 0.5 years (5.0 ttl pk-yrs)    Types: Cigarettes    Passive exposure: Current   Smokeless tobacco: Never  Vaping Use   Vaping status: Never Used  Substance Use Topics   Alcohol use: Yes    Comment: occasionally   Drug use: No    Family History Family History  Problem Relation Age of Onset   Diabetes Mother    Diabetes Maternal Uncle    Cancer Maternal Uncle    Cancer Maternal Uncle    Prostate cancer Neg Hx    Bladder Cancer Neg Hx    Kidney cancer Neg Hx     No Known Allergies   REVIEW OF SYSTEMS (Negative unless checked)  Constitutional: [] Weight loss  [] Fever  [] Chills Cardiac: [] Chest pain   [] Chest pressure   [] Palpitations   [] Shortness of breath when laying flat   [] Shortness of breath with exertion. Vascular:  [x] Pain in legs with walking   [] Pain in legs at rest  [] History of DVT   [] Phlebitis   [] Swelling in legs   [] Varicose veins   [] Non-healing ulcers Pulmonary:   [] Uses home oxygen   [] Productive cough   [] Hemoptysis   [] Wheeze  [x] COPD   [] Asthma Neurologic:  [] Dizziness   [] Seizures   [] History of stroke   [] History of TIA  [] Aphasia   [] Vissual changes   [] Weakness or numbness in arm   [] Weakness or numbness in leg Musculoskeletal:   [] Joint swelling   [] Joint pain   [] Low back pain Hematologic:  [] Easy bruising  [] Easy bleeding   [] Hypercoagulable state   [] Anemic Gastrointestinal:  [] Diarrhea   [] Vomiting  [x] Gastroesophageal reflux/heartburn   [] Difficulty swallowing. Genitourinary:  [] Chronic kidney disease   [] Difficult urination  [] Frequent urination   [] Blood in urine Skin:  [] Rashes   [] Ulcers  Psychological:  [] History of anxiety   []  History of major depression.  Physical Examination  There were no vitals  filed for this visit. There is no height or weight on file to calculate BMI. Gen: WD/WN, NAD Head: [place]/AT, No temporalis wasting.  Ear/Nose/Throat: Hearing grossly intact, nares w/o erythema or drainage Eyes: PER, EOMI, sclera nonicteric.  Neck: Supple, no masses.  No bruit or JVD.  Pulmonary:  Good air movement, no audible wheezing, no use of accessory muscles.  Cardiac: RRR, normal S1, S2, no Murmurs. Vascular:  mild trophic changes, no open wounds Vessel Right Left  Radial Palpable Palpable  PT Not Palpable Not Palpable  DP Not Palpable Not Palpable  Gastrointestinal: soft, non-distended. No guarding/no peritoneal signs.  Musculoskeletal: M/S 5/5 throughout.  No visible deformity.  Neurologic: CN 2-12  intact. Pain and light touch intact in extremities.  Symmetrical.  Speech is fluent. Motor exam as listed above. Psychiatric: Judgment intact, Mood & affect appropriate for pt's clinical situation. Dermatologic: No rashes or ulcers noted.  No changes consistent with cellulitis.   CBC Lab Results  Component Value Date   WBC 13.1 (H) 10/24/2022   HGB 12.1 (L) 10/24/2022   HCT 35.9 (L) 10/24/2022   MCV 93.5 10/24/2022   PLT 234 10/24/2022    BMET    Component Value Date/Time   NA 136 10/24/2022 0630   NA 135 (L) 09/25/2012 1155   K 3.7 10/24/2022 0630   K 3.7 09/25/2012 1155   CL 108 10/24/2022 0630   CL 102 09/25/2012 1155   CO2 22 10/24/2022 0630   CO2 26 09/25/2012 1155   GLUCOSE 209 (H) 10/24/2022 0630   GLUCOSE 95 09/25/2012 1155   BUN 13 10/24/2022 0630   BUN 16 09/25/2012 1155   CREATININE 0.86 10/24/2022 0630   CREATININE 0.90 09/25/2012 1155   CALCIUM  8.1 (L) 10/24/2022 0630   CALCIUM  8.8 09/25/2012 1155   GFRNONAA >60 10/24/2022 0630   GFRNONAA >60 09/25/2012 1155   GFRAA >60 05/04/2019 2004   GFRAA >60 09/25/2012 1155   CrCl cannot be calculated (Patient's most recent lab result is older than the maximum 21 days allowed.).  COAG Lab Results  Component  Value Date   INR 1.1 10/23/2022   INR 1.0 11/01/2020    Radiology No results found.   Assessment/Plan 1. Infrarenal abdominal aortic aneurysm (AAA) without rupture (HCC) (Primary) Recommend:  Patient is status post successful endovascular repair of the AAA.    No further intervention is required at this time.   No endoleak is detected and the aneurysm sac is stable.   The patient will continue antiplatelet therapy as prescribed as well as aggressive management of hyperlipidemia. Exercise is again strongly encouraged.    However, endografts require continued surveillance with ultrasound or CT scan. This is mandatory to detect any changes that allow repressurization of the aneurysm sac.  The patient is informed that this would be asymptomatic.   The patient is reminded that lifelong routine surveillance is a necessity with an endograft. Patient will continue to follow-up at the specified interval with ultrasound of the aorta. - VAS US  AORTA/IVC/ILIACS; Future  2. Atherosclerosis of native artery of both lower extremities with intermittent claudication (HCC) The patient continues to have pain postintervention. Based on the description of symptoms and his noninvasive studies today, I suspect this is more so related to sciatic pain and possibly some lower back issues. In regards to his atherosclerotic disease his studies today do not show any significant worsening of peripheral arterial disease. Patient is advised to continue with his antiplatelet therapy. Patient will return in 6 months with noninvasive studies or earlier if issues arise.  - VAS US  ABI WITH/WO TBI; Future  3. Essential hypertension Continue antihypertensive medications as already ordered, these medications have been reviewed and there are no changes at this time.  4. Coronary artery disease involving native coronary artery of native heart with angina pectoris (HCC) Continue cardiac and antihypertensive medications as  already ordered and reviewed, no changes at this time.  Continue statin as ordered and reviewed, no changes at this time  Nitrates PRN for chest pain  5. COPD, mild (HCC) Continue pulmonary medications and aerosols as already ordered, these medications have been reviewed and there are no changes at this time.   6. Type  2 diabetes mellitus with other circulatory complication, unspecified whether long term insulin  use (HCC) Continue hypoglycemic medications as already ordered, these medications have been reviewed and there are no changes at this time.  Hgb A1C to be monitored as already arranged by primary service    Devon Fogo, MD  08/28/2023 12:28 PM

## 2023-08-29 DIAGNOSIS — G8929 Other chronic pain: Secondary | ICD-10-CM | POA: Diagnosis not present

## 2023-08-29 DIAGNOSIS — M25552 Pain in left hip: Secondary | ICD-10-CM | POA: Diagnosis not present

## 2023-08-29 DIAGNOSIS — M5416 Radiculopathy, lumbar region: Secondary | ICD-10-CM | POA: Diagnosis not present

## 2023-08-29 DIAGNOSIS — M5442 Lumbago with sciatica, left side: Secondary | ICD-10-CM | POA: Diagnosis not present

## 2023-09-01 ENCOUNTER — Ambulatory Visit (INDEPENDENT_AMBULATORY_CARE_PROVIDER_SITE_OTHER): Payer: BC Managed Care – PPO

## 2023-09-01 ENCOUNTER — Ambulatory Visit (INDEPENDENT_AMBULATORY_CARE_PROVIDER_SITE_OTHER): Payer: BC Managed Care – PPO | Admitting: Vascular Surgery

## 2023-09-01 ENCOUNTER — Encounter (INDEPENDENT_AMBULATORY_CARE_PROVIDER_SITE_OTHER): Payer: Self-pay | Admitting: Vascular Surgery

## 2023-09-01 VITALS — BP 131/84 | HR 85 | Resp 18 | Ht 69.0 in | Wt 226.2 lb

## 2023-09-01 DIAGNOSIS — I25119 Atherosclerotic heart disease of native coronary artery with unspecified angina pectoris: Secondary | ICD-10-CM | POA: Diagnosis not present

## 2023-09-01 DIAGNOSIS — I7143 Infrarenal abdominal aortic aneurysm, without rupture: Secondary | ICD-10-CM

## 2023-09-01 DIAGNOSIS — I70213 Atherosclerosis of native arteries of extremities with intermittent claudication, bilateral legs: Secondary | ICD-10-CM | POA: Diagnosis not present

## 2023-09-01 DIAGNOSIS — I10 Essential (primary) hypertension: Secondary | ICD-10-CM | POA: Diagnosis not present

## 2023-09-01 DIAGNOSIS — J449 Chronic obstructive pulmonary disease, unspecified: Secondary | ICD-10-CM

## 2023-09-01 DIAGNOSIS — E1159 Type 2 diabetes mellitus with other circulatory complications: Secondary | ICD-10-CM

## 2023-11-12 ENCOUNTER — Encounter: Payer: Self-pay | Admitting: Emergency Medicine

## 2023-11-12 ENCOUNTER — Other Ambulatory Visit: Payer: Self-pay

## 2023-11-12 ENCOUNTER — Emergency Department
Admission: EM | Admit: 2023-11-12 | Discharge: 2023-11-12 | Disposition: A | Discharge status: Home / Self Care | Attending: Emergency Medicine | Admitting: Emergency Medicine

## 2023-11-12 DIAGNOSIS — I110 Hypertensive heart disease with heart failure: Secondary | ICD-10-CM | POA: Insufficient documentation

## 2023-11-12 DIAGNOSIS — J449 Chronic obstructive pulmonary disease, unspecified: Secondary | ICD-10-CM | POA: Diagnosis not present

## 2023-11-12 DIAGNOSIS — I509 Heart failure, unspecified: Secondary | ICD-10-CM | POA: Insufficient documentation

## 2023-11-12 DIAGNOSIS — N200 Calculus of kidney: Secondary | ICD-10-CM | POA: Insufficient documentation

## 2023-11-12 DIAGNOSIS — Z7984 Long term (current) use of oral hypoglycemic drugs: Secondary | ICD-10-CM | POA: Insufficient documentation

## 2023-11-12 DIAGNOSIS — E119 Type 2 diabetes mellitus without complications: Secondary | ICD-10-CM | POA: Diagnosis not present

## 2023-11-12 DIAGNOSIS — Z79899 Other long term (current) drug therapy: Secondary | ICD-10-CM | POA: Insufficient documentation

## 2023-11-12 DIAGNOSIS — Z7982 Long term (current) use of aspirin: Secondary | ICD-10-CM | POA: Insufficient documentation

## 2023-11-12 DIAGNOSIS — M549 Dorsalgia, unspecified: Secondary | ICD-10-CM | POA: Diagnosis present

## 2023-11-12 DIAGNOSIS — I2510 Atherosclerotic heart disease of native coronary artery without angina pectoris: Secondary | ICD-10-CM | POA: Diagnosis not present

## 2023-11-12 LAB — URINALYSIS, W/ REFLEX TO CULTURE (INFECTION SUSPECTED)
Bacteria, UA: NONE SEEN
Bilirubin Urine: NEGATIVE
Glucose, UA: >=500 mg/dL — AB
Hgb urine dipstick: NEGATIVE
Ketones, ur: NEGATIVE mg/dL
Leukocytes,Ua: NEGATIVE
Nitrite: NEGATIVE
Protein, ur: NEGATIVE mg/dL
Specific Gravity, Urine: 1.023 (ref 1.005–1.030)
pH: 5 (ref 5.0–8.0)

## 2023-11-12 LAB — COMPREHENSIVE METABOLIC PANEL WITH GFR
ALT: 35 U/L (ref 0–44)
AST: 28 U/L (ref 15–41)
Albumin: 3.5 g/dL (ref 3.5–5.0)
Alkaline Phosphatase: 40 U/L (ref 38–126)
Anion gap: 9 (ref 5–15)
BUN: 13 mg/dL (ref 8–23)
CO2: 20 mmol/L — ABNORMAL LOW (ref 22–32)
Calcium: 8.9 mg/dL (ref 8.9–10.3)
Chloride: 107 mmol/L (ref 98–111)
Creatinine, Ser: 1.03 mg/dL (ref 0.61–1.24)
GFR, Estimated: >60 mL/min (ref >60)
Glucose, Bld: 232 mg/dL — ABNORMAL HIGH (ref 70–99)
Potassium: 3.3 mmol/L — ABNORMAL LOW (ref 3.5–5.1)
Sodium: 136 mmol/L (ref 135–145)
Total Bilirubin: 0.7 mg/dL (ref 0.0–1.2)
Total Protein: 6.9 g/dL (ref 6.5–8.1)

## 2023-11-12 LAB — CBC WITH DIFFERENTIAL/PLATELET
Abs Immature Granulocytes: 0.09 10*3/uL — ABNORMAL HIGH (ref 0.00–0.07)
Basophils Absolute: 0.1 10*3/uL (ref 0.0–0.1)
Basophils Relative: 1 %
Eosinophils Absolute: 0.3 10*3/uL (ref 0.0–0.5)
Eosinophils Relative: 3 %
HCT: 46.6 % (ref 39.0–52.0)
Hemoglobin: 16 g/dL (ref 13.0–17.0)
Immature Granulocytes: 1 %
Lymphocytes Relative: 32 %
Lymphs Abs: 3.6 10*3/uL (ref 0.7–4.0)
MCH: 33.1 pg (ref 26.0–34.0)
MCHC: 34.3 g/dL (ref 30.0–36.0)
MCV: 96.3 fL (ref 80.0–100.0)
Monocytes Absolute: 0.7 10*3/uL (ref 0.1–1.0)
Monocytes Relative: 6 %
Neutro Abs: 6.5 10*3/uL (ref 1.7–7.7)
Neutrophils Relative %: 57 %
Platelets: 296 10*3/uL (ref 150–400)
RBC: 4.84 MIL/uL (ref 4.22–5.81)
RDW: 14 % (ref 11.5–15.5)
WBC: 11.3 10*3/uL — ABNORMAL HIGH (ref 4.0–10.5)
nRBC: 0.2 % (ref 0.0–0.2)

## 2023-11-12 MED ORDER — KETOROLAC TROMETHAMINE 30 MG/ML IJ SOLN
30.0000 mg | Freq: Once | INTRAMUSCULAR | Status: AC
  Administered 2023-11-12: 30 mg via INTRAVENOUS
  Filled 2023-11-12: qty 1

## 2023-11-12 MED ORDER — IBUPROFEN 800 MG PO TABS: 800.0000 mg | ORAL_TABLET | Freq: Once | ORAL | Status: DC

## 2023-11-12 NOTE — ED Provider Notes (Signed)
 [facility] Provider Note    Event Date/Time   First MD Initiated Contact with Patient 11/12/23 [phone]     (approximate)   History   Nephrolithiasis   HPI  [name] is a 63 y.o. male with history of CAD, COPD, CHF, hypertension, diabetes, hyperlipidemia, infrarenal abdominal aortic aneurysm status post stent graft placement in April 2024 who presents to the emergency department for evaluation after he passed a kidney stone at work.  States he has had some soreness to the left side of his back but states this is not abnormal for him as he has chronic back pain.  He reports he has had history of kidney stones requiring surgical intervention and is followed by Dr. Ace Holder with urology.  States he wanted to be "checked out".  He denies any fevers, vomiting or diarrhea, abdominal pain, dysuria or hematuria.  She states he is "sore".  States this feels like normal discomfort for him and he is not concerned that this is his aneurysm.   History provided by patient.    Past Medical History:  Diagnosis Date   Aortic atherosclerosis (HCC)    Arthritis    Benign fibroma of prostate 12/01/2013   BPH (benign prostatic hyperplasia)    CAD (coronary artery disease)    a.) PCI in ~ 2004 in [place], [place] -> location and stent type unknown   Carotid artery occlusion    COPD (chronic obstructive pulmonary disease) (HCC) 03/25/2014   Diastolic dysfunction 11/02/2020   a.) TTE 11/02/2020: EF 60-65%, G1DD.   Diverticulosis    Erectile dysfunction    a.) on PDE5i (sildenafil)   GERD (gastroesophageal reflux disease) 12/01/2013   Hepatic steatosis    History of kidney stones 03/14/2015   HLD (hyperlipidemia) 12/01/2013   Hypertension    Iliac artery stenosis, bilateral (HCC)    a.) CTA AP 09/23/2022: severe stenosis of the BILATERAL ostial CIAs.   Infrarenal abdominal aortic aneurysm (AAA) without rupture (HCC)    a.) CT renal 01/28/2015: 3.8 cm; b.) lumbar MRI  04/11/2016: 3.7 cm; c.) CT renal 12/24/2017: 3.7 x 3.8 cm; d.) CT AP 08/22/2022: 4.9 cm; e.) CTA AP 09/23/2022: 4.6 cm   T2DM (type 2 diabetes mellitus) (HCC)     Past Surgical History:  Procedure Laterality Date   APPENDECTOMY     CARDIAC CATHETERIZATION     CORONARY ANGIOPLASTY  2004?   [place], [place]   CYSTOSCOPY W/ URETERAL STENT PLACEMENT Left 01/28/2015   Procedure: CYSTOSCOPY WITH RETROGRADE PYELOGRAM/URETERAL STENT PLACEMENT;  Surgeon: Osborn Blaze, MD;  Location: [facility];  Service: Urology;  Laterality: Left;   CYSTOSCOPY/URETEROSCOPY/HOLMIUM LASER/STENT PLACEMENT Left 02/08/2015   Procedure: CYSTOSCOPY/URETEROSCOPY/HOLMIUM LASER/STENT PLACEMENT;  Surgeon: Dustin Gimenez, MD;  Location: [facility];  Service: Urology;  Laterality: Left;   CYSTOSCOPY/URETEROSCOPY/HOLMIUM LASER/STENT PLACEMENT Left 01/07/2018   Procedure: CYSTOSCOPY/URETEROSCOPY/HOLMIUM LASER/STENT PLACEMENT;  Surgeon: Dustin Gimenez, MD;  Location: [facility];  Service: Urology;  Laterality: Left;   CYSTOSCOPY/URETEROSCOPY/HOLMIUM LASER/STENT PLACEMENT Bilateral 09/09/2022   Procedure: CYSTOSCOPY/URETEROSCOPY/HOLMIUM LASER/STENT PLACEMENT/RETROGRADE;  Surgeon: Dustin Gimenez, MD;  Location: [facility];  Service: Urology;  Laterality: Bilateral;   ENDOVASCULAR REPAIR/STENT GRAFT N/A 10/23/2022   Procedure: ENDOVASCULAR REPAIR/STENT GRAFT;  Surgeon: Jackquelyn Mass, MD;  Location: [facility];  Service: Cardiovascular;  Laterality: N/A;   FACIAL COSMETIC SURGERY     21 fractures in left cheek and face from baseball   LITHOTRIPSY     VASECTOMY      MEDICATIONS:  Prior to  Admission medications   Medication Sig Start Date End Date Taking? Authorizing Provider  albuterol  (VENTOLIN  HFA) 108 (90 Base) MCG/ACT inhaler Inhale 2 puffs into the lungs every 6 (six) hours as needed for wheezing or shortness of breath. 08/27/19   Shann Darnel Rufino Coulter, PA-C  aspirin  EC 81 MG tablet Take 1 tablet (81 mg total) by mouth daily. 10/24/22    Pace, Valentin Gaskins, NP  atorvastatin  (LIPITOR) 40 MG tablet Take 40 mg by mouth daily. 02/03/18   [provider]  clopidogrel  (PLAVIX ) 75 MG tablet Take 1 tablet (75 mg total) by mouth daily. 10/24/22   Pace, Valentin Gaskins, NP  esomeprazole (NEXIUM) 20 MG capsule Take 20 mg by mouth every morning.    [provider]  fenofibrate (TRICOR) 145 MG tablet Take 145 mg by mouth every morning.  01/17/15   [provider]  gabapentin  (NEURONTIN ) 300 MG capsule Take 600 mg by mouth 2 (two) times daily.    [provider]  JANUVIA 50 MG tablet Take 50 mg by mouth daily. 04/23/23   [provider]  lisinopril  (PRINIVIL ,ZESTRIL ) 10 MG tablet Take 10 mg by mouth every morning.     [provider]  meloxicam (MOBIC) 15 MG tablet Take 15 mg by mouth daily. 09/18/22   [provider]  metFORMIN (GLUCOPHAGE) 500 MG tablet Take 500 mg by mouth 2 (two) times daily with a meal. 12/03/17   [provider]  methocarbamol (ROBAXIN) 500 MG tablet Take 500 mg by mouth at bedtime as needed.    [provider]  Multiple Vitamin (MULTI-VITAMINS) TABS Take 1 tablet by mouth every morning.     [provider]  nicotine (NICODERM CQ - DOSED IN MG/24 HR) 7 mg/24hr patch Place 7 mg onto the skin daily. 09/04/22   [provider]  oxyCODONE -acetaminophen  (PERCOCET/ROXICET) 5-325 MG tablet Take 1-2 tablets by mouth every 4 (four) hours as needed for moderate pain. 10/24/22   Pace, Valentin Gaskins, NP  pioglitazone (ACTOS) 15 MG tablet Take 15 mg by mouth daily. 10/31/20   [provider]  saxagliptin HCl (ONGLYZA) 2.5 MG TABS tablet Take 2.5 mg by mouth daily.    [provider]  sildenafil (REVATIO) 20 MG tablet Take 20-60 mg by mouth daily as needed. 10/21/20   [provider]    Physical Exam   Triage Vital Signs: ED Triage Vitals  Encounter Vitals Group     BP 11/12/23 0052 (!) 140/92     Systolic BP Percentile --       Diastolic BP Percentile --      Pulse Rate 11/12/23 0052 (!) 111     Resp 11/12/23 0052 18     Temp 11/12/23 0052 97.8 F (36.6 C)     Temp Source 11/12/23 0052 Oral     SpO2 11/12/23 0052 97 %     Weight 11/12/23 0051 225 lb (102.1 kg)     Height 11/12/23 0051 5\' 9"  (1.753 m)     Head Circumference --      Peak Flow --      Pain Score 11/12/23 0051 0     Pain Loc --      Pain Education --      Exclude from Growth Chart --     Most recent vital signs: Vitals:   11/12/23 0150 11/12/23 0200  BP: 130/83 122/81  Pulse: 98 99  Resp:    Temp:    SpO2: 93% 94%  CONSTITUTIONAL: Alert, responds appropriately to questions.  In no distress.   HEAD: Normocephalic, atraumatic EYES: Conjunctivae clear, pupils appear equal, sclera nonicteric ENT: normal nose; moist mucous membranes NECK: Supple, normal ROM CARD: RRR; S1 and S2 appreciated RESP: Normal chest excursion without splinting or tachypnea; breath sounds clear and equal bilaterally; no wheezes, no rhonchi, no rales, no hypoxia or respiratory distress, speaking full sentences ABD/GI: Non-distended; soft, non-tender, no rebound, no guarding, no peritoneal signs BACK: The back appears normal, no midline spinal tenderness, step-off or deformity EXT: Normal ROM in all joints; no deformity noted, no edema SKIN: Normal color for age and race; warm; no rash on exposed skin NEURO: Moves all extremities equally, normal speech, normal gait PSYCH: The patient's mood and manner are appropriate.   ED Results / Procedures / Treatments   LABS: (all labs ordered are listed, but only abnormal results are displayed) Labs Reviewed  URINALYSIS, W/ REFLEX TO CULTURE (INFECTION SUSPECTED) - Abnormal; Notable for the following components:      Result Value   Color, Urine YELLOW (*)    APPearance CLEAR (*)    Glucose, UA >=500 (*)    All other components within normal limits  CBC WITH DIFFERENTIAL/PLATELET - Abnormal; Notable for the following  components:   WBC 11.3 (*)    Abs Immature Granulocytes 0.09 (*)    All other components within normal limits  COMPREHENSIVE METABOLIC PANEL WITH GFR - Abnormal; Notable for the following components:   Potassium 3.3 (*)    CO2 20 (*)    Glucose, Bld 232 (*)    All other components within normal limits     EKG:   RADIOLOGY: My personal review and interpretation of imaging:    I have personally reviewed all radiology reports.   No results found.   PROCEDURES:  Critical Care performed: No     Procedures    IMPRESSION / MDM / ASSESSMENT AND PLAN / ED COURSE  I reviewed the triage vital signs and the nursing notes.    Patient here after he states he passed a kidney stone at work.  Reports having some mild soreness in his back but no abdominal pain, fevers, vomiting, dysuria.    DIFFERENTIAL DIAGNOSIS (includes but not limited to):   Kidney stone, UTI, doubt ruptured aneurysm, dissection, appendicitis, colitis, bowel obstruction   Patient's presentation is most consistent with acute presentation with potential threat to life or bodily function.   PLAN: Will check labs, urine today.  Patient states he just wanted to be evaluated because normally he has to have surgical intervention for his kidney stones so he thought it was abnormal to see that he had passed one spontaneously.  States he is having some soreness in his back but reports history of chronic back pain and that this is not significantly different than prior.  Abdominal exam is benign.  No fevers.  No vomiting.  Will give Toradol  for his discomfort and reassess.   MEDICATIONS GIVEN IN ED: Medications  ketorolac  (TORADOL ) 30 MG/ML injection 30 mg (30 mg Intravenous Given 11/12/23 0147)     ED COURSE: Labs unremarkable.  Normal electrolytes, creatinine.  Urine shows no blood or sign of infection.  Resting comfortably here, tolerating p.o.  I feel he is safe for discharge.  Discussed return precautions.   At  this time, I do not feel there is any life-threatening condition present. I reviewed all nursing notes, vitals, pertinent previous records.  All lab and urine results, EKGs, imaging  ordered have been independently reviewed and interpreted by myself.  I reviewed all available radiology reports from any imaging ordered this visit.  Based on my assessment, I feel the patient is safe to be discharged home without further emergent workup and can continue workup as an outpatient as needed. Discussed all findings, treatment plan as well as usual and customary return precautions.  They verbalize understanding and are comfortable with this plan.  Outpatient follow-up has been provided as needed.  All questions have been answered.    CONSULTS:  none   OUTSIDE RECORDS REVIEWED: Reviewed last PMR note on 10/17/2023 and last PCP note on 10/09/2023.       FINAL CLINICAL IMPRESSION(S) / ED DIAGNOSES   Final diagnoses:  Kidney stone     Rx / DC Orders   ED Discharge Orders     None        Note:  This document was prepared using Dragon voice recognition software and may include unintentional dictation errors.   Chandelle Harkey, Clover Dao, DO 11/12/23 [phone]

## 2023-11-12 NOTE — ED Triage Notes (Signed)
 Patient ambulatory to triage with steady gait, without difficulty or distress noted; pt reports that he passed a kidney stone tonight; denies any c/o but just wants to be evaluated

## 2023-12-09 ENCOUNTER — Encounter (INDEPENDENT_AMBULATORY_CARE_PROVIDER_SITE_OTHER): Payer: Self-pay

## 2023-12-19 ENCOUNTER — Other Ambulatory Visit: Payer: Self-pay | Admitting: Surgery

## 2023-12-19 DIAGNOSIS — M25551 Pain in right hip: Secondary | ICD-10-CM

## 2023-12-28 ENCOUNTER — Ambulatory Visit
Admission: RE | Admit: 2023-12-28 | Discharge: 2023-12-28 | Disposition: A | Discharge status: Home / Self Care | Source: Ambulatory Visit | Attending: Surgery | Admitting: Surgery

## 2023-12-28 DIAGNOSIS — M25551 Pain in right hip: Secondary | ICD-10-CM

## 2023-12-28 DIAGNOSIS — M25552 Pain in left hip: Secondary | ICD-10-CM | POA: Diagnosis present

## 2024-02-27 ENCOUNTER — Other Ambulatory Visit (INDEPENDENT_AMBULATORY_CARE_PROVIDER_SITE_OTHER): Payer: Self-pay | Admitting: Vascular Surgery

## 2024-02-27 DIAGNOSIS — I70213 Atherosclerosis of native arteries of extremities with intermittent claudication, bilateral legs: Secondary | ICD-10-CM

## 2024-02-27 DIAGNOSIS — I714 Abdominal aortic aneurysm, without rupture, unspecified: Secondary | ICD-10-CM

## 2024-02-29 NOTE — Progress Notes (Unsigned)
 MRN : [id number]  [name] is a 63 y.o. (01/12/1961) male who presents with chief complaint of check circulation.  History of Present Illness:   The patient returns to the office for surveillance of an abdominal aortic aneurysm status post stent graft placement on 10/23/2022.    Procedure: Placement of a C3 26 x 14 x 14 Gore Excluder Endoprosthesis main body with a 12 x 12 contralateral iliac limb   Patient denies abdominal pain or back pain, no other abdominal complaints.  No symptoms consistent with distal embolization No changes in claudication distance or new rest pain symptoms. No interval development of new ulcers or wounds   There have been no significant interval changes in his overall healthcare since his last visit.    Patient denies amaurosis fugax or TIA symptoms.  The patient denies recent episodes of angina or shortness of breath.    Duplex US  of the aorta and iliac arteries shows a 4.18 cm AAA sac with no endoleak, slight decrease in the sac compared to the previous study.   Previous CT angiography of the abdomen and pelvis dated 09/23/2022 shows an infrarenal AAA 4.9 cm.  There is some mural thrombus noted in the visceral segment.  There is greater than 80% bilateral common iliac artery stenosis at the level of the aortic bifurcation.   No outpatient medications have been marked as taking for the 03/01/24 encounter (Appointment) with Jama, Cordella MATSU, MD.    Past Medical History:  Diagnosis Date   Aortic atherosclerosis [facility])    Arthritis    Benign fibroma of prostate 12/01/2013   BPH (benign prostatic hyperplasia)    CAD (coronary artery disease)    a.) PCI in ~ 2004 in [place], [place] -> location and stent type unknown   Carotid artery occlusion    COPD (chronic obstructive pulmonary disease) (HCC) 03/25/2014   Diastolic dysfunction 11/02/2020   a.) TTE 11/02/2020: EF 60-65%, G1DD.    Diverticulosis    Erectile dysfunction    a.) on PDE5i (sildenafil)   GERD (gastroesophageal reflux disease) 12/01/2013   Hepatic steatosis    History of kidney stones 03/14/2015   HLD (hyperlipidemia) 12/01/2013   Hypertension    Iliac artery stenosis, bilateral (HCC)    a.) CTA AP 09/23/2022: severe stenosis of the BILATERAL ostial CIAs.   Infrarenal abdominal aortic aneurysm (AAA) without rupture (HCC)    a.) CT renal 01/28/2015: 3.8 cm; b.) lumbar MRI 04/11/2016: 3.7 cm; c.) CT renal 12/24/2017: 3.7 x 3.8 cm; d.) CT AP 08/22/2022: 4.9 cm; e.) CTA AP 09/23/2022: 4.6 cm   T2DM (type 2 diabetes mellitus) (HCC)     Past Surgical History:  Procedure Laterality Date   APPENDECTOMY     CARDIAC CATHETERIZATION     CORONARY ANGIOPLASTY  2004?   [place], [place]   CYSTOSCOPY W/ URETERAL STENT PLACEMENT Left 01/28/2015   Procedure: CYSTOSCOPY WITH RETROGRADE PYELOGRAM/URETERAL STENT PLACEMENT;  Surgeon: Ricardo Likens, MD;  Location: [facility];  Service: Urology;  Laterality: Left;   CYSTOSCOPY/URETEROSCOPY/HOLMIUM LASER/STENT PLACEMENT Left 02/08/2015   Procedure: CYSTOSCOPY/URETEROSCOPY/HOLMIUM LASER/STENT PLACEMENT;  Surgeon: Rosina  Penne, MD;  Location: [facility];  Service: Urology;  Laterality: Left;   CYSTOSCOPY/URETEROSCOPY/HOLMIUM LASER/STENT PLACEMENT Left 01/07/2018   Procedure: CYSTOSCOPY/URETEROSCOPY/HOLMIUM LASER/STENT PLACEMENT;  Surgeon: Penne Knee, MD;  Location: [facility];  Service: Urology;  Laterality: Left;   CYSTOSCOPY/URETEROSCOPY/HOLMIUM LASER/STENT PLACEMENT Bilateral 09/09/2022   Procedure: CYSTOSCOPY/URETEROSCOPY/HOLMIUM LASER/STENT PLACEMENT/RETROGRADE;  Surgeon: Penne Knee, MD;  Location: [facility];  Service: Urology;  Laterality: Bilateral;   ENDOVASCULAR REPAIR/STENT GRAFT N/A 10/23/2022   Procedure: ENDOVASCULAR REPAIR/STENT GRAFT;  Surgeon: Jama Cordella MATSU, MD;  Location: [facility];  Service: Cardiovascular;  Laterality: N/A;   FACIAL COSMETIC  SURGERY     21 fractures in left cheek and face from baseball   LITHOTRIPSY     VASECTOMY      Social History Social History   Tobacco Use   Smoking status: Some Days    Current packs/day: 10.00    Average packs/day: 10.0 packs/day for 0.5 years (5.0 ttl pk-yrs)    Types: Cigarettes    Passive exposure: Current   Smokeless tobacco: Never  Vaping Use   Vaping status: Never Used  Substance Use Topics   Alcohol use: Yes    Comment: occasionally   Drug use: No    Family History Family History  Problem Relation Age of Onset   Diabetes Mother    Diabetes Maternal Uncle    Cancer Maternal Uncle    Cancer Maternal Uncle    Prostate cancer Neg Hx    Bladder Cancer Neg Hx    Kidney cancer Neg Hx     No Known Allergies   REVIEW OF SYSTEMS (Negative unless checked)  Constitutional: [] Weight loss  [] Fever  [] Chills Cardiac: [] Chest pain   [] Chest pressure   [] Palpitations   [] Shortness of breath when laying flat   [] Shortness of breath with exertion. Vascular:  [x] Pain in legs with walking   [] Pain in legs at rest  [] History of DVT   [] Phlebitis   [] Swelling in legs   [] Varicose veins   [] Non-healing ulcers Pulmonary:   [] Uses home oxygen   [] Productive cough   [] Hemoptysis   [] Wheeze  [] COPD   [] Asthma Neurologic:  [] Dizziness   [] Seizures   [] History of stroke   [] History of TIA  [] Aphasia   [] Vissual changes   [] Weakness or numbness in arm   [] Weakness or numbness in leg Musculoskeletal:   [] Joint swelling   [] Joint pain   [] Low back pain Hematologic:  [] Easy bruising  [] Easy bleeding   [] Hypercoagulable state   [] Anemic Gastrointestinal:  [] Diarrhea   [] Vomiting  [] Gastroesophageal reflux/heartburn   [] Difficulty swallowing. Genitourinary:  [] Chronic kidney disease   [] Difficult urination  [] Frequent urination   [] Blood in urine Skin:  [] Rashes   [] Ulcers  Psychological:  [] History of anxiety   []  History of major depression.  Physical Examination  There were no vitals  filed for this visit. There is no height or weight on file to calculate BMI. Gen: WD/WN, NAD Head: [place]/AT, No temporalis wasting.  Ear/Nose/Throat: Hearing grossly intact, nares w/o erythema or drainage Eyes: PER, EOMI, sclera nonicteric.  Neck: Supple, no masses.  No bruit or JVD.  Pulmonary:  Good air movement, no audible wheezing, no use of accessory muscles.  Cardiac: RRR, normal S1, S2, no Murmurs. Vascular:  mild trophic changes, no open wounds Vessel Right Left  Radial Palpable Palpable  PT Not Palpable Not Palpable  DP Not Palpable Not Palpable  Gastrointestinal: soft, non-distended. No guarding/no peritoneal signs.  Musculoskeletal: M/S 5/5 throughout.  No visible deformity.  Neurologic: CN 2-12 intact. Pain and light touch intact in extremities.  Symmetrical.  Speech is fluent. Motor exam as listed above. Psychiatric: Judgment intact, Mood & affect appropriate for pt's clinical situation. Dermatologic: No rashes or ulcers noted.  No changes consistent with cellulitis.   CBC Lab Results  Component Value Date   WBC 11.3 (H) 11/12/2023   HGB 16.0 11/12/2023   HCT 46.6 11/12/2023   MCV 96.3 11/12/2023   PLT 296 11/12/2023    BMET    Component Value Date/Time   NA 136 11/12/2023 0130   NA 135 (L) 09/25/2012 1155   K 3.3 (L) 11/12/2023 0130   K 3.7 09/25/2012 1155   CL 107 11/12/2023 0130   CL 102 09/25/2012 1155   CO2 20 (L) 11/12/2023 0130   CO2 26 09/25/2012 1155   GLUCOSE 232 (H) 11/12/2023 0130   GLUCOSE 95 09/25/2012 1155   BUN 13 11/12/2023 0130   BUN 16 09/25/2012 1155   CREATININE 1.03 11/12/2023 0130   CREATININE 0.90 09/25/2012 1155   CALCIUM  8.9 11/12/2023 0130   CALCIUM  8.8 09/25/2012 1155   GFRNONAA >60 11/12/2023 0130   GFRNONAA >60 09/25/2012 1155   GFRAA >60 05/04/2019 2004   GFRAA >60 09/25/2012 1155   CrCl cannot be calculated (Patient's most recent lab result is older than the maximum 21 days allowed.).  COAG Lab Results  Component  Value Date   INR 1.1 10/23/2022   INR 1.0 11/01/2020    Radiology No results found.   Assessment/Plan There are no diagnoses linked to this encounter.   Cordella Shawl, MD  02/29/2024 3:45 PM

## 2024-03-01 ENCOUNTER — Encounter (INDEPENDENT_AMBULATORY_CARE_PROVIDER_SITE_OTHER): Payer: Self-pay | Admitting: Vascular Surgery

## 2024-03-01 ENCOUNTER — Ambulatory Visit (INDEPENDENT_AMBULATORY_CARE_PROVIDER_SITE_OTHER): Payer: BC Managed Care – PPO

## 2024-03-01 ENCOUNTER — Ambulatory Visit (INDEPENDENT_AMBULATORY_CARE_PROVIDER_SITE_OTHER): Payer: BC Managed Care – PPO | Admitting: Vascular Surgery

## 2024-03-01 VITALS — BP 149/96 | HR 87 | Resp 18 | Wt 227.8 lb

## 2024-03-01 DIAGNOSIS — I70213 Atherosclerosis of native arteries of extremities with intermittent claudication, bilateral legs: Secondary | ICD-10-CM

## 2024-03-01 DIAGNOSIS — I7143 Infrarenal abdominal aortic aneurysm, without rupture: Secondary | ICD-10-CM | POA: Diagnosis not present

## 2024-03-01 DIAGNOSIS — I10 Essential (primary) hypertension: Secondary | ICD-10-CM

## 2024-03-01 DIAGNOSIS — I714 Abdominal aortic aneurysm, without rupture, unspecified: Secondary | ICD-10-CM | POA: Diagnosis not present

## 2024-03-01 DIAGNOSIS — I25119 Atherosclerotic heart disease of native coronary artery with unspecified angina pectoris: Secondary | ICD-10-CM

## 2024-03-01 DIAGNOSIS — E782 Mixed hyperlipidemia: Secondary | ICD-10-CM

## 2024-03-01 LAB — VAS US ABI WITH/WO TBI
Left ABI: 1.12
Right ABI: 1.07

## 2024-07-29 ENCOUNTER — Ambulatory Visit
Admission: RE | Admit: 2024-07-29 | Discharge: 2024-07-29 | Disposition: A | Discharge status: Home / Self Care | Payer: Self-pay | Attending: Family Medicine

## 2024-07-29 VITALS — BP 130/80 | HR 122 | Temp 98.3°F | Resp 16 | Wt 230.6 lb

## 2024-07-29 DIAGNOSIS — Z20828 Contact with and (suspected) exposure to other viral communicable diseases: Secondary | ICD-10-CM | POA: Diagnosis not present

## 2024-07-29 DIAGNOSIS — J449 Chronic obstructive pulmonary disease, unspecified: Secondary | ICD-10-CM

## 2024-07-29 DIAGNOSIS — J111 Influenza due to unidentified influenza virus with other respiratory manifestations: Secondary | ICD-10-CM

## 2024-07-29 MED ORDER — OSELTAMIVIR PHOSPHATE 75 MG PO CAPS: 75.0000 mg | ORAL_CAPSULE | Freq: Two times a day (BID) | ORAL | 0 refills | Status: AC

## 2024-07-29 MED ORDER — ALBUTEROL SULFATE HFA 108 (90 BASE) MCG/ACT IN AERS: 1.0000 | INHALATION_SPRAY | Freq: Four times a day (QID) | RESPIRATORY_TRACT | 0 refills | Status: AC | PRN

## 2024-07-29 MED ORDER — PREDNISONE 20 MG PO TABS: 40.0000 mg | ORAL_TABLET | Freq: Every day | ORAL | 0 refills | Status: AC

## 2024-07-29 NOTE — ED Triage Notes (Signed)
 Pt c/o possible Flu A x2days  Pt states that he was around his roommate who tested positive for Flu A  Pt states that he has cough, eye drainage, chest congestion, diarrhea, and SOB.  Pt states that he has COPD and Diabetes.

## 2024-07-29 NOTE — Discharge Instructions (Signed)
 Start Tamiflu  antiviral medication given your symptoms and close exposure to the flu.  I have refilled your albuterol  inhaler to use as needed for wheezing or shortness of breath.  Start prednisone  daily for 5 days for your COPD symptoms.  Lots of rest and fluids.  Follow-up with your PCP in 2 days for recheck.  Please go to the emergency room for any worsening symptoms.  I hope you feel better soon!

## 2024-07-29 NOTE — ED Provider Notes (Signed)
 " [facility]    CSN: [id number] Arrival date & time: 07/29/24  1252      History   Chief Complaint Chief Complaint  Patient presents with   Influenza    HPI [name] is a 64 y.o. male  presents for evaluation of URI symptoms for 2 days. Patient reports associated symptoms of cough, congestion, diarrhea, watery eyes, sore throat, shortness of breath, body aches. Denies vomiting, fevers.  Patient has a history of COPD and denies increased in sputum production but does have shortness of breath.  He is out of his albuterol  inhaler.  Reports he has remained testing positive for flu A.  Pt has taken cough drops OTC for symptoms. Pt has no other concerns at this time.    Influenza Presenting symptoms: cough, diarrhea, myalgias, shortness of breath and sore throat   Associated symptoms: nasal congestion     Past Medical History:  Diagnosis Date   Aortic atherosclerosis    Arthritis    Benign fibroma of prostate 12/01/2013   BPH (benign prostatic hyperplasia)    CAD (coronary artery disease)    a.) PCI in ~ 2004 in [place], [place] -> location and stent type unknown   Carotid artery occlusion    COPD (chronic obstructive pulmonary disease) (HCC) 03/25/2014   Diastolic dysfunction 11/02/2020   a.) TTE 11/02/2020: EF 60-65%, G1DD.   Diverticulosis    Erectile dysfunction    a.) on PDE5i (sildenafil)   GERD (gastroesophageal reflux disease) 12/01/2013   Hepatic steatosis    History of kidney stones 03/14/2015   HLD (hyperlipidemia) 12/01/2013   Hypertension    Iliac artery stenosis, bilateral    a.) CTA AP 09/23/2022: severe stenosis of the BILATERAL ostial CIAs.   Infrarenal abdominal aortic aneurysm (AAA) without rupture    a.) CT renal 01/28/2015: 3.8 cm; b.) lumbar MRI 04/11/2016: 3.7 cm; c.) CT renal 12/24/2017: 3.7 x 3.8 cm; d.) CT AP 08/22/2022: 4.9 cm; e.) CTA AP 09/23/2022: 4.6 cm   T2DM (type 2 diabetes mellitus) (HCC)     Patient Active Problem  List   Diagnosis Date Noted   Essential hypertension 08/28/2023   Abdominal aortic aneurysm (AAA) 10/23/2022   CAD (coronary artery disease) 09/26/2022   Atherosclerosis of native arteries of extremity with intermittent claudication 09/24/2022   Chest pain 11/01/2020   Type 2 diabetes mellitus (HCC) 11/01/2020   Hyperlipidemia associated with type 2 diabetes mellitus (HCC) 11/01/2020   AAA (abdominal aortic aneurysm) without rupture 05/13/2016   PVD (peripheral vascular disease) 04/10/2016   Hypertension associated with diabetes (HCC) 03/14/2015   Calculus of kidney 03/14/2015   COPD, mild (HCC) 03/25/2014   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   [place], [place]   CYSTOSCOPY W/ URETERAL STENT PLACEMENT Left 01/28/2015   Procedure: CYSTOSCOPY WITH RETROGRADE PYELOGRAM/URETERAL STENT PLACEMENT;  Surgeon: Ricardo Likens, MD;  Location: [facility];  Service: Urology;  Laterality: Left;   CYSTOSCOPY/URETEROSCOPY/HOLMIUM LASER/STENT PLACEMENT Left 02/08/2015   Procedure: CYSTOSCOPY/URETEROSCOPY/HOLMIUM LASER/STENT PLACEMENT;  Surgeon: Rosina Riis, MD;  Location: [facility];  Service: Urology;  Laterality: Left;   CYSTOSCOPY/URETEROSCOPY/HOLMIUM LASER/STENT PLACEMENT Left 01/07/2018   Procedure: CYSTOSCOPY/URETEROSCOPY/HOLMIUM LASER/STENT PLACEMENT;  Surgeon: Riis Rosina, MD;  Location: [facility];  Service: Urology;  Laterality: Left;   CYSTOSCOPY/URETEROSCOPY/HOLMIUM LASER/STENT PLACEMENT Bilateral 09/09/2022  Procedure: CYSTOSCOPY/URETEROSCOPY/HOLMIUM LASER/STENT PLACEMENT/RETROGRADE;  Surgeon: Penne Knee, MD;  Location: [facility];  Service: Urology;  Laterality: Bilateral;   ENDOVASCULAR STENT GRAFT (AAA) N/A 10/23/2022   Procedure: ENDOVASCULAR REPAIR/STENT GRAFT;  Surgeon: Jama Cordella MATSU, MD;   Location: [facility];  Service: Cardiovascular;  Laterality: N/A;   FACIAL COSMETIC SURGERY     21 fractures in left cheek and face from baseball   LITHOTRIPSY     VASECTOMY         Home Medications    Prior to Admission medications  Medication Sig Start Date End Date Taking? Authorizing Provider  albuterol  (VENTOLIN  HFA) 108 (90 Base) MCG/ACT inhaler Inhale 1-2 puffs into the lungs every 6 (six) hours as needed for wheezing or shortness of breath. 07/29/24  Yes Loreda Myla SAUNDERS, NP  aspirin  EC 81 MG tablet Take 1 tablet (81 mg total) by mouth daily. 10/24/22  Yes Pace, Brien R, NP  atorvastatin  (LIPITOR) 40 MG tablet Take 40 mg by mouth daily. 02/03/18  Yes [provider]  esomeprazole (NEXIUM) 20 MG capsule Take 20 mg by mouth every morning.   Yes [provider]  fenofibrate (TRICOR) 145 MG tablet Take 145 mg by mouth every morning.  01/17/15  Yes [provider]  gabapentin  (NEURONTIN ) 300 MG capsule Take 600 mg by mouth 2 (two) times daily.   Yes [provider]  JANUVIA 50 MG tablet Take 50 mg by mouth daily. 04/23/23  Yes [provider]  lisinopril  (PRINIVIL ,ZESTRIL ) 10 MG tablet Take 10 mg by mouth every morning.    Yes [provider]  meloxicam (MOBIC) 15 MG tablet Take 15 mg by mouth daily. 09/18/22  Yes [provider]  metFORMIN (GLUCOPHAGE) 500 MG tablet Take 500 mg by mouth 2 (two) times daily with a meal. 12/03/17  Yes [provider]  methocarbamol (ROBAXIN) 500 MG tablet Take 500 mg by mouth at bedtime as needed.   Yes [provider]  Multiple Vitamin (MULTI-VITAMINS) TABS Take 1 tablet by mouth every morning.    Yes [provider]  nicotine (NICODERM CQ - DOSED IN MG/24 HR) 7 mg/24hr patch Place 7 mg onto the skin daily. 09/04/22  Yes [provider]  oseltamivir  (TAMIFLU ) 75 MG capsule Take 1 capsule (75 mg total) by mouth every 12 (twelve) hours. 07/29/24  Yes Sonika Levins, Jodi  R, NP  oxyCODONE -acetaminophen  (PERCOCET/ROXICET) 5-325 MG tablet Take 1-2 tablets by mouth every 4 (four) hours as needed for moderate pain. 10/24/22  Yes Pace, Brien R, NP  pioglitazone (ACTOS) 15 MG tablet Take 15 mg by mouth daily. 10/31/20  Yes [provider]  predniSONE  (DELTASONE ) 20 MG tablet Take 2 tablets (40 mg total) by mouth daily with breakfast for 5 days. 07/29/24 08/03/24 Yes Ayo Guarino, Jodi R, NP  saxagliptin HCl (ONGLYZA) 2.5 MG TABS tablet Take 2.5 mg by mouth daily.   Yes [provider]  sildenafil (REVATIO) 20 MG tablet Take 20-60 mg by mouth daily as needed. 10/21/20  Yes [provider]  clopidogrel  (PLAVIX ) 75 MG tablet Take 1 tablet (75 mg total) by mouth daily. 10/24/22   Elisabeth Gwendlyn SAUNDERS, NP    Family History Family History  Problem Relation Age of Onset   Diabetes Mother    Diabetes Maternal Uncle    Cancer Maternal Uncle    Cancer Maternal Uncle    Prostate cancer Neg Hx    Bladder Cancer Neg Hx    Kidney cancer Neg Hx  Social History Social History[1]   Allergies   Patient has no known allergies.   Review of Systems Review of Systems  HENT:  Positive for congestion and sore throat.   Respiratory:  Positive for cough and shortness of breath.   Gastrointestinal:  Positive for diarrhea.  Musculoskeletal:  Positive for myalgias.     Physical Exam Triage Vital Signs ED Triage Vitals  Encounter Vitals Group     BP 07/29/24 1310 130/80     Girls Systolic BP Percentile --      Girls Diastolic BP Percentile --      Boys Systolic BP Percentile --      Boys Diastolic BP Percentile --      Pulse Rate 07/29/24 1310 (!) 122     Resp 07/29/24 1310 16     Temp 07/29/24 1310 98.3 F (36.8 C)     Temp Source 07/29/24 1310 Oral     SpO2 07/29/24 1310 97 %     Weight 07/29/24 1307 230 lb 9.6 oz (104.6 kg)     Height --      Head Circumference --      Peak Flow --      Pain Score 07/29/24 1308 8     Pain Loc --      Pain Education --       Exclude from Growth Chart --    No data found.  Updated Vital Signs BP 130/80 (BP Location: Left Arm)   Pulse (!) 122   Temp 98.3 F (36.8 C) (Oral)   Resp 16   Wt 230 lb 9.6 oz (104.6 kg)   SpO2 97%   BMI 34.05 kg/m   Visual Acuity Right Eye Distance:   Left Eye Distance:   Bilateral Distance:    Right Eye Near:   Left Eye Near:    Bilateral Near:     Physical Exam Vitals and nursing note reviewed.  Constitutional:      General: He is not in acute distress.    Appearance: Normal appearance. He is not ill-appearing or toxic-appearing.  HENT:     Head: Normocephalic and atraumatic.     Right Ear: Tympanic membrane and ear canal normal.     Left Ear: Tympanic membrane and ear canal normal.     Nose: Congestion present.     Mouth/Throat:     Mouth: Mucous membranes are moist.     Pharynx: Posterior oropharyngeal erythema present.  Eyes:     Pupils: Pupils are equal, round, and reactive to light.  Cardiovascular:     Rate and Rhythm: Normal rate and regular rhythm.     Heart sounds: Normal heart sounds.     Comments: Heart rate recheck 96 Pulmonary:     Effort: Pulmonary effort is normal.     Breath sounds: Normal breath sounds. No wheezing, rhonchi or rales.  Musculoskeletal:     Cervical back: Normal range of motion and neck supple.  Lymphadenopathy:     Cervical: No cervical adenopathy.  Skin:    General: Skin is warm and dry.  Neurological:     General: No focal deficit present.     Mental Status: He is alert and oriented to person, place, and time.  Psychiatric:        Mood and Affect: Mood normal.        Behavior: Behavior normal.      UC Treatments / Results  Labs (all labs ordered are listed, but only abnormal results are  displayed) Labs Reviewed - No data to display      suggestion  Information displayed in this report may not trend or trigger automated decision support.     Contains abnormal data Comprehensive Metabolic Panel  (CMP) Order: [id number] Component Ref Range & Units 3 mo ago  Glucose 70 - 110 mg/dL 843 High   Sodium 863 - 145 mmol/L 139  Potassium 3.6 - 5.1 mmol/L 5.1  Chloride 97 - 109 mmol/L 103  Carbon Dioxide (CO2) 22.0 - 32.0 mmol/L 25.0  Urea Nitrogen (BUN) 7 - 25 mg/dL 16  Creatinine 0.7 - 1.3 mg/dL 1.1  Glomerular Filtration Rate (eGFR) >60 mL/min/1.73sq m 76  Comment: CKD-EPI (2021) does not include patient's race in the calculation of eGFR.  Monitoring changes of plasma creatinine and eGFR over time is useful for monitoring kidney function.  Interpretive Ranges for eGFR (CKD-EPI 2021):  eGFR:       >60 mL/min/1.73 sq. m - Normal eGFR:       30-59 mL/min/1.73 sq. m - Moderately Decreased eGFR:       15-29 mL/min/1.73 sq. m  - Severely Decreased eGFR:       < 15 mL/min/1.73 sq. m  - Kidney Failure   Note: These eGFR calculations do not apply in acute situations when eGFR is changing rapidly or patients on dialysis.  Calcium  8.7 - 10.3 mg/dL 89.8  AST 8 - 39 U/L 22  ALT 6 - 57 U/L 33  Alk Phos (alkaline Phosphatase) 34 - 104 U/L 49  Albumin 3.5 - 4.8 g/dL 4.0  Bilirubin, Total 0.3 - 1.2 mg/dL 0.3  Protein, Total 6.1 - 7.9 g/dL 6.6  A/G Ratio 1.0 - 5.0 gm/dL 1.5  Resulting Agency [facility]   Specimen Collected: 04/26/24 08:42   Performed by: [facility] Last Resulted: 04/26/24 15:57   EKG   Radiology No results found.  Procedures Procedures (including critical care time)  Medications Ordered in UC Medications - No data to display  Initial Impression / Assessment and Plan / UC Course  I have reviewed the triage vital signs and the nursing notes.  Pertinent labs & imaging results that were available during my care of the patient were reviewed by me and considered in my medical decision making (see chart for details).     Reviewed exam and symptoms with patient.  Given close exposure to influenza and symptoms we will treat  with Tamiflu  twice daily for 5 days.  Will start prednisone  for his COPD symptoms and I did refill his albuterol  inhaler.  He declines cough medicine will use OTC cough medicine as needed.  Advised rest fluids and PCP follow-up 2 days for recheck.  Strict ER precautions reviewed and patient verbalized understanding. Final Clinical Impressions(s) / UC Diagnoses   Final diagnoses:  Influenza-like illness  Exposure to influenza  Chronic obstructive pulmonary disease, unspecified COPD type (HCC)  COPD, mild (HCC)     Discharge Instructions      Start Tamiflu  antiviral medication given your symptoms and close exposure to the flu.  I have refilled your albuterol  inhaler to use as needed for wheezing or shortness of breath.  Start prednisone  daily for 5 days for your COPD symptoms.  Lots of rest and fluids.  Follow-up with your PCP in 2 days for recheck.  Please go to the emergency room for any worsening symptoms.  I hope you feel better soon!     ED Prescriptions  Medication Sig Dispense Auth. Provider   oseltamivir  (TAMIFLU ) 75 MG capsule Take 1 capsule (75 mg total) by mouth every 12 (twelve) hours. 10 capsule Ebony Rickel, Jodi R, NP   predniSONE  (DELTASONE ) 20 MG tablet Take 2 tablets (40 mg total) by mouth daily with breakfast for 5 days. 10 tablet Lealon Vanputten, Jodi R, NP   albuterol  (VENTOLIN  HFA) 108 (90 Base) MCG/ACT inhaler Inhale 1-2 puffs into the lungs every 6 (six) hours as needed for wheezing or shortness of breath. 1 each Loreda Myla SAUNDERS, NP      PDMP not reviewed this encounter.     [1]  Social History Tobacco Use   Smoking status: Some Days    Current packs/day: 10.00    Average packs/day: 10.0 packs/day for 0.5 years (5.0 ttl pk-yrs)    Types: Cigarettes    Passive exposure: Current   Smokeless tobacco: Never  Vaping Use   Vaping status: Never Used  Substance Use Topics   Alcohol use: Yes    Comment: occasionally   Drug use: No     Loreda Myla SAUNDERS, NP 07/29/24 1328  "

## 2025-03-07 ENCOUNTER — Ambulatory Visit (INDEPENDENT_AMBULATORY_CARE_PROVIDER_SITE_OTHER): Admitting: Vascular Surgery

## 2025-03-07 ENCOUNTER — Encounter (INDEPENDENT_AMBULATORY_CARE_PROVIDER_SITE_OTHER)
